# Patient Record
Sex: Female | Born: 1960 | Race: White | Hispanic: Yes | Marital: Married | Smoking: Never smoker
Health system: Southern US, Community
[De-identification: ages and names within clinical notes are randomized; demographics above are authoritative.]

## PROBLEM LIST (undated history)

## (undated) DIAGNOSIS — K219 Gastro-esophageal reflux disease without esophagitis: Secondary | ICD-10-CM

## (undated) DIAGNOSIS — D649 Anemia, unspecified: Secondary | ICD-10-CM

## (undated) DIAGNOSIS — Z531 Procedure and treatment not carried out because of patient's decision for reasons of belief and group pressure: Secondary | ICD-10-CM

## (undated) DIAGNOSIS — G709 Myoneural disorder, unspecified: Secondary | ICD-10-CM

## (undated) DIAGNOSIS — R112 Nausea with vomiting, unspecified: Secondary | ICD-10-CM

## (undated) DIAGNOSIS — IMO0001 Reserved for inherently not codable concepts without codable children: Secondary | ICD-10-CM

## (undated) DIAGNOSIS — E119 Type 2 diabetes mellitus without complications: Secondary | ICD-10-CM

## (undated) DIAGNOSIS — I10 Essential (primary) hypertension: Secondary | ICD-10-CM

## (undated) DIAGNOSIS — T7840XA Allergy, unspecified, initial encounter: Secondary | ICD-10-CM

## (undated) DIAGNOSIS — Z9889 Other specified postprocedural states: Secondary | ICD-10-CM

## (undated) DIAGNOSIS — E785 Hyperlipidemia, unspecified: Secondary | ICD-10-CM

## (undated) HISTORY — DX: Nausea with vomiting, unspecified: Z98.890

## (undated) HISTORY — DX: Essential (primary) hypertension: I10

## (undated) HISTORY — DX: Anemia, unspecified: D64.9

## (undated) HISTORY — DX: Reserved for inherently not codable concepts without codable children: IMO0001

## (undated) HISTORY — PX: COLONOSCOPY: SHX174

## (undated) HISTORY — PX: APPENDECTOMY: SHX54

## (undated) HISTORY — DX: Other specified postprocedural states: R11.2

## (undated) HISTORY — DX: Gastro-esophageal reflux disease without esophagitis: K21.9

## (undated) HISTORY — DX: Myoneural disorder, unspecified: G70.9

## (undated) HISTORY — DX: Allergy, unspecified, initial encounter: T78.40XA

## (undated) HISTORY — DX: Procedure and treatment not carried out because of patient's decision for reasons of belief and group pressure: Z53.1

## (undated) HISTORY — PX: POLYPECTOMY: SHX149

## (undated) HISTORY — DX: Hyperlipidemia, unspecified: E78.5

---

## 2007-04-24 ENCOUNTER — Emergency Department (HOSPITAL_COMMUNITY): Admission: EM | Admit: 2007-04-24 | Discharge: 2007-04-24 | Payer: Self-pay | Admitting: Emergency Medicine

## 2007-08-07 ENCOUNTER — Ambulatory Visit (HOSPITAL_COMMUNITY): Admission: RE | Admit: 2007-08-07 | Discharge: 2007-08-07 | Payer: Self-pay | Admitting: Internal Medicine

## 2009-09-07 ENCOUNTER — Ambulatory Visit (HOSPITAL_COMMUNITY): Admission: RE | Admit: 2009-09-07 | Discharge: 2009-09-07 | Payer: Self-pay | Admitting: Geriatric Medicine

## 2010-07-04 ENCOUNTER — Other Ambulatory Visit: Payer: Self-pay | Admitting: Obstetrics and Gynecology

## 2010-07-04 ENCOUNTER — Encounter (INDEPENDENT_AMBULATORY_CARE_PROVIDER_SITE_OTHER): Payer: Self-pay | Admitting: Obstetrics and Gynecology

## 2010-07-04 DIAGNOSIS — N949 Unspecified condition associated with female genital organs and menstrual cycle: Secondary | ICD-10-CM

## 2010-07-05 NOTE — Progress Notes (Signed)
NAME:  Carmen Burns, Carmen Burns NO.:  [id number]  MEDICAL RECORD NO.:  [id number]           PATIENT TYPE:  A  LOCATION:  [facility]                   FACILITY:  [facility]  PHYSICIAN:  Argentina Donovan, MD        DATE OF BIRTH:  [birth year]  DATE OF SERVICE:  07/04/2010                                 CLINIC NOTE  The patient is a 50 year old gravida 5, para 3-0-2-3, youngest child is 79.  Over the past 6 years, she has had increasingly heavy periods.  No bleeding in between the periods but she does pass clots and they last for 6 days.  She had a recent Pap smear at the cancer center which was negative and they referred her over here because of the menorrhagia and the dysfunctional uterine bleeding.  She has no other medical complaints, so we are going to get a CBC on her, get an ultrasound and then probably treat her hormonally.  She is pretty close to menopause at the present time.  IMPRESSION:  Dysfunctional uterine bleeding, perimenopausal, normal Pap smears, rule out anemia.          ______________________________ Argentina Donovan, MD    PR/MEDQ  D:  07/04/2010  T:  07/05/2010  Job:  [id number]

## 2010-07-09 ENCOUNTER — Ambulatory Visit (HOSPITAL_COMMUNITY)
Admission: RE | Admit: 2010-07-09 | Discharge: 2010-07-09 | Disposition: A | Discharge status: Home / Self Care | Payer: Self-pay | Source: Ambulatory Visit | Attending: Obstetrics and Gynecology | Admitting: Obstetrics and Gynecology

## 2010-07-09 ENCOUNTER — Ambulatory Visit (HOSPITAL_COMMUNITY): Payer: Self-pay

## 2010-07-09 DIAGNOSIS — N938 Other specified abnormal uterine and vaginal bleeding: Secondary | ICD-10-CM | POA: Insufficient documentation

## 2010-07-09 DIAGNOSIS — N949 Unspecified condition associated with female genital organs and menstrual cycle: Secondary | ICD-10-CM | POA: Insufficient documentation

## 2010-07-19 ENCOUNTER — Ambulatory Visit (INDEPENDENT_AMBULATORY_CARE_PROVIDER_SITE_OTHER): Payer: Self-pay | Admitting: Obstetrics and Gynecology

## 2010-07-19 DIAGNOSIS — N949 Unspecified condition associated with female genital organs and menstrual cycle: Secondary | ICD-10-CM

## 2010-07-19 DIAGNOSIS — L0293 Carbuncle, unspecified: Secondary | ICD-10-CM

## 2010-07-19 DIAGNOSIS — L0292 Furuncle, unspecified: Secondary | ICD-10-CM

## 2010-07-20 NOTE — Group Therapy Note (Signed)
NAME:  Carmen Burns, Carmen Burns NO.:  [id number]  MEDICAL RECORD NO.:  [id number]           PATIENT TYPE:  A  LOCATION:  [facility]                   FACILITY:  [facility]  PHYSICIAN:  Argentina Donovan, MD        DATE OF BIRTH:  1960-06-07  DATE OF SERVICE:  07/19/2010                                 CLINIC NOTE  This a followup visit on the patient who had been sent by the [facility] because of heavy bleeding.  She had had long periods of heavy bleeding, 6 days every month prolonged period of time.  An ultrasound showed that she had adenomyosis.  No sign of any fibroids and hemoglobin turned out to be 7.1.  We placed the patient on iron which she never picked up, said she never gotten in reach of the prescription.  We have copies of the prescription as well as the notice that she was sent a certified letter that we do not have the report back yet, so we impressed on her and her son here that it was necessary that she start on iron therapy.  We are also going to start her on Depo-Provera to see whether that will control the bleeding and she is so close to menopause. She is not bleeding between periods but the periods are still very very heavy.  If that works, I am going to have her come back in a couple months and see and then we will continue giving her the Depo-Provera if we can buy time to menopause.  It also gives a chance to evaluate the amount of hemoglobin; however, her hemoglobin has improved over the period time.  If not, I told her if she starts continual bleeding during the time she has had the Depo-Provera and I have discussed with her son, to bring her back in here earlier so that we can schedule some type of surgical procedure to control the problem.  IMPRESSION:  Dysfunctional uterine bleeding with secondary anemia.  The endometrium is normal for someone who is premenopausal; however, I would do an endometrial biopsy if bleeding continues and in spite of the  Depo- Provera and before surgery scheduled.          ______________________________ Argentina Donovan, MD    PR/MEDQ  D:  07/19/2010  T:  07/20/2010  Job:  [id number]

## 2010-08-22 ENCOUNTER — Other Ambulatory Visit: Payer: Self-pay | Admitting: Obstetrics & Gynecology

## 2010-08-22 ENCOUNTER — Other Ambulatory Visit (HOSPITAL_COMMUNITY)
Admission: RE | Admit: 2010-08-22 | Discharge: 2010-08-22 | Disposition: A | Discharge status: Home / Self Care | Payer: Self-pay | Source: Ambulatory Visit | Attending: Obstetrics and Gynecology | Admitting: Obstetrics and Gynecology

## 2010-08-22 ENCOUNTER — Ambulatory Visit (INDEPENDENT_AMBULATORY_CARE_PROVIDER_SITE_OTHER): Payer: Self-pay | Admitting: Obstetrics & Gynecology

## 2010-08-22 DIAGNOSIS — N949 Unspecified condition associated with female genital organs and menstrual cycle: Secondary | ICD-10-CM

## 2010-08-22 DIAGNOSIS — N938 Other specified abnormal uterine and vaginal bleeding: Secondary | ICD-10-CM | POA: Insufficient documentation

## 2010-08-22 DIAGNOSIS — D649 Anemia, unspecified: Secondary | ICD-10-CM

## 2010-08-22 LAB — POCT PREGNANCY, URINE: Preg Test, Ur: NEGATIVE

## 2010-08-23 NOTE — Group Therapy Note (Signed)
NAME:  Carmen Burns, Carmen Burns             ACCOUNT NO.:  [id number]  MEDICAL RECORD NO.:  [id number]           PATIENT TYPE:  A  LOCATION:  [facility]                   [facility]ACILITY:  [facility]  PHYSICIAN:  Allie Bossier, MD        DATE OF BIRTH:  [birth year]  DATE OF SERVICE:  08/22/2010                                 CLINIC NOTE  Ms. Roselle Locus is a 50 year old Hispanic G5, P3-0-2-3 whose younger child is 48 years of age.  She was initially referred [facility].  At that time, she told them that she had very heavy bleeding.  An ultrasound was done after a hemoglobin was 7.1.  The ultrasound showed the uterus measuring 11.4 x 7.7 x 8.4 cm, it is enlarged and globular and is consistent with adenomyosis.  There were no fibroids noted.  The endometrium is measuring 8.8 mm.  She had her hemoglobin repeated on July 19, 2010, and it was stable at 7.0.  She was given an injection of Depo-Provera at that time.  She tells me today, a month later, that her bleeding has essentially stopped, so today I have done an endometrial biopsy.  I prepped her cervix with iodine, grasped the anterior lip of the cervix with single-tooth tenaculum, and passed the [place] for 2 passes getting a large amount of tissue each time.  She tolerated the procedure well and there was no bleeding once I removed the tenaculum.  I will plan to have her come back in 2 weeks for her results.  If her bleeding remains stable and her endometrial biopsy is negative, I would continue Depo- Provera until menopause should come soon; however, if any other treatment will be based on the endometrial biopsy.     Allie Bossier, MD    MCD/MEDQ  D:  08/22/2010  T:  08/23/2010  Job:  [id number]

## 2010-09-07 ENCOUNTER — Ambulatory Visit (INDEPENDENT_AMBULATORY_CARE_PROVIDER_SITE_OTHER): Payer: Self-pay | Admitting: Obstetrics & Gynecology

## 2010-09-07 DIAGNOSIS — N920 Excessive and frequent menstruation with regular cycle: Secondary | ICD-10-CM

## 2010-09-08 NOTE — Group Therapy Note (Unsigned)
NAME:  Carmen Burns, Carmen Burns NO.:  [id number]  MEDICAL RECORD NO.:  [id number]           PATIENT TYPE:  A  LOCATION[facility]                   [facility]ACILITY[facility]  [facility]  PHYSICIAN:  Scheryl Darter, MD       DATE OF BIRTH:  1961-03-10  DATE OF SERVICE:  09/07/2010                                 CLINIC NOTE  The patient comes today for a followup for endometrial biopsy that was done on Aug 22, 2010.  The patient is 50 years old gravida 6, para 3-1-2- 3.  Last menstrual period began May 24.  She had biopsy done on May 30. She says she still has some bleeding, but is not heavy.  She received Depo-Provera on April 26.  Endometrial biopsy showed benign inactive endometrium with pseudo-decidualized stroma.  Pap test on May 07, 2010 was negative.  I discussed the results of her ultrasound and biopsy.  As this seems to be minimal pathology except for dysfunctional bleeding due to perimenopausal, I think that is best to try to manage her bleeding with hormones.  She will continue with Depo-Provera 150 mg IM every 3 months.  May decide to discontinue this after a year of therapy to see whether she is menopausal at that time.  Because she is having some bleeding, I will try to help stop the bleeding by adding oral Provera 5 mg daily until it is time for her next injection.  She will notify us if her symptoms worsen.     Scheryl Darter, MD    JA/MEDQ  D:  09/07/2010  T:  09/08/2010  Job:  [id number]

## 2010-09-19 ENCOUNTER — Ambulatory Visit: Payer: Self-pay | Admitting: Obstetrics and Gynecology

## 2010-10-04 ENCOUNTER — Ambulatory Visit (INDEPENDENT_AMBULATORY_CARE_PROVIDER_SITE_OTHER): Payer: Self-pay | Admitting: Obstetrics and Gynecology

## 2010-10-04 VITALS — BP 158/80 | HR 86

## 2010-10-04 DIAGNOSIS — Z3042 Encounter for surveillance of injectable contraceptive: Secondary | ICD-10-CM

## 2010-10-04 DIAGNOSIS — Z3049 Encounter for surveillance of other contraceptives: Secondary | ICD-10-CM

## 2010-10-04 MED ORDER — MEDROXYPROGESTERONE ACETATE 150 MG/ML IM SUSP
150.0000 mg | Freq: Once | INTRAMUSCULAR | Status: AC
  Administered 2010-10-04: 150 mg via INTRAMUSCULAR

## 2010-10-04 NOTE — Progress Notes (Signed)
Pt here for depo provera.

## 2010-12-13 LAB — COMPREHENSIVE METABOLIC PANEL
ALT: 38 — ABNORMAL HIGH
AST: 84 — ABNORMAL HIGH
Albumin: 3.2 — ABNORMAL LOW
Alkaline Phosphatase: 125 — ABNORMAL HIGH
Calcium: 9.4
Creatinine, Ser: 1.03
GFR calc non Af Amer: 57 — ABNORMAL LOW

## 2010-12-13 LAB — URINE MICROSCOPIC-ADD ON

## 2010-12-13 LAB — DIFFERENTIAL
Basophils Absolute: 0.2 — ABNORMAL HIGH
Basophils Relative: 1
Eosinophils Absolute: 0.2
Lymphs Abs: 4.7 — ABNORMAL HIGH
Monocytes Absolute: 1
Neutrophils Relative %: 65

## 2010-12-13 LAB — URINALYSIS, ROUTINE W REFLEX MICROSCOPIC
Glucose, UA: NEGATIVE
Ketones, ur: NEGATIVE
Urobilinogen, UA: 0.2
pH: 5.5

## 2010-12-13 LAB — POCT PREGNANCY, URINE: Operator id: 294511

## 2010-12-13 LAB — OCCULT BLOOD X 1 CARD TO LAB, STOOL: Fecal Occult Bld: NEGATIVE

## 2010-12-13 LAB — LIPASE, BLOOD: Lipase: 22

## 2010-12-17 ENCOUNTER — Encounter: Payer: Self-pay | Admitting: *Deleted

## 2010-12-17 DIAGNOSIS — N938 Other specified abnormal uterine and vaginal bleeding: Secondary | ICD-10-CM | POA: Insufficient documentation

## 2010-12-17 DIAGNOSIS — D649 Anemia, unspecified: Secondary | ICD-10-CM | POA: Insufficient documentation

## 2010-12-17 DIAGNOSIS — E669 Obesity, unspecified: Secondary | ICD-10-CM

## 2010-12-20 ENCOUNTER — Encounter: Payer: Self-pay | Admitting: Obstetrics & Gynecology

## 2010-12-20 ENCOUNTER — Ambulatory Visit: Payer: Self-pay

## 2010-12-26 ENCOUNTER — Other Ambulatory Visit: Payer: Self-pay | Admitting: Obstetrics and Gynecology

## 2010-12-26 DIAGNOSIS — Z1231 Encounter for screening mammogram for malignant neoplasm of breast: Secondary | ICD-10-CM

## 2011-01-03 ENCOUNTER — Ambulatory Visit (HOSPITAL_COMMUNITY)
Admission: RE | Admit: 2011-01-03 | Discharge: 2011-01-03 | Disposition: A | Discharge status: Home / Self Care | Payer: Self-pay | Source: Ambulatory Visit | Attending: Obstetrics and Gynecology | Admitting: Obstetrics and Gynecology

## 2011-01-03 DIAGNOSIS — Z1231 Encounter for screening mammogram for malignant neoplasm of breast: Secondary | ICD-10-CM

## 2011-03-26 DIAGNOSIS — D649 Anemia, unspecified: Secondary | ICD-10-CM

## 2011-03-26 HISTORY — DX: Anemia, unspecified: D64.9

## 2012-04-30 ENCOUNTER — Ambulatory Visit: Payer: Self-pay | Admitting: Internal Medicine

## 2012-04-30 ENCOUNTER — Encounter (HOSPITAL_COMMUNITY): Payer: Self-pay

## 2012-04-30 ENCOUNTER — Inpatient Hospital Stay (HOSPITAL_COMMUNITY)
Admission: EM | Admit: 2012-04-30 | Discharge: 2012-05-01 | DRG: 812 | Disposition: A | Discharge status: Home / Self Care | Payer: MEDICAID | Attending: Family Medicine | Admitting: Family Medicine

## 2012-04-30 VITALS — BP 128/70 | HR 95 | Temp 98.1°F | Resp 18 | Ht 63.0 in | Wt 198.2 lb

## 2012-04-30 DIAGNOSIS — E119 Type 2 diabetes mellitus without complications: Secondary | ICD-10-CM

## 2012-04-30 DIAGNOSIS — D500 Iron deficiency anemia secondary to blood loss (chronic): Secondary | ICD-10-CM | POA: Diagnosis present

## 2012-04-30 DIAGNOSIS — N8003 Adenomyosis of the uterus: Secondary | ICD-10-CM

## 2012-04-30 DIAGNOSIS — E669 Obesity, unspecified: Secondary | ICD-10-CM

## 2012-04-30 DIAGNOSIS — R55 Syncope and collapse: Secondary | ICD-10-CM

## 2012-04-30 DIAGNOSIS — I10 Essential (primary) hypertension: Secondary | ICD-10-CM | POA: Diagnosis present

## 2012-04-30 DIAGNOSIS — D62 Acute posthemorrhagic anemia: Principal | ICD-10-CM

## 2012-04-30 DIAGNOSIS — Z880 Allergy status to penicillin: Secondary | ICD-10-CM

## 2012-04-30 DIAGNOSIS — Z79899 Other long term (current) drug therapy: Secondary | ICD-10-CM

## 2012-04-30 DIAGNOSIS — N938 Other specified abnormal uterine and vaginal bleeding: Secondary | ICD-10-CM

## 2012-04-30 DIAGNOSIS — R42 Dizziness and giddiness: Secondary | ICD-10-CM

## 2012-04-30 DIAGNOSIS — E611 Iron deficiency: Secondary | ICD-10-CM

## 2012-04-30 DIAGNOSIS — D509 Iron deficiency anemia, unspecified: Secondary | ICD-10-CM

## 2012-04-30 DIAGNOSIS — N920 Excessive and frequent menstruation with regular cycle: Secondary | ICD-10-CM | POA: Diagnosis present

## 2012-04-30 DIAGNOSIS — N8000 Endometriosis of the uterus, unspecified: Secondary | ICD-10-CM

## 2012-04-30 DIAGNOSIS — N949 Unspecified condition associated with female genital organs and menstrual cycle: Secondary | ICD-10-CM

## 2012-04-30 DIAGNOSIS — E785 Hyperlipidemia, unspecified: Secondary | ICD-10-CM | POA: Diagnosis present

## 2012-04-30 DIAGNOSIS — IMO0001 Reserved for inherently not codable concepts without codable children: Secondary | ICD-10-CM

## 2012-04-30 DIAGNOSIS — R531 Weakness: Secondary | ICD-10-CM

## 2012-04-30 DIAGNOSIS — K921 Melena: Secondary | ICD-10-CM

## 2012-04-30 DIAGNOSIS — Z531 Procedure and treatment not carried out because of patient's decision for reasons of belief and group pressure: Secondary | ICD-10-CM

## 2012-04-30 DIAGNOSIS — Z7189 Other specified counseling: Secondary | ICD-10-CM

## 2012-04-30 DIAGNOSIS — R110 Nausea: Secondary | ICD-10-CM

## 2012-04-30 DIAGNOSIS — D649 Anemia, unspecified: Secondary | ICD-10-CM

## 2012-04-30 HISTORY — DX: Type 2 diabetes mellitus without complications: E11.9

## 2012-04-30 LAB — POCT I-STAT, CHEM 8
Calcium, Ion: 1.23 mmol/L (ref 1.12–1.23)
Chloride: 107 mEq/L (ref 96–112)
Glucose, Bld: 151 mg/dL — ABNORMAL HIGH (ref 70–99)
HCT: 25 % — ABNORMAL LOW (ref 36.0–46.0)
TCO2: 21 mmol/L (ref 0–100)

## 2012-04-30 LAB — TYPE AND SCREEN
ABO/RH(D): O POS
Antibody Screen: NEGATIVE

## 2012-04-30 LAB — POCT CBC
Hemoglobin: 6.9 g/dL — AB (ref 12.2–16.2)
Lymph, poc: 3.6 — AB (ref 0.6–3.4)
MID (cbc): 0.7 (ref 0–0.9)
POC Granulocyte: 7.9 — AB (ref 2–6.9)
RDW, POC: 22.4 %
WBC: 12.2 10*3/uL — AB (ref 4.6–10.2)

## 2012-04-30 LAB — URINALYSIS, ROUTINE W REFLEX MICROSCOPIC
Bilirubin Urine: NEGATIVE
Leukocytes, UA: NEGATIVE
Nitrite: NEGATIVE
Specific Gravity, Urine: 1.006 (ref 1.005–1.030)
Urobilinogen, UA: 0.2 mg/dL (ref 0.0–1.0)

## 2012-04-30 LAB — POCT URINALYSIS DIPSTICK
Glucose, UA: NEGATIVE
Protein, UA: 30

## 2012-04-30 LAB — CBC WITH DIFFERENTIAL/PLATELET
Basophils Relative: 1 % (ref 0–1)
Eosinophils Absolute: 0.1 10*3/uL (ref 0.0–0.7)
Eosinophils Relative: 1 % (ref 0–5)
Hemoglobin: 6.3 g/dL — CL (ref 12.0–15.0)
Lymphs Abs: 2.9 10*3/uL (ref 0.7–4.0)
MCHC: 27.5 g/dL — ABNORMAL LOW (ref 30.0–36.0)
MCV: 63.6 fL — ABNORMAL LOW (ref 78.0–100.0)
Neutro Abs: 7.6 10*3/uL (ref 1.7–7.7)
Neutrophils Relative %: 67 % (ref 43–77)
Platelets: 473 10*3/uL — ABNORMAL HIGH (ref 150–400)
RDW: 21.1 % — ABNORMAL HIGH (ref 11.5–15.5)
WBC: 11.4 10*3/uL — ABNORMAL HIGH (ref 4.0–10.5)

## 2012-04-30 LAB — CBC
HCT: 20.3 % — ABNORMAL LOW (ref 36.0–46.0)
Hemoglobin: 5.8 g/dL — CL (ref 12.0–15.0)
MCH: 18.1 pg — ABNORMAL LOW (ref 26.0–34.0)
MCHC: 28.6 g/dL — ABNORMAL LOW (ref 30.0–36.0)
MCV: 63.4 fL — ABNORMAL LOW (ref 78.0–100.0)
Platelets: 408 K/uL — ABNORMAL HIGH (ref 150–400)
RBC: 3.2 MIL/uL — ABNORMAL LOW (ref 3.87–5.11)
RDW: 21.1 % — ABNORMAL HIGH (ref 11.5–15.5)
WBC: 11 K/uL — ABNORMAL HIGH (ref 4.0–10.5)

## 2012-04-30 LAB — IRON AND TIBC: TIBC: 482 ug/dL — ABNORMAL HIGH (ref 250–470)

## 2012-04-30 LAB — RETICULOCYTES
RBC.: 3.54 MIL/uL — ABNORMAL LOW (ref 3.87–5.11)
Retic Ct Pct: 2.8 % (ref 0.4–3.1)

## 2012-04-30 LAB — PROTIME-INR: INR: 1.09 (ref 0.00–1.49)

## 2012-04-30 LAB — NO BLOOD PRODUCTS: Transfuse no blood products: NEGATIVE

## 2012-04-30 LAB — TROPONIN I: Troponin I: <0.3 ng/mL

## 2012-04-30 LAB — URINE MICROSCOPIC-ADD ON

## 2012-04-30 MED ORDER — ESTROGENS CONJUGATED 25 MG IJ SOLR
25.0000 mg | Freq: Once | INTRAMUSCULAR | Status: DC
  Filled 2012-04-30: qty 25

## 2012-04-30 MED ORDER — FERROUS SULFATE 325 (65 FE) MG PO TABS
325.0000 mg | ORAL_TABLET | Freq: Every day | ORAL | Status: DC
  Administered 2012-05-01: 325 mg via ORAL
  Filled 2012-04-30 (×2): qty 1

## 2012-04-30 MED ORDER — ESTROGENS CONJUGATED 1.25 MG PO TABS
2.5000 mg | ORAL_TABLET | Freq: Four times a day (QID) | ORAL | Status: DC
  Filled 2012-04-30 (×2): qty 2

## 2012-04-30 MED ORDER — SODIUM CHLORIDE 0.9 % IJ SOLN
3.0000 mL | Freq: Two times a day (BID) | INTRAMUSCULAR | Status: DC
  Administered 2012-04-30: 3 mL via INTRAVENOUS

## 2012-04-30 MED ORDER — ADULT MULTIVITAMIN W/MINERALS CH
1.0000 | ORAL_TABLET | Freq: Every day | ORAL | Status: DC
  Administered 2012-05-01: 1 via ORAL
  Filled 2012-04-30: qty 1

## 2012-04-30 MED ORDER — PROMETHAZINE HCL 25 MG/ML IJ SOLN: 12.5000 mg | Freq: Four times a day (QID) | INTRAMUSCULAR | Status: DC | PRN

## 2012-04-30 MED ORDER — MEDROXYPROGESTERONE ACETATE 10 MG PO TABS
10.0000 mg | ORAL_TABLET | Freq: Every day | ORAL | Status: DC
  Administered 2012-05-01: 10 mg via ORAL
  Filled 2012-04-30: qty 1

## 2012-04-30 MED ORDER — CEPHALEXIN 500 MG PO CAPS
500.0000 mg | ORAL_CAPSULE | Freq: Three times a day (TID) | ORAL | Status: DC
  Administered 2012-05-01 (×3): 500 mg via ORAL
  Filled 2012-04-30 (×5): qty 1

## 2012-04-30 MED ORDER — SODIUM CHLORIDE 0.9 % IV SOLN
INTRAVENOUS | Status: DC
  Administered 2012-04-30 – 2012-05-01 (×2): via INTRAVENOUS

## 2012-04-30 NOTE — ED Notes (Signed)
Pt has been having weakness and dizziness for the past 4 days; pt states she had a syncopal episode at home today while she was taking a bath; "everything got dark and I past out for about a minute"; pt has a hx of GI blood and anemia; was seen at [place] today and sent here; hemoglobin 6.9 at [facility]; pt denying any pain;

## 2012-04-30 NOTE — Progress Notes (Signed)
CRITICAL VALUE ALERT  Critical value received:  Hgb 5.8   Date of notification:  Feb. 6 2014  Time of notification:  2335  Critical value read back:yes  Nurse who received alert:  Maximino Greenland  MD notified (1st page):  Berline Chough  Time of first page:  2335  MD notified (2nd page):  Time of second page:  Responding MD:  Berline Chough  Time MD responded:  2335

## 2012-04-30 NOTE — ED Notes (Signed)
Reg diet ordered spoke with Kenney Houseman

## 2012-04-30 NOTE — ED Provider Notes (Addendum)
History     CSN: [id number]  Arrival date & time 04/30/12  1531   First MD Initiated Contact with Patient 04/30/12 1549      Chief Complaint  Patient presents with  . Weakness  . Anemia    (Consider location/radiation/quality/duration/timing/severity/associated sxs/prior treatment) HPI Comments: 52 year old female presents with a complaint of being weak and dizzy for the past 4 days. Gradually getting worse, this morning while taking a shower when she stood up she became lightheaded and passed out in the shower. The symptoms are worse with standing, not associated with fevers chills nausea vomiting coughing or diarrhea. She was seen at the urgent care today, told that she needed to be further evaluated for possible GI bleed. 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  Patient is a 52 y.o. female presenting with weakness and anemia. The history is provided by the patient.  Weakness  Additional symptoms include weakness.  Anemia    Past Medical History  Diagnosis Date  . Hypertension   . Hyperlipidemia   . Diabetes mellitus without complication     History reviewed. No pertinent past surgical history.  Family History  Problem Relation Age of Onset  . Diabetes Brother   . Hypertension Brother     History  Substance Use Topics  . Smoking status: Never Smoker   . Smokeless tobacco: Not on file  . Alcohol Use: Not on file    OB History    Grav Para Term Preterm Abortions TAB SAB Ect Mult Living   5 3 3  0 2   0 0 3      Review of Systems  Neurological: Positive for weakness.  All other systems reviewed and are negative.    Allergies  Penicillins  Home Medications   Current Outpatient Rx  Name  Route  Sig  Dispense  Refill  . CEPHALEXIN 500 MG PO CAPS   Oral   Take 500 mg by mouth 3 (three) times daily. 10 day course filled 04/06/2012         . FERROUS SULFATE 325 (65 FE) MG PO  TABS   Oral   Take 325 mg by mouth daily with breakfast.         . GLIMEPIRIDE 2 MG PO TABS   Oral   Take 2 mg by mouth daily before breakfast.         . LISINOPRIL-HYDROCHLOROTHIAZIDE 20-25 MG PO TABS   Oral   Take 1 tablet by mouth daily.         . ADULT MULTIVITAMIN W/MINERALS CH   Oral   Take 1 tablet by mouth daily.         Marland Kitchen NAPROXEN SODIUM 220 MG PO TABS   Oral   Take 440 mg by mouth daily as needed. For pain           BP 130/57  Pulse 99  Temp 98.5 F (36.9 C) (Oral)  Resp 14  SpO2 100%  LMP 04/20/2012  Physical Exam  Nursing note and vitals reviewed. Constitutional: She appears well-developed and well-nourished. No distress.  HENT:  Head: Normocephalic and atraumatic.  Mouth/Throat: Oropharynx is clear and moist. No oropharyngeal exudate.  Eyes: EOM are normal. Pupils are equal, round, and reactive to light. Right eye exhibits no discharge. Left eye exhibits no discharge. No scleral icterus.       Pale conjunctiva  Neck: Normal range of motion.  Neck supple. No JVD present. No thyromegaly present.  Cardiovascular: Normal rate, regular rhythm, normal heart sounds and intact distal pulses.  Exam reveals no gallop and no friction rub.   No murmur heard. Pulmonary/Chest: Effort normal and breath sounds normal. No respiratory distress. She has no wheezes. She has no rales.  Abdominal: Soft. Bowel sounds are normal. She exhibits no distension and no mass. There is no tenderness.  Genitourinary:       Chaperone present for rectal exam, greenish brown-appearing stool, no gross blood, no fissures, no hemorrhoids, no masses palpated  Musculoskeletal: Normal range of motion. She exhibits no edema and no tenderness.  Lymphadenopathy:    She has no cervical adenopathy.  Neurological: She is alert. Coordination normal.  Skin: Skin is warm and dry. No rash noted. No erythema.  Psychiatric: She has a normal mood and affect. Her behavior is normal.    ED Course   Procedures (including critical care time)  Labs Reviewed  CBC WITH DIFFERENTIAL - Abnormal; Notable for the following:    WBC 11.4 (*)     RBC 3.60 (*)     Hemoglobin 6.3 (*)     HCT 22.9 (*)     MCV 63.6 (*)     MCH 17.5 (*)     MCHC 27.5 (*)     RDW 21.1 (*)     Platelets 473 (*)     All other components within normal limits  URINALYSIS, ROUTINE W REFLEX MICROSCOPIC - Abnormal; Notable for the following:    Hgb urine dipstick LARGE (*)     All other components within normal limits  POCT I-STAT, CHEM 8 - Abnormal; Notable for the following:    Glucose, Bld 151 (*)     Hemoglobin 8.5 (*)     HCT 25.0 (*)     All other components within normal limits  URINE MICROSCOPIC-ADD ON - Abnormal; Notable for the following:    Squamous Epithelial / LPF FEW (*)     All other components within normal limits  RETICULOCYTES - Abnormal; Notable for the following:    RBC. 3.54 (*)     All other components within normal limits  TYPE AND SCREEN  APTT  PROTIME-INR  VITAMIN B12  FOLATE  IRON AND TIBC  FERRITIN   No results found.   1. Anemia       MDM  Patient reexamined, Hemoccult is negative on my exam. Hemoglobin is been rechecked and is 6.3. The patient will require admission to the hospital for further anemic workup and evaluation. She is a Scientist, product/process development and refuses transfusion. Family practice physicians have been notified and will come to admit.  Otherwise the labs show a slight leukocytosis of 11,400, normal metabolic panel including renal function and electrolytes.      Vida Roller, MD 04/30/12 1743  Vida Roller, MD 04/30/12 [phone]

## 2012-04-30 NOTE — Progress Notes (Signed)
Subjective:    Patient ID: Carmen Burns, female    DOB: [birth year], 52 y.o.   MRN: [id number]  HPI Presents with nausea, dizzy, weakness, near syncope. On new meds from another practise for diabetes. Phx 2009 anemia and dysfunctional bleeding. Diabetes is controlled.  Review of Systems     Objective:   Physical Exam  Constitutional: She is oriented to person, place, and time. She appears well-developed and well-nourished. She appears distressed.  Eyes: EOM are normal. No scleral icterus.  Neck: Neck supple.  Cardiovascular: Normal pulses.  A regularly irregular rhythm present. Tachycardia present.   Pulmonary/Chest: Effort normal and breath sounds normal.  Abdominal: Soft. Bowel sounds are normal. She exhibits no mass. There is tenderness.  Musculoskeletal: Normal range of motion.  Neurological: She is alert and oriented to person, place, and time.  Psychiatric: She has a normal mood and affect.  Very pale. EKG sinus tachycardia  Results for orders placed in visit on 04/30/12  POCT CBC      Component Value Range   WBC 12.2 (*) 4.6 - 10.2 K/uL   Lymph, poc 3.6 (*) 0.6 - 3.4   POC LYMPH PERCENT 29.4  10 - 50 %L   MID (cbc) 0.7  0 - 0.9   POC MID % 5.6  0 - 12 %M   POC Granulocyte 7.9 (*) 2 - 6.9   Granulocyte percent 65.0  37 - 80 %G   RBC 3.98 (*) 4.04 - 5.48 M/uL   Hemoglobin 6.9 (*) 12.2 - 16.2 g/dL   HCT, POC 78.2 (*) 95.6 - 47.9 %   MCV 60.0 (*) 80 - 97 fL   MCH, POC 17.3 (*) 27 - 31.2 pg   MCHC 28.9 (*) 31.8 - 35.4 g/dL   RDW, POC 21.3     Platelet Count, POC 516 (*) 142 - 424 K/uL   MPV    0 - 99.8 fL  GLUCOSE, POCT (MANUAL RESULT ENTRY)      Component Value Range   POC Glucose 126 (*) 70 - 99 mg/dl  POCT GLYCOSYLATED HEMOGLOBIN (HGB A1C)      Component Value Range   Hemoglobin A1C 6.0     Results for orders placed in visit on 04/30/12  POCT CBC      Component Value Range   WBC 12.2 (*) 4.6 - 10.2 K/uL   Lymph, poc 3.6 (*) 0.6 - 3.4   POC LYMPH  PERCENT 29.4  10 - 50 %L   MID (cbc) 0.7  0 - 0.9   POC MID % 5.6  0 - 12 %M   POC Granulocyte 7.9 (*) 2 - 6.9   Granulocyte percent 65.0  37 - 80 %G   RBC 3.98 (*) 4.04 - 5.48 M/uL   Hemoglobin 6.9 (*) 12.2 - 16.2 g/dL   HCT, POC 08.6 (*) 57.8 - 47.9 %   MCV 60.0 (*) 80 - 97 fL   MCH, POC 17.3 (*) 27 - 31.2 pg   MCHC 28.9 (*) 31.8 - 35.4 g/dL   RDW, POC 46.9     Platelet Count, POC 516 (*) 142 - 424 K/uL   MPV    0 - 99.8 fL  GLUCOSE, POCT (MANUAL RESULT ENTRY)      Component Value Range   POC Glucose 126 (*) 70 - 99 mg/dl  POCT GLYCOSYLATED HEMOGLOBIN (HGB A1C)      Component Value Range   Hemoglobin A1C 6.0    POCT URINALYSIS DIPSTICK  Component Value Range   Color, UA amber     Clarity, UA cloudy     Glucose, UA neg     Bilirubin, UA small     Ketones, UA trace     Spec Grav, UA 1.015     Blood, UA large     pH, UA 5.5     Protein, UA 30     Urobilinogen, UA 0.2     Nitrite, UA neg     Leukocytes, UA Trace    IFOBT (OCCULT BLOOD)      Component Value Range   IFOBT Positive     Start IV NS/to ER     Assessment & Plan:  Severe anemia and blood in stool Refer to [facility] ER

## 2012-04-30 NOTE — H&P (Signed)
Family Medicine Teaching [facility] H&P Service Pager: [phone]  Patient name: Carmen Burns Medical record number: [id number] Date of birth: 02-Aug-1960 Age: 52 y.o. Gender: female  Primary Care Provider: Default, Provider, MD - Unassigned Consultants: None   CC  Syncope  HPI  Carmen Burns is a 52 y.o. year old female presenting with acute episode of syncope.  She was reportedly at home today and stood up from sitting and passed out for ~1 minute.  Her son took her to Urgent Care where she was found to have a hemoglobin of 6.3.  She reported having heavy menstrual bleeding X 2-3 years that seems to have worsened over the past 2-3 months and has most recently begun 4 days ago.  At the beginning of her period she reports having to change her pad every couple of minutes and is passing large clots.  Reports currently bleeding but has slowed significantly since 2 days ago. Initially patient indicated that her bleeding  was more significant however upon requestioning via interpreter and Dr. Mauricio Po patient did clarify that her bleeding has significantly slowed is almost stopped today on its own.  She has previously been worked up for this at [facility] and was told that she should wait until after menopause to have a hysterectomy.  She was on DEPO-Provera but has stopped ~ 1.5 years ago and has not been on hormone medications since that time.    ROS   Constitutional Fatigue, dizziness, no change in appetitie  Infectious No fevers, no chills  Resp No cough, no congestion, some shortness of breath associated with syncope episode  Cardiac Some chest tightness with syncope episode.  None now.  No palpitations, no LE swelling  GI No vomting, but some N with syncope.  No hematachezia or melana  GU No dysuria, no hematuria.  Heavy menses as above  MSK No focal weakness  Trauma Fall today, no reported trauma    HISTORY  PMHx:  Past Medical History  Diagnosis Date  . Hypertension   .  Hyperlipidemia   . Diabetes mellitus without complication     PSHx: History reviewed. No pertinent past surgical history.  Social Hx: History   Social History  . Marital Status: Married    Spouse Name: N/A    Number of Children: N/A  . Years of Education: N/A   Social History Main Topics  . Smoking status: Never Smoker   . Smokeless tobacco: None  . Alcohol Use: None  . Drug Use: None  . Sexually Active: None   Other Topics Concern  . None   Social History Narrative  . None    Family Hx: Family History  Problem Relation Age of Onset  . Diabetes Brother   . Hypertension Brother     Allergies: Allergies  Allergen Reactions  . Penicillins Rash    Home Medications: Prescriptions prior to admission  Medication Sig Dispense Refill  . cephALEXin (KEFLEX) 500 MG capsule Take 500 mg by mouth 3 (three) times daily. 10 day course filled 04/06/2012      . ferrous sulfate 325 (65 FE) MG tablet Take 325 mg by mouth daily with breakfast.      . glimepiride (AMARYL) 2 MG tablet Take 2 mg by mouth daily before breakfast.      . lisinopril-hydrochlorothiazide (PRINZIDE,ZESTORETIC) 20-25 MG per tablet Take 1 tablet by mouth daily.      . Multiple Vitamin (MULTIVITAMIN WITH MINERALS) TABS Take 1 tablet by mouth daily.      Marland Kitchen  naproxen sodium (ANAPROX) 220 MG tablet Take 440 mg by mouth daily as needed. For pain        OBJECTIVE  Vitals: Temp:  [98.1 F (36.7 C)-99.5 F (37.5 C)] 99.5 F (37.5 C) (02/06 2211) Pulse Rate:  [81-107] 86  (02/06 2330) Resp:  [14-25] 14  (02/06 2330) BP: (105-152)/(53-80) 131/70 mmHg (02/06 2330) SpO2:  [98 %-100 %] 99 % (02/06 2330) Weight:  [198 lb 3.2 oz (89.903 kg)-199 lb 4.7 oz (90.4 kg)] 199 lb 4.7 oz (90.4 kg) (02/06 2211)  Weight: Wt Readings from Last 3 Encounters:  04/30/12 199 lb 4.7 oz (90.4 kg)  04/30/12 198 lb 3.2 oz (89.903 kg)    I&Os: Yesterday:   This shift: Total I/O In: 18.8 [I.V.:18.8] Out: -    PE: GENERAL:   Adult Hispanic female. In no discomfort; no respiratory distress. PSYCH: Alert and appropriately interactive; Insight:Good   H&N: AT/[place], trachea midline EENT:  MMM, no scleral icterus, EOMi HEART: RRR, S1/S2 heard, no murmur LUNGS: CTA B, no wheezes, no crackles EXTREMITIES: Moves all 4 extremities spontaneously, warm well perfused, no edema, bilateral DP and PT pulses 2/4. PELVIC EXAM: External genitalia examined.  There is only as an dark blood on pad was removed.  There is no blood clot in the vaginal introitus.  Speculum and bmanual exam is deferred    LABS: CBC BMET   Lab 04/30/12 2259 04/30/12 1649 04/30/12 1613  WBC 11.0* -- 11.4*  HGB 5.8* 8.5* 6.3*  HCT 20.3* 25.0* 22.9*  PLT 408* -- 473*    Lab 04/30/12 1649  NA 139  K 3.7  CL 107  CO2 --  BUN 9  CREATININE 0.70  GLUCOSE 151*  CALCIUM --     URINE STUDIES:  04/30/2012 16:19  Color, Urine YELLOW  APPearance CLEAR  Specific Gravity, Urine 1.006  pH 6.0  Glucose NEGATIVE  Bilirubin Urine NEGATIVE  Ketones, ur NEGATIVE  Protein NEGATIVE  Urobilinogen, UA 0.2  Nitrite NEGATIVE  Leukocytes, UA NEGATIVE  Hgb urine dipstick LARGE (A)  WBC, UA 0-2  RBC / HPF 3-6  Squamous Epithelial / LPF FEW (A)  Bacteria, UA RARE   MICRO: None  IMAGING: No results found. No results found.   Medications:       . cephALEXin  500 mg Oral TID  . conjugated estrogens  25 mg Intravenous Once  . ferrous sulfate  325 mg Oral QAC breakfast  . medroxyPROGESTERone  10 mg Oral Daily  . multivitamin with minerals  1 tablet Oral Daily  . sodium chloride  3 mL Intravenous Q12H    Assessment & Plan  LOS: 31 52 y.o. year old female with heavy menstural periods over the past 2-3 years with worsening over the past 3 months who presented s/p syncopal episode.  Found to have a hemoglobin of 6.3 on presentation.  # Dysmenorrhea:   Discussed case with Dr. Debroah Loop @ [facility]: If patient or bleeding significantly Dr. Debroah Loop does recommend  IV Premarin however upon further clarification with the patient her bleeding has significantly slowed.   will give her one dose of Premarin 2.5 mg by mouth given that her bleeding is only minimal at this time    Start Provera 10mg  po daily until able to followup with [facility].  Currently BP appropriate  # Anemia: acute on chronic blood loss due to menorrhagia  Pt is [employer] and chooses not to accept blood products  Iron studies pending - Consider FeraHeme & Aranesp  Negative Hemoccult in ED, no indication for GI bleed   # Uterine Adenomyosis:  Seen on [place] in 06/2010  Repeating US  --- FEN  *NS @ 94ml/hr -Heart healthy --- PPx: SCDs, no heparin indicated  Disposition  Pt is to be admitted to SDU for close monitoring and stabilization of her bleeding.  If patient does begin to rebleed consider IV estrogen given o blood products can be administered.  Discussed case with Dr. Debroah Loop and Dr. Mauricio Po.  Initially patient was felt to have more bleeding than what was evident on exam and upon requestioning via interpreter and Dr. Mauricio Po patient did clarify that her bleeding has significantly slowed is almost stopped today on its own.  Plan for out patient stablization   Andrena Mews, DO [facility] Resident - PGY-1 04/30/2012 11:45 PM

## 2012-04-30 NOTE — ED Notes (Signed)
Hyacinth Meeker, MD notified re: critical hemoglobin of 6.3, primary RN notified

## 2012-04-30 NOTE — ED Notes (Signed)
Pt states she would not like a blood transfusion; would like to discuss other options with MD about hemoglobin;

## 2012-05-01 ENCOUNTER — Inpatient Hospital Stay (HOSPITAL_COMMUNITY): Payer: Self-pay

## 2012-05-01 DIAGNOSIS — D509 Iron deficiency anemia, unspecified: Secondary | ICD-10-CM

## 2012-05-01 DIAGNOSIS — D62 Acute posthemorrhagic anemia: Secondary | ICD-10-CM | POA: Diagnosis present

## 2012-05-01 DIAGNOSIS — N800 Endometriosis of uterus: Secondary | ICD-10-CM | POA: Diagnosis present

## 2012-05-01 DIAGNOSIS — N8003 Adenomyosis of the uterus: Secondary | ICD-10-CM | POA: Diagnosis present

## 2012-05-01 DIAGNOSIS — Z531 Procedure and treatment not carried out because of patient's decision for reasons of belief and group pressure: Secondary | ICD-10-CM

## 2012-05-01 DIAGNOSIS — E611 Iron deficiency: Secondary | ICD-10-CM | POA: Diagnosis present

## 2012-05-01 LAB — CBC
HCT: 20.3 % — ABNORMAL LOW (ref 36.0–46.0)
HCT: 21.8 % — ABNORMAL LOW (ref 36.0–46.0)
HCT: 20.9 % — ABNORMAL LOW (ref 36.0–46.0)
Hemoglobin: 5.7 g/dL — CL (ref 12.0–15.0)
Hemoglobin: 6.1 g/dL — CL (ref 12.0–15.0)
MCH: 18 pg — ABNORMAL LOW (ref 26.0–34.0)
MCHC: 28 g/dL — ABNORMAL LOW (ref 30.0–36.0)
MCV: 64.4 fL — ABNORMAL LOW (ref 78.0–100.0)
MCV: 65.3 fL — ABNORMAL LOW (ref 78.0–100.0)
Platelets: 389 10*3/uL (ref 150–400)
RBC: 3.15 MIL/uL — ABNORMAL LOW (ref 3.87–5.11)
RDW: 22.2 % — ABNORMAL HIGH (ref 11.5–15.5)
WBC: 10.3 10*3/uL (ref 4.0–10.5)
WBC: 8.7 10*3/uL (ref 4.0–10.5)

## 2012-05-01 LAB — VITAMIN B12: Vitamin B-12: 368 pg/mL (ref 211–911)

## 2012-05-01 LAB — HEMOGLOBIN A1C
Hgb A1c MFr Bld: 6.2 % — ABNORMAL HIGH (ref <5.7)
Mean Plasma Glucose: 131 mg/dL — ABNORMAL HIGH (ref <117)

## 2012-05-01 MED ORDER — MEDROXYPROGESTERONE ACETATE 10 MG PO TABS: 10.0000 mg | ORAL_TABLET | Freq: Every day | ORAL | Status: DC

## 2012-05-01 MED ORDER — ESTROGENS CONJUGATED 1.25 MG PO TABS
2.5000 mg | ORAL_TABLET | Freq: Once | ORAL | Status: AC
  Administered 2012-05-01: 2.5 mg via ORAL
  Filled 2012-05-01: qty 2

## 2012-05-01 MED ORDER — SODIUM CHLORIDE 0.9 % IV SOLN
1020.0000 mg | Freq: Once | INTRAVENOUS | Status: AC
  Administered 2012-05-01: 1020 mg via INTRAVENOUS
  Filled 2012-05-01: qty 34

## 2012-05-01 MED ORDER — ACETAMINOPHEN 325 MG PO TABS
650.0000 mg | ORAL_TABLET | Freq: Four times a day (QID) | ORAL | Status: DC | PRN
  Administered 2012-05-01: 650 mg via ORAL
  Filled 2012-05-01: qty 2

## 2012-05-01 MED ORDER — INFLUENZA VIRUS VACC SPLIT PF IM SUSP
0.5000 mL | Freq: Once | INTRAMUSCULAR | Status: AC
  Administered 2012-05-01: 0.5 mL via INTRAMUSCULAR
  Filled 2012-05-01: qty 0.5

## 2012-05-01 NOTE — Progress Notes (Signed)
Pt discharge to home with family, discharged instructions discussed and explained to family/pt. F/u appt and care notes in spanish given to family and pt. Prescription sent electronically to [facility]. Beryle Quant

## 2012-05-01 NOTE — H&P (Signed)
FMTS Attending Admit Note Patient seen and examined by me on day of admission (Apr 30, 2012 @10 :40pm), I discussed case with Dr Berline Chough and I agree with his assessment and plan for management.  Patient interview conducted in [place].  Briefly, a 51yoF admitted after syncopal event and found to be profoundly anemic, thought secondary to heavy uncontrolled uterine bleeding.  Patient reports that she has had very heavy menstrual cycles all her life, the most recent one which started on Apr 20, 2012 required her to change pads every three minutes until tapering off significantly in recent days. She states that in the past few days she has only had to use 2 pads a day and it appears to be slowing considerably.  She denies other sources of bleeding such as hematochezia or hematuria.  She has been seen in [facility] and had some workup done there.   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  She states that it would be her preference not to undergo hysterectomy but that she would accept this if it were GYN's thought to be medically necessary.  She has maintained stable hemodynamic status; plan to admit and administer oral estrogen therapy to further stop uterine bleeding.  Serial H/H checks.  Once stopped, may consider timely outpatient eval by GYN again.  I agree with Duncan Dull, and may consider coags and workup for vWD or other disorders of coagulation if not already done previously.  Paula Compton, MD

## 2012-05-01 NOTE — Progress Notes (Signed)
Utilization review completed.  

## 2012-05-01 NOTE — Progress Notes (Signed)
Seen, examined and discussed in rounds.  Agree with Dr. Sonnenberg's documentation and management. 

## 2012-05-01 NOTE — Progress Notes (Signed)
Patient ID: Jasmia Angst, female   DOB: 09/28/1960, 52 y.o.   MRN: [id number] Family Medicine Teaching Service Daily Progress Note Service Page: [phone]  Subjective: feeling better this morning. Less dizzy. Denies chest pain. Now only bleeding a small amount. States was on the keflex for an ingrown toenail. Has one day left of this medication.  Objective: Temp:  [98.1 F (36.7 C)-99.5 F (37.5 C)] 98.7 F (37.1 C) (02/07 1112) Pulse Rate:  [81-107] 85  (02/07 0716) Resp:  [14-25] 19  (02/07 0716) BP: (105-152)/(53-80) 141/67 mmHg (02/07 0716) SpO2:  [98 %-100 %] 100 % (02/07 0716) Weight:  [198 lb 3.2 oz (89.903 kg)-199 lb 4.7 oz (90.4 kg)] 199 lb 4.7 oz (90.4 kg) (02/06 2211) Exam: General: no acute distress Cardiovascular: rrr, no mrg Respiratory: CTAB Abdomen: soft, minimal tenderness to palpation over suprapubic region Extremities: no edema, sluggish cap refill, right big toe ingrown in medial aspect  CBC BMET   Lab 05/01/12 1022 05/01/12 0515 04/30/12 2259  WBC 8.4 10.3 11.0*  HGB 6.1* 5.7* 5.8*  HCT 21.8* 20.3* 20.3*  PLT 424* 389 408*    Lab 04/30/12 1649  NA 139  K 3.7  CL 107  CO2 --  BUN 9  CREATININE 0.70  GLUCOSE 151*  CALCIUM --     Iron 16.6 TIBC 482 Ferritin 27 A1c 6.2 B12 and folate wnl  Imaging/Diagnostic Tests: Transvaginal US: Findings compatible with adenomyosis resulting in nonvisualization  of the endometrium with this study. Non-visualized ovaries.  Thin walled unilocular left adnexal cyst appears stable since the  previous exam. Given the appearance and premenopausal status of  the patient, no further follow-up for this benign finding is  needed.     . cephALEXin  500 mg Oral TID  . medroxyPROGESTERone  10 mg Oral Daily  . multivitamin with minerals  1 tablet Oral Daily  . sodium chloride  3 mL Intravenous Q12H   acetaminophen, promethazine   Assessment/Plan: 52 y.o. year old female with heavy menstural periods over the  past 2-3 years with worsening over the past 3 months who presented s/p syncopal episode. Found to have a hemoglobin of 6.3 on presentation.  1. Dysmennorhea: has significantly slowed at this time. Likely related to adenomyosis seen on [place]. -patient received premarin and provera yesterday -will continue provera 10 mg PO daily -vaginal US revealed uterine adenomyosis -will need outpatient follow-up with gynecology  2. Anemia: Hgb stable today at 5.7. Iron deficiency with low iron, high TIBC, and low ferritin. 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 -will give fereheme 1020 mg today, can consider redosing this after 3-8 days with 510 dosing -will follow up CBC  3. DM: A1c 6.2 -holding home glimeperide  4. HTN: on lisinopril/HCTZ at home, given volume depletion will hold this medication as BP is normal range  FEN/GI: NS at 75 mL/hr, heart healthy diet Dispo: pending stabilization of hgb and completion of fereheme  Marikay Alar, MD 05/01/2012, 11:24 AM

## 2012-05-02 NOTE — Discharge Summary (Signed)
Physician Discharge Summary  Patient ID: Carmen Burns MRN: [id number] DOB: 1961/03/01 Age: 52 y.o.  Admit date: 04/30/2012 Discharge date: 05/02/2012 Admitting Physician: Barbaraann Barthel, MD  PCP: Default, Provider, MD  Consultants: none    Discharge Diagnosis: Principal Problem:   Acute blood loss anemia Active Problems:   Uterus, adenomyosis   Refusal of blood transfusions as patient is Jehovah's Witness   Iron deficiency    Hospital Course 52 y.o. year old female with heavy menstural periods over the past 2-3 years with worsening over the past 3 months who presented s/p syncopal episode. Found to have a hemoglobin of 6.3 on presentation.   1. Dysmenorrhea: at the time of presentation patients bleeding had slowed significantly. Presented following an acute syncopal event following standing up from sitting. Case was discussed with Dr. Debroah Loop at [facility] who advised treatment with premarin and provera. Her Hgb on admission was 6.3. Hgb remained stable at 5.9 at time of discharge. Patient was given dose of premarin 2.5 mg and started on provera 10 mg daily. Pelvic US revealed adenomyosis. Was given a dose of fereheme prior to discharge as iron studies showed iron deficiency anemia with low iron, elevated TIBC, and low ferritin. Advised to follow-up as outpatient with [facility].  2. Anemia: Hgb remained stable throughout hospitalization. Appears to be iron deficient. Patient is a Jehovah's witness and declines blood products. Treatment as outlined above.  3. DM: A1c 6.2, held home glimeperide.   4. HTN: on lisinopril/HCTZ at home, given volume depletion held this medication as BP is normal range  Problem List 1. Dysmenorrhea  2. Adenomyosis 3. Iron deficiency anemia 4. DM 5. HTN         Discharge PE   Filed Vitals:   05/01/12 1500  BP: 130/60  Pulse: 91  Temp: 97.9 F (36.6 C)  Resp: 16   General: no acute distress  Cardiovascular: rrr, no mrg  Respiratory: CTAB   Abdomen: soft, minimal tenderness to palpation over suprapubic region  Extremities: no edema, sluggish cap refill, right big toe ingrown in medial aspect   Procedures/Imaging:  US Transvaginal Non-ob  05/01/2012  IMPRESSION: Findings compatible with adenomyosis resulting in nonvisualization of the endometrium with this study.  Non-visualized ovaries.  Thin walled unilocular left adnexal cyst appears stable since the previous exam.  Given the appearance and premenopausal status of the patient, no further follow-up for this benign finding is needed.   Original Report Authenticated By: Rhodia Albright, M.D.    Labs  CBC  Recent Labs Lab 05/01/12 0515 05/01/12 1022 05/01/12 1620  WBC 10.3 8.4 8.7  HGB 5.7* 6.1* 5.9*  HCT 20.3* 21.8* 20.9*  PLT 389 424* 403*   BMET  Recent Labs Lab 04/30/12 1649  NA 139  K 3.7  CL 107  BUN 9  CREATININE 0.70  GLUCOSE 151*   Results for orders placed during the hospital encounter of 04/30/12 (from the past 72 hour(s))  CBC WITH DIFFERENTIAL     Status: Abnormal   Collection Time    04/30/12  4:13 PM      Result Value Range   WBC 11.4 (*) 4.0 - 10.5 K/uL   RBC 3.60 (*) 3.87 - 5.11 MIL/uL   Hemoglobin 6.3 (*) 12.0 - 15.0 g/dL   Comment: REPEATED TO VERIFY     CRITICAL RESULT CALLED TO, READ BACK BY AND VERIFIED WITHNoralee Stain RN 1705 04/30/12 A BROWNING   HCT 22.9 (*) 36.0 - 46.0 %  MCV 63.6 (*) 78.0 - 100.0 fL   MCH 17.5 (*) 26.0 - 34.0 pg   MCHC 27.5 (*) 30.0 - 36.0 g/dL   RDW 47.8 (*) 29.5 - 62.1 %   Platelets 473 (*) 150 - 400 K/uL   Neutrophils Relative 67  43 - 77 %   Lymphocytes Relative 25  12 - 46 %   Monocytes Relative 6  3 - 12 %   Eosinophils Relative 1  0 - 5 %   Basophils Relative 1  0 - 1 %   Neutro Abs 7.6  1.7 - 7.7 K/uL   Lymphs Abs 2.9  0.7 - 4.0 K/uL   Monocytes Absolute 0.7  0.1 - 1.0 K/uL   Eosinophils Absolute 0.1  0.0 - 0.7 K/uL   Basophils Absolute 0.1  0.0 - 0.1 K/uL   RBC Morphology POLYCHROMASIA  PRESENT     Comment: ELLIPTOCYTES  URINALYSIS, ROUTINE W REFLEX MICROSCOPIC     Status: Abnormal   Collection Time    04/30/12  4:19 PM      Result Value Range   Color, Urine YELLOW  YELLOW   APPearance CLEAR  CLEAR   Specific Gravity, Urine 1.006  1.005 - 1.030   pH 6.0  5.0 - 8.0   Glucose, UA NEGATIVE  NEGATIVE mg/dL   Hgb urine dipstick LARGE (*) NEGATIVE   Bilirubin Urine NEGATIVE  NEGATIVE   Ketones, ur NEGATIVE  NEGATIVE mg/dL   Protein, ur NEGATIVE  NEGATIVE mg/dL   Urobilinogen, UA 0.2  0.0 - 1.0 mg/dL   Nitrite NEGATIVE  NEGATIVE   Leukocytes, UA NEGATIVE  NEGATIVE  URINE MICROSCOPIC-ADD ON     Status: Abnormal   Collection Time    04/30/12  4:19 PM      Result Value Range   Squamous Epithelial / LPF FEW (*) RARE   WBC, UA 0-2  <3 WBC/hpf   RBC / HPF 3-6  <3 RBC/hpf   Bacteria, UA RARE  RARE  TYPE AND SCREEN     Status: None   Collection Time    04/30/12  4:25 PM      Result Value Range   ABO/RH(D) O POS     Antibody Screen NEG     Sample Expiration 05/03/2012    POCT I-STAT, CHEM 8     Status: Abnormal   Collection Time    04/30/12  4:49 PM      Result Value Range   Sodium 139  135 - 145 mEq/L   Potassium 3.7  3.5 - 5.1 mEq/L   Chloride 107  96 - 112 mEq/L   BUN 9  6 - 23 mg/dL   Creatinine, Ser 3.08  0.50 - 1.10 mg/dL   Glucose, Bld 657 (*) 70 - 99 mg/dL   Calcium, Ion 8.46  9.62 - 1.23 mmol/L   TCO2 21  0 - 100 mmol/L   Hemoglobin 8.5 (*) 12.0 - 15.0 g/dL   HCT 95.2 (*) 84.1 - 32.4 %  APTT     Status: None   Collection Time    04/30/12  5:09 PM      Result Value Range   aPTT 28  24 - 37 seconds  PROTIME-INR     Status: None   Collection Time    04/30/12  5:09 PM      Result Value Range   Prothrombin Time 14.0  11.6 - 15.2 seconds   INR 1.09  0.00 - 1.49  VITAMIN B12  Status: None   Collection Time    04/30/12  5:09 PM      Result Value Range   Vitamin B-12 368  211 - 911 pg/mL  FOLATE     Status: None   Collection Time    04/30/12  5:09  PM      Result Value Range   Folate 19.4     Comment: (NOTE)     Reference Ranges            Deficient:       0.4 - 3.3 ng/mL            Indeterminate:   3.4 - 5.4 ng/mL            Normal:              > 5.4 ng/mL  IRON AND TIBC     Status: Abnormal   Collection Time    04/30/12  5:09 PM      Result Value Range   Iron 16 (*) 42 - 135 ug/dL   TIBC 981 (*) 191 - 478 ug/dL   Saturation Ratios 3 (*) 20 - 55 %   UIBC 466 (*) 125 - 400 ug/dL  FERRITIN     Status: None   Collection Time    04/30/12  5:09 PM      Result Value Range   Ferritin 27  10 - 291 ng/mL  RETICULOCYTES     Status: Abnormal   Collection Time    04/30/12  5:09 PM      Result Value Range   Retic Ct Pct 2.8  0.4 - 3.1 %   RBC. 3.54 (*) 3.87 - 5.11 MIL/uL   Retic Count, Manual 99.1  19.0 - 186.0 K/uL  NO BLOOD PRODUCTS     Status: None   Collection Time    04/30/12  6:46 PM      Result Value Range   Transfuse no blood products       Value: TRANSFUSE NO BLOOD PRODUCTS, VERIFIED BY JANELLE NEGRON RIVERA,RN  MRSA PCR SCREENING     Status: None   Collection Time    04/30/12 10:40 PM      Result Value Range   MRSA by PCR NEGATIVE  NEGATIVE   Comment:            The GeneXpert MRSA Assay (FDA     approved for NASAL specimens     only), is one component of a     comprehensive MRSA colonization     surveillance program. It is not     intended to diagnose MRSA     infection nor to guide or     monitor treatment for     MRSA infections.  TSH     Status: None   Collection Time    04/30/12 10:59 PM      Result Value Range   TSH 3.359  0.350 - 4.500 uIU/mL  TROPONIN I     Status: None   Collection Time    04/30/12 10:59 PM      Result Value Range   Troponin I <0.30  <0.30 ng/mL   Comment:            Due to the release kinetics of cTnI,     a negative result within the first hours     of the onset of symptoms does not rule out     myocardial infarction with certainty.  If myocardial infarction is still  suspected,     repeat the test at appropriate intervals.  HEMOGLOBIN A1C     Status: Abnormal   Collection Time    04/30/12 10:59 PM      Result Value Range   Hemoglobin A1C 6.2 (*) <5.7 %   Comment: (NOTE)                                                                               According to the ADA Clinical Practice Recommendations for 2011, when     HbA1c is used as a screening test:      >=6.5%   Diagnostic of Diabetes Mellitus               (if abnormal result is confirmed)     5.7-6.4%   Increased risk of developing Diabetes Mellitus     References:Diagnosis and Classification of Diabetes Mellitus,Diabetes     Care,2011,34(Suppl 1):S62-S69 and Standards of Medical Care in             Diabetes - 2011,Diabetes Care,2011,34 (Suppl 1):S11-S61.   Mean Plasma Glucose 131 (*) <117 mg/dL  CBC     Status: Abnormal   Collection Time    04/30/12 10:59 PM      Result Value Range   WBC 11.0 (*) 4.0 - 10.5 K/uL   RBC 3.20 (*) 3.87 - 5.11 MIL/uL   Hemoglobin 5.8 (*) 12.0 - 15.0 g/dL   Comment: REPEATED TO VERIFY     CRITICAL RESULT CALLED TO, READ BACK BY AND VERIFIED WITH:     B MUSIAL,RN 2333 04/30/12 D BRADLEY   HCT 20.3 (*) 36.0 - 46.0 %   MCV 63.4 (*) 78.0 - 100.0 fL   MCH 18.1 (*) 26.0 - 34.0 pg   MCHC 28.6 (*) 30.0 - 36.0 g/dL   RDW 16.1 (*) 09.6 - 04.5 %   Platelets 408 (*) 150 - 400 K/uL  TROPONIN I     Status: None   Collection Time    05/01/12  5:15 AM      Result Value Range   Troponin I <0.30  <0.30 ng/mL   Comment:            Due to the release kinetics of cTnI,     a negative result within the first hours     of the onset of symptoms does not rule out     myocardial infarction with certainty.     If myocardial infarction is still suspected,     repeat the test at appropriate intervals.  CBC     Status: Abnormal   Collection Time    05/01/12  5:15 AM      Result Value Range   WBC 10.3  4.0 - 10.5 K/uL   RBC 3.15 (*) 3.87 - 5.11 MIL/uL   Hemoglobin 5.7 (*)  12.0 - 15.0 g/dL   Comment: CRITICAL VALUE NOTED.  VALUE IS CONSISTENT WITH PREVIOUSLY REPORTED AND CALLED VALUE.   HCT 20.3 (*) 36.0 - 46.0 %   MCV 64.4 (*) 78.0 - 100.0 fL   MCH 18.1 (*) 26.0 - 34.0 pg   MCHC 28.1 (*) 30.0 - 36.0  g/dL   RDW 16.1 (*) 09.6 - 04.5 %   Platelets 389  150 - 400 K/uL  CBC     Status: Abnormal   Collection Time    05/01/12 10:22 AM      Result Value Range   WBC 8.4  4.0 - 10.5 K/uL   RBC 3.39 (*) 3.87 - 5.11 MIL/uL   Hemoglobin 6.1 (*) 12.0 - 15.0 g/dL   Comment: REPEATED TO VERIFY     CRITICAL VALUE NOTED.  VALUE IS CONSISTENT WITH PREVIOUSLY REPORTED AND CALLED VALUE.   HCT 21.8 (*) 36.0 - 46.0 %   MCV 64.3 (*) 78.0 - 100.0 fL   MCH 18.0 (*) 26.0 - 34.0 pg   MCHC 28.0 (*) 30.0 - 36.0 g/dL   RDW 40.9 (*) 81.1 - 91.4 %   Platelets 424 (*) 150 - 400 K/uL   Comment: PLATELET COUNT CONFIRMED BY SMEAR  TROPONIN I     Status: None   Collection Time    05/01/12 10:24 AM      Result Value Range   Troponin I <0.30  <0.30 ng/mL   Comment:            Due to the release kinetics of cTnI,     a negative result within the first hours     of the onset of symptoms does not rule out     myocardial infarction with certainty.     If myocardial infarction is still suspected,     repeat the test at appropriate intervals.  CBC     Status: Abnormal   Collection Time    05/01/12  4:20 PM      Result Value Range   WBC 8.7  4.0 - 10.5 K/uL   RBC 3.20 (*) 3.87 - 5.11 MIL/uL   Hemoglobin 5.9 (*) 12.0 - 15.0 g/dL   Comment: CRITICAL VALUE NOTED.  VALUE IS CONSISTENT WITH PREVIOUSLY REPORTED AND CALLED VALUE.   HCT 20.9 (*) 36.0 - 46.0 %   MCV 65.3 (*) 78.0 - 100.0 fL   MCH 18.4 (*) 26.0 - 34.0 pg   MCHC 28.2 (*) 30.0 - 36.0 g/dL   RDW 78.2 (*) 95.6 - 21.3 %   Platelets 403 (*) 150 - 400 K/uL       Patient condition at time of discharge/disposition: stable  Disposition-home   Follow up issues: 1. Continued monitoring of Hgb and treatment of adenomyosis per  [facility], until seen should continue to take provera  Discharge follow up:  Follow-up Information   Follow up with [facility] Schedule an appointment as soon as possible for a visit in 1 week.   Contact information:   [phone]     Discharge Orders   Future Orders Complete By Expires     Call MD for:  difficulty breathing, headache or visual disturbances  As directed     Call MD for:  persistant dizziness or light-headedness  As directed     Call MD for:  As directed     Scheduling Instructions:      Vaginal Bleeding    Diet - low sodium heart healthy  As directed     Increase activity slowly  As directed         Discharge Instructions: Please refer to Patient Instructions section of EMR for full details.  Patient was counseled important signs and symptoms that should prompt return to medical care, changes in medications, dietary instructions, activity restrictions, and follow up  appointments.   Discharge Medications   Medication List    TAKE these medications       cephALEXin 500 MG capsule  Commonly known as:  KEFLEX  Take 500 mg by mouth 3 (three) times daily. 10 day course filled 04/06/2012     ferrous sulfate 325 (65 FE) MG tablet  Take 325 mg by mouth daily with breakfast.     glimepiride 2 MG tablet  Commonly known as:  AMARYL  Take 2 mg by mouth daily before breakfast.     lisinopril-hydrochlorothiazide 20-25 MG per tablet  Commonly known as:  PRINZIDE,ZESTORETIC  Take 1 tablet by mouth daily.     medroxyPROGESTERone 10 MG tablet  Commonly known as:  PROVERA  Take 1 tablet (10 mg total) by mouth daily.     multivitamin with minerals Tabs  Take 1 tablet by mouth daily.     naproxen sodium 220 MG tablet  Commonly known as:  ANAPROX  Take 440 mg by mouth daily as needed. For pain       Marikay Alar, MD of [facility] 05/02/2012 8:35 PM

## 2012-05-03 NOTE — Discharge Summary (Signed)
Seen and examined on the day of DC.  Agree with DC and with Dr. Sonnenberg's management and documentation. 

## 2012-05-05 ENCOUNTER — Encounter: Payer: Self-pay | Admitting: Obstetrics & Gynecology

## 2012-05-05 ENCOUNTER — Other Ambulatory Visit (HOSPITAL_COMMUNITY)
Admission: RE | Admit: 2012-05-05 | Discharge: 2012-05-05 | Disposition: A | Discharge status: Home / Self Care | Payer: Self-pay | Source: Ambulatory Visit | Attending: Obstetrics & Gynecology | Admitting: Obstetrics & Gynecology

## 2012-05-05 ENCOUNTER — Ambulatory Visit (INDEPENDENT_AMBULATORY_CARE_PROVIDER_SITE_OTHER): Payer: Self-pay | Admitting: Obstetrics & Gynecology

## 2012-05-05 VITALS — BP 130/81 | HR 119 | Ht 63.0 in | Wt 197.3 lb

## 2012-05-05 DIAGNOSIS — N938 Other specified abnormal uterine and vaginal bleeding: Secondary | ICD-10-CM | POA: Insufficient documentation

## 2012-05-05 DIAGNOSIS — N949 Unspecified condition associated with female genital organs and menstrual cycle: Secondary | ICD-10-CM | POA: Insufficient documentation

## 2012-05-05 DIAGNOSIS — D649 Anemia, unspecified: Secondary | ICD-10-CM

## 2012-05-05 MED ORDER — INTEGRA F 125-1 MG PO CAPS: 1.0000 | ORAL_CAPSULE | Freq: Every day | ORAL | Status: DC

## 2012-05-05 MED ORDER — MEGESTROL ACETATE 20 MG PO TABS: 40.0000 mg | ORAL_TABLET | Freq: Every day | ORAL | Status: DC

## 2012-05-05 NOTE — Progress Notes (Signed)
Subjective:     Patient ID: Carmen Burns, female   DOB: 1961-03-19, 52 y.o.   MRN: [id number]  HPI  Pt reports a long time h/o vaginal bleeding.  Pt reports that her menses last >8 days and is heavy.  She was recently hospitalized for bleeding and anemia.  She c/o SOB.  Denies chest pain.  She c/o extreme weakness.  She was recently started on Provera with no relief of sx.  Past Medical History  Diagnosis Date  . Hypertension   . Hyperlipidemia   . Diabetes mellitus without complication   . Anemia   History reviewed. No pertinent past surgical history. Current Outpatient Prescriptions on File Prior to Visit  Medication Sig Dispense Refill  . ferrous sulfate 325 (65 FE) MG tablet Take 325 mg by mouth daily with breakfast.      . glimepiride (AMARYL) 2 MG tablet Take 2 mg by mouth daily before breakfast.      . lisinopril-hydrochlorothiazide (PRINZIDE,ZESTORETIC) 20-25 MG per tablet Take 1 tablet by mouth daily.      . Multiple Vitamin (MULTIVITAMIN WITH MINERALS) TABS Take 1 tablet by mouth daily.      . naproxen sodium (ANAPROX) 220 MG tablet Take 440 mg by mouth daily as needed. For pain       No current facility-administered medications on file prior to visit.   Allergies  Allergen Reactions  . Penicillins Rash   Current Outpatient Prescriptions on File Prior to Visit  Medication Sig Dispense Refill  . ferrous sulfate 325 (65 FE) MG tablet Take 325 mg by mouth daily with breakfast.      . glimepiride (AMARYL) 2 MG tablet Take 2 mg by mouth daily before breakfast.      . lisinopril-hydrochlorothiazide (PRINZIDE,ZESTORETIC) 20-25 MG per tablet Take 1 tablet by mouth daily.      . Multiple Vitamin (MULTIVITAMIN WITH MINERALS) TABS Take 1 tablet by mouth daily.      . naproxen sodium (ANAPROX) 220 MG tablet Take 440 mg by mouth daily as needed. For pain       No current facility-administered medications on file prior to visit.   SH: pt is Jehovah's Witness- declines all blood  products   Review of Systems     Objective:   Physical Exam BP 130/81  Pulse 119  Ht 5\' 3"  (1.6 m)  Wt 197 lb 4.8 oz (89.495 kg)  BMI 34.96 kg/m2  LMP 04/20/2012 Pt in NAD.  Ill appearing. Skin pale  Lungs: CTA CV: RRR Abd: obese; NT/ND GU: EGBUS: no lesions Vagina: no blood in vault Cervix: no lesion; no mucopurulent d/c Uterus: enlarged; firm 18 weeks sized Adnexa: no masses  The indications for endometrial biopsy were reviewed using a Spanish interpreter.   Risks of the biopsy including cramping, bleeding, infection, uterine perforation, inadequate specimen and need for additional procedures  were discussed. The patient states she understands and agrees to undergo procedure today. Consent was signed. Time out was performed. Urine HCG was negative. A sterile speculum was placed in the patient's vagina and the cervix was prepped with Betadine. A single-toothed tenaculum was placed on the anterior lip of the cervix to stabilize it. The 3 mm pipelle was introduced into the endometrial cavity without difficulty to a depth of 11 cm, and a moderate amount of tissue was obtained and sent to pathology. The instruments were removed from the patient's vagina. Minimal bleeding from the cervix was noted. The patient tolerated the procedure well.  CBC    Component Value Date/Time   WBC 8.7 05/01/2012 1620   WBC 12.2* 04/30/2012 1328   RBC 3.20* 05/01/2012 1620   RBC 3.98* 04/30/2012 1328   HGB 5.9* 05/01/2012 1620   HGB 6.9* 04/30/2012 1328   HCT 20.9* 05/01/2012 1620   HCT 23.9* 04/30/2012 1328   PLT 403* 05/01/2012 1620   MCV 65.3* 05/01/2012 1620   MCV 60.0* 04/30/2012 1328   MCH 18.4* 05/01/2012 1620   MCH 17.3* 04/30/2012 1328   MCHC 28.2* 05/01/2012 1620   MCHC 28.9* 04/30/2012 1328   RDW 22.2* 05/01/2012 1620   LYMPHSABS 2.9 04/30/2012 1613   MONOABS 0.7 04/30/2012 1613   EOSABS 0.1 04/30/2012 1613   BASOSABS 0.1 04/30/2012 1613     Findings:  Uterus: The uterus is enlarged with a sagittal length of 10.8  cm,  depth of 6.7 cm and width of 9.4 cm. The myometrium is diffusely  heterogeneous and demonstrates posterior acoustical shadowing  throughout. No focal myometrial abnormality is seen. The  endometrial myometrial interface is not well delineated and the  appearance is compatible with diffuse adenomyosis  Endometrium: Cannot be adequately visualized for accurate  measurement.  Right ovary: Is not seen with confidence either transabdominally  or endovaginally  Left ovary: Is not seen with confidence either transabdominally or  endovaginally  Other findings: Unilocular thin-walled simple cyst in the left  adnexa measuring 2.3 x 1.4 x 1.3 cm. A similar finding was seen on  the prior exam this likely represents a benign paraovarian cyst.  No pelvic fluid is noted  IMPRESSION:  Findings compatible with adenomyosis resulting in nonvisualization  of the endometrium with this study.  Non-visualized ovaries.  Thin walled unilocular left adnexal cyst appears stable since the  previous exam. Given the appearance and premenopausal status of  the patient, no further follow-up for this benign finding is  needed.       Assessment:     DUB leading to significant anemia  Anemia Reviewed with pt that her Hct is too low to schedule ANY procedures because she would not be safe for anesthesia induction.  Pt refuses transfusion and says that she understands that her sx are directly related to her anemia. The exam and history was done suing a Spanish interpreter        Plan:     F/u endobx F/u 4 weeks  Routine post-procedure instructions were given to the patient. The patient will follow up to review the results and for further management.   Provera d/c's Megace 40mg  po q day integra 1 po q day

## 2012-05-05 NOTE — Patient Instructions (Addendum)
Menorragia (Menorrhagia) La hemorragia uterina (sangrado del tero) disfuncional es diferente del perodo menstrual normal. Cuando los perodos son irregulares o hay ms hemorragia que lo habitual en usted, se denomina menorragia. La causa puede ser un desequilibrio hormonal, fsico o metablico u otros problemas. Es necesario Medical sales representative examen para que el profesional pueda tratar [employer] causas que son tratables. Si el problema contina, ser necesario realizar una dilatacin y curetaje. Este procedimiento [employer] en dilatar el cuello del tero (la apertura del tero o matriz), es decir, se lo estira para Technical brewer, y se raspa la superficie que tapiza el interior del tero. Un especialista (patlogo) examina en el microscopio el tejido que se retira para asegurarse que no haya nada preocupante que requiera un examen ms exhaustivo. INSTRUCCIONES PARA EL CUIDADO DOMICILIARIO  Si el profesional que la asiste le prescribe medicamentos, tmelos tal como se le indic. No cambie ni reemplace medicamentos sin consultarlo con Mining engineer.  Las hemorragias de larga duracin pueden tener como consecuencia un dficit de hierro. El profesional que la asiste podr prescribirle comprimidos de hierro. Esto ayuda a Scientific laboratory technician que el organismo pierde luego de una hemorragia abundante. Tome los medicamentos tal como se le indic. El hierro puede causarle estreimiento. Si esto es un problema, aumente el consumo de [place], frutas y [place].  No tome aspirina o medicamentos que la contengan desde una semana antes del perodo menstrual ni durante el mismo. La aspirina puede hacer que la hemorragia empeore.  Si necesita cambiar el apsito o el tampn mas de una vez cada 2 horas, permanezca en cama y descanse todo lo posible hasta que la hemorragia se detenga.  Consuma alimentos balanceados. Coma alimentos ricos en hierro. Por ejemplo, verduras de [employer], carne, hgado, huevos y panes y Medical laboratory scientific officer de  grano integral. No trate de perder peso hasta que la hemorragia anormal se detenga y los niveles de hierro en la sangre vuelvan a la normalidad. SOLICITE ATENCIN MDICA SI:  Debe cambiar el apsito o el tampn ms de una vez cada hora.  Si tiene nuseas (ganas de vomitar), vmitos, mareos o diarrea mientras toma los medicamentos.  Tiene algn problema que pueda relacionarse con el medicamento que est tomando. SOLICITE ATENCIN MDICA DE INMEDIATO SI:  Tiene fiebre.  Siente escalofros.  Sufre una hemorragia intensa o comienza a eliminar cogulos de [place].  Se siente mareado o sufre un desmayo. EST SEGURO QUE:   Comprende las instrucciones para el alta mdica.  Controlar su enfermedad.  Solicitar atencin mdica de inmediato segn las indicaciones. Document Released: 12/19/2004 Document Revised: 06/03/2011 [facility] Patient Information 2013 [place], [place]. Anemia, preguntas frecuentes (Anemia, Frequently Asked Questions) CULES SON LOS SNTOMAS DE LA ANEMIA?  Dolor de [place]  Dificultad para pensar.  Fatiga.  Falta de aire.  Debilidad.  Frecuencia cardaca rpida. EN QU PUNTO SE PUEDE CONSIDERAR A UNA PERSONA ANMICA?  Esto vara con el sexo y la edad.   Para definir la anemia se utilizan los valores de hemoglobina (Hgb) y Radiation protection practitioner. Estos valores de laboratorio se obtienen de un recuento completo de sangre (CBC) completo. Se realiza en el consultorio del mdico.  El rango normal de valores de hemoglobina para adultos hombres es de 14.0 g/dL a 11.9 g/dL. Para mujeres no embarazadas, los valores son de 12.3 g/dL a 14.7 g/dL.  La Organizacin Mundial de la Salud define el valor de la anemia en menos de 12 g/dL para mujeres no embarazadas y menos de 13 g/dL para hombres.  Para hombres adultos, el promedio normal de hematocrito es de 46% y el rango vara entre 40% y 52%.  Para mujeres adultas, el promedio normal de hematocrito es de 41% y el rango vara entre  35% y 47%.  Los valores que caen por debajo de los lmites normales pueden ser sntoma de anemia y deben tener un mayor control (evaluacin). GRUPOS DE PERSONAS QUE TIENEN UN MAYOR RIESGO DE DESARROLLAR ANEMIA:   Bebs que se alimentan del pecho de la madre o que ingieren preparado para bebs no fortificado con hierro.  Nio que estn atravesando un perodo de crecimiento rpido. El hierro disponible no puede cumplir con las necesidades de los glbulos rojos que deben crecer junto con el nio.  Mujeres en edad frtil. Necesitan hierro debido a la prdida de Radiation protection practitioner.  Mujeres embarazadas. El feto en crecimiento crea una alta demanda de hierro.  Las personas con hemorragia gastrointestinal continua estn en riesgo de desarrollar una deficiencia de hierro.  Individuos con leucemia o cncer que deben recibir quimioterapia o radiacin para tratar su enfermedad. Las drogas o radiacin utilizadas para tratar estas enfermedades a menudo disminuyen la capacidad de la mdula sea para producir todo tipo de glbulos: sean glbulos rojos, blancos o plaquetas.  Individuos con enfermedades inflamatorias crnicas como artritis reumatoidea o infecciones crnicas.  Los ancianos. EXISTE ALGN TIPO DE ANEMIA QUE SEA HEREDITARIO?   Si, algunos tipos de anemia se deben a defectos heredados o genticos.  Anemia drepanoctica. Esto ocurre con ms frecuencia en personas descendientes de africanos, afro-americanos y Child psychotherapist.  Talasemia (o anemia de [place]). Este tipo de anemia se encuentran en personas descendientes de Child psychotherapist y del sur de [place]. Estos tipos de anemia son muy comunes.  Anemia de [place]. Este trastorno es muy poco comn. ES POSIBLE QUE CIERTOS MEDICAMENTOS PRODUZCAN ANEMIA?  S. Por ejemplo, las drogas para Consulting civil engineer (agentes qumico-teraputicos) a menudo causan anemia. Estas drogas pueden disminuir la capacidad de la mdula sea para producir  glbulos rojos. Si no hay suficientes glbulos rojos, el cuerpo no toma la cantidad [place] de oxgeno. QU NIVEL DE HEMATOCRITO ES SUFICIENTE PARA DONAR SANGRE?  La menor cantidad aceptable de hematocrito para donantes es de 38%. Si tiene Engineer, building services de hematocrito bajo, deber concertar una cita con el mdico. SUELEN REALIZARSE TRANSFUSIONES PARA TRATAR LA ANEMIA? SON PELIGROSAS?  Se utilizan como ltimo recurso para tratar la anemia. El Pensions consultant la causa de la anemia y de ser posible la corregir. La mayora de las transfusiones se realizan debido a una hemorragia excesiva durante una ciruga, por traumatismos, o debido a una supresin de la mdula sea en pacientes con cncer o leucemia durante la quimioterapia. Las transfusiones son mucho ms seguras que antes. Tambin es sabido que las transfusiones afectan al sistema inmune y pueden aumentar ciertos riesgos. Existe una posibilidad de error humano, en la que 1 de cada 16.000 transfusiones puede resultar en que el paciente reciba una transfusin que no concuerda con su tipo de [place].  QU ES LA DEFICIENCIA DE HIERRO? PUEDO CORREGIRLA CON UN CAMBIO EN MI DIETA?  El hierro es una parte esencial de la hemoglobina. Sin hemoglobina suficiente, se desarrolla anemia y el cuerpo no toma la cantidad suficiente de oxgeno. La anemia por deficiencia de hierro se desarrolla una vez que el cuerpo ha tenido un bajo nivel de hierro durante [place]. Esto puede ocasionarse tanto por prdida de [place], no tomar o absorber hierro suficiente, o un aumento en  la [employer] de hierro (como durante el embarazo o el rpido crecimiento).  Los alimentos de origen animal como carne de vaca, pollo o cerdo, son buenas fuentes de hierro. Asegrese de consumir uno de estos alimentos con cada comida. La vitamina C le ayuda a que el cuerpo absorba hierro. Los alimentos ricos en vitamina C incluyen ctricos, pimientos, fresas, espinacas y cantalupos. En algunos casos puede  ser necesario un suplemento de hierro para asegurarse de corregir la deficiencia de hierro. En el caso de una mala absorcin, el hierro extra deber administrarse directamente en la vena con una inyeccin (va intravenosa). ME HAN DIAGNOSTICADO ANEMIA POR DEFICIENCIA DE HIERRO Y EL MDICO ME HA RECETADO SUPLEMENTOS DE HIERRO. CUNTO TIEMPO DEBO TOMARLO PARA LLEGAR A LOS NIVELES NORMALES?  Depende del grado de anemia al comienzo del tratamiento. La mayora de las personas con deficiencia de hierro leve a moderada, Copy los niveles normales despus de 2 o 3 meses. Sin embargo una vez corregida la anemia, el hierro almacenado en el cuerpo sigue siendo bajo. Los mdicos suelen sugerir una terapia adicional de hierro por 6 meses una vez que se ha corregido la anemia. Esto ayudar a prevenir que la anemia por deficiencia de hierro reaparezca rpidamente.  MI NIVEL DE HEMOGLOBINA ES DE 9 G/DL Y DEBO REALIZARME UNA CIRUGA. DEBERA POSPONERLA?  Si tiene un nivel de hemoglobina de 9, deber comentarlo de inmediato con el profesional que lo asiste. Muchos pacientes con niveles de hemoglobina similares han sido intervenidos quirrgicamente sin problemas. Si se espera que haya una mnima prdida de sangre de un procedimiento menor, no ser necesario tratamiento alguno.  Si se espera una prdida de [place] mayor para procedimientos ms extensivos, deber preguntar al mdico acerca de realizar un tratamiento con eritropoyetina y hierro para acelerar la recuperacin de la hemoglobina a niveles normales antes de la ciruga. Un paciente anmico que debe realizarse una ciruga que conlleva una gran prdida de sangre tiene un mayor riesgo de sufrir complicaciones y Pension scheme manager una transfusin, que tambin conlleva algn riesgo.  HE ODO QUE LOS PERODOS MENSTRUALES FUERTES CAUSAN ANEMIA. HAY ALGO QUE PUEDA HACER PARA PREVERNIRLA?  La anemia que resulta de perodos con prdidas menstruales abundantes suele deberse a una  deficiencia de hierro. Puede intentar cubrir la demanda creciente de hierro que se ocasiona por las prdidas menstruales fuertes mediante el aumento del consumo de alimentos ricos en hierro. Podrn ser necesarios suplementos de hierro. Consulte con el mdico si hay algo que le preocupa. QU OCASIONA LA ANEMIA DURANTE EL EMBARAZO?  El embarazo realiza grandes demandas al cuerpo. La madre debe cumplir con las necesidades tanto de su cuerpo como del beb por nacer. El cuerpo necesita hierro y folato suficientes para realizar una adecuada cantidad de glbulos rojos. Para prevenir la anemia durante el embarazo, la Museum/gallery conservator en contacto constante con el mdico.  Asegrese de consumir una dieta rica en hierro y folato como hgado y vegetales de Lawyer. El folato cumple un papel muy importante en el desarrollo normal de la mdula espinal del beb El folato puede ayudar a prevenir afecciones graves como la espina bfida. Si la dieta que consume no le proporciona los nutrientes adecuados, Programmer, multimedia con el mdico acerca del consumo de suplementos nutricionales.  CUL ES LA RELACIN ENTRE LOS TUMORES FIBROIDES Y LA ANEMIA EN LAS MUJERES?  La relacin suele estar ocasionada por un aumento en la prdida de flujo menstrual ocasionado por los fibroides. Se requerir una buena ingesta de  hierro para prevenir su deficiencia y Automotive engineer que se desarrolle la anemia.  Document Released: 06/07/2008 Document Revised: 06/03/2011 [facility] Patient Information 2013 [place], [place].

## 2012-05-12 ENCOUNTER — Telehealth: Payer: Self-pay | Admitting: *Deleted

## 2012-05-12 ENCOUNTER — Encounter: Payer: Self-pay | Admitting: *Deleted

## 2012-05-12 NOTE — Telephone Encounter (Signed)
Message copied by RASH, AMANDA A on Tue May 12, 2012  2:13 PM ------      Message from: HARRAWAY-SMITH, CAROLYN      Created: Fri May 08, 2012  2:41 PM       Please call pt.  Notify of normal endo bx results.  Please encourage her to continue her medication and f/u in 4 weeks or sooner prn.            Need Spanish interpreter.            clh-S ------ 

## 2012-05-12 NOTE — Telephone Encounter (Signed)
Patient informed of her results

## 2012-05-12 NOTE — Telephone Encounter (Deleted)
Message copied by Mannie Stabile on Tue May 12, 2012  2:13 PM ------      Message from: Willodean Rosenthal      Created: Fri May 08, 2012  2:41 PM       Please call pt.  Notify of normal endo bx results.  Please encourage her to continue her medication and f/u in 4 weeks or sooner prn.            Need Spanish interpreter.            clh-S ------

## 2012-05-20 ENCOUNTER — Encounter: Payer: Self-pay | Admitting: Obstetrics and Gynecology

## 2012-06-01 ENCOUNTER — Ambulatory Visit (INDEPENDENT_AMBULATORY_CARE_PROVIDER_SITE_OTHER): Payer: Self-pay | Admitting: Obstetrics & Gynecology

## 2012-06-01 ENCOUNTER — Encounter: Payer: Self-pay | Admitting: Obstetrics & Gynecology

## 2012-06-01 VITALS — BP 123/72 | HR 86 | Temp 96.9°F | Ht 63.0 in | Wt 199.7 lb

## 2012-06-01 DIAGNOSIS — D649 Anemia, unspecified: Secondary | ICD-10-CM

## 2012-06-01 DIAGNOSIS — N949 Unspecified condition associated with female genital organs and menstrual cycle: Secondary | ICD-10-CM

## 2012-06-01 DIAGNOSIS — N938 Other specified abnormal uterine and vaginal bleeding: Secondary | ICD-10-CM

## 2012-06-01 DIAGNOSIS — D500 Iron deficiency anemia secondary to blood loss (chronic): Secondary | ICD-10-CM

## 2012-06-01 MED ORDER — INTEGRA F 125-1 MG PO CAPS: 1.0000 | ORAL_CAPSULE | Freq: Every day | ORAL | Status: DC

## 2012-06-01 MED ORDER — MEGESTROL ACETATE 20 MG PO TABS: 40.0000 mg | ORAL_TABLET | Freq: Every day | ORAL | Status: DC

## 2012-06-01 NOTE — Progress Notes (Signed)
Subjective:     Patient ID: Carmen Burns, female   DOB: 08-19-1960, 52 y.o.   MRN: [id number]  HPI Pt seen 1 month prev for severe symptomatic anemia due to DUB.  Pt refused blood transfusion due to religious reasons.  She reports that she has been taking her meds and has had no further bleeding since starting the Megace.  She says that her SOB is improved.  She is worried that she is on too high a does of BP meds.  Past Medical History  Diagnosis Date  . Hypertension   . Hyperlipidemia   . Diabetes mellitus without complication   . Anemia     Review of Systems     Objective:   Physical Exam BP 123/72  Pulse 86  Temp(Src) 96.9 F (36.1 C) (Oral)  Ht 5\' 3"  (1.6 m)  Wt 199 lb 11.2 oz (90.583 kg)  BMI 35.38 kg/m2  LMP 04/20/2012 Pt in NAD.  Skin color much improved.   Ext: nail beds pink with improved cap refill  05/05/2012 Diagnosis Endometrium, biopsy - BENIGN SECRETORY ENDOMETRIUM, NO ATYPIA, HYPERPLASIA OR MALIGNANCY  CBC    Component Value Date/Time   WBC 8.7 05/01/2012 1620   WBC 12.2* 04/30/2012 1328   RBC 3.20* 05/01/2012 1620   RBC 3.98* 04/30/2012 1328   HGB 5.9* 05/01/2012 1620   HGB 6.9* 04/30/2012 1328   HCT 20.9* 05/01/2012 1620   HCT 23.9* 04/30/2012 1328   PLT 403* 05/01/2012 1620   MCV 65.3* 05/01/2012 1620   MCV 60.0* 04/30/2012 1328   MCH 18.4* 05/01/2012 1620   MCH 17.3* 04/30/2012 1328   MCHC 28.2* 05/01/2012 1620   MCHC 28.9* 04/30/2012 1328   RDW 22.2* 05/01/2012 1620   LYMPHSABS 2.9 04/30/2012 1613   MONOABS 0.7 04/30/2012 1613   EOSABS 0.1 04/30/2012 1613   BASOSABS 0.1 04/30/2012 1613        Assessment:     DUB improved on Megace.  Reviewed Endo Bx Symptomatic anemia- appears clinically improved after 1 month of treatment HTN- would not adjust meds at this point  Spanish interpreter used for entire conversation       Plan:     Keep megace 40mg  q day Intergren sx improved will order CBC  f/u in 3 months

## 2012-06-01 NOTE — Patient Instructions (Signed)
Hemorragia uterina disfuncional (Dysfunctional Uterine Bleeding) Normalmente, los perodos menstruales comienzan en las jvenes de entre 11 y 17 aos. Un ciclo o perodo menstrual puede repetirse entre los 23 das y los 35 das y dura entre 1 y 7 das. Entre los 12 y los 14 das antes de que comience el ciclo menstrual, se produce la ovulacin (los ovarios producen vulos). Cuando cuente los periodos, hgalo desde el primer da del comienzo de la hemorragia del perodo anterior hasta el primer da de la hemorragia del perodo siguiente. La hemorragia uterina disfuncional (anormal) es diferente del perodo menstrual normal. Los perodos pueden comenzar antes o despus que lo habitual. Pueden ser menos abundantes, presentar cogulos, o ser ms abundantes. Puede tener hemorragias entre los perodos o saltear un perodo o ms. Puede tener hemorragia luego de mantener relaciones sexuales, despus de la menopausia o faltarle el perodo menstrual. CAUSAS  Embarazo (normal, aborto espontneo, embarazo ectpico).  DIU (dispositivo intrauterino, anticonceptivo)  Pldoras anticonceptivas  Tratamiento hormonal  La menopausia  Infeccin en el cervix.  Problemas de coagulacin.  Infecciones de la superficie interna del tero.  Endometriosis: la superficie interna del tero se desarrolla en la pelvis y otros rganos femeninos.  Adherencias (tejido cicatrizal) dentro del tero.  Obesidad o severa prdida de peso.  Plipos en el tero.  Cncer en el crvix, la vagina o el tero.  Quiste de ovarios o Sndrome ovrico poliqustico.  Otras enfermedades (diabetes, enfermedad de la tiroides, etc).  Fibromas en el tero (tumores no cancerosos).  Problemas con las hormonas femeninas.  Hiperplasia del endometrio: capa gruesa y clulas agrandadas dentro del tero.  Medicamentos que interfieren con la ovulacin.  Radiacin en la pelvis o el abdomen.  Quimioterapia. DIAGNSTICO  El mdico  analizar la historia de sus perodos menstruales, los medicamentos que toma, los cambios en el peso corporal, el estrs de la vida diaria y cualquier problema mdico que tenga.  El mdico realizar un examen fsico, incluyendo un examen plvico.  Tambin le solicitar anlisis para completar el diagnstico. Ellos son:  Papanicolau.  Anlisis de sangre.  Cultivos para descartar infecciones.  Tomografa computada.  Ultrasonido.  Histeroscopa.  Laparoscopa.  Resonancia magntica.  Histerosalpingografa.  Dilatacin y curetaje.  Biopsia de endometrio. TRATAMIENTO El tratamiento depender de la causa de la hemorragia.. El tratamiento consiste en:  Observacin de los perodos menstruales durante algunos meses.  Prescripcin de medicamentos como:  Antibiticos:  Hormonas.  Pldoras anticonceptivas  Retirar el DIU (dispositivo intrauterino, anticonceptivo).  Ciruga:  Dilatacin y curetaje (raspado y remocin de tejido de la zona interna del tero).  Laparoscopa (examen del interior del abdomen con un tubo con luz).  Ablacin uterina (destruccin de la membrana que cubre el tero con corriente elctrica, rayos lser, congelamiento o calor ).  Histeroscopa (examen del cuello y el tero con un tubo con luz).  Histerectoma (extirpacin del tero). INSTRUCCIONES PARA EL CUIDADO DOMICILIARIO  Si el profesional que la asiste le prescribe medicamentos, tmelos tal como se le indic. No cambie ni reemplace medicamentos sin consultarlo con el profesional.  Las hemorragias de larga duracin pueden traen como consecuencia un dficit de hierro. El profesional que lo asiste podr prescribirle hierro. Esto ayuda a reponer el hierro que el organismo pierde luego de una hemorragia abundante. Tome los medicamentos tal como se le indic.  No tome aspirina o medicamentos que la contengan u desde una semana antes del perodo menstrual ni durante el mismo. La aspirina puede hacer  que la hemorragia empeore.    Si necesita cambiar el apsito o el tampn mas de una vez cada 2 horas, permanezca en cama con los pies elevados y aplique una compresa fra en la zona baja del abdomen. Descanse todo lo que pueda hasta que la hemorragia se detenga.  Consuma alimentos balanceados. Coma alimentos ricos en hierro. Por ejemplo:  Vegetales verdes frescos.  Cereales integrales y cereales y panes con salvado.  Huevos.  Carnes.  Hgado.  No trate de perder peso hasta que la hemorragia anormal se detenga y los niveles de hierro en la sangre vuelvan a la normalidad. No levante pesos de ms de 10 libras (5 kg.) ni realice actividades extenuantes mientras tenga la hemorragia.  Durante un par de meses tome nota en un almanaque y [employer] comienzo y el fin de su perodo menstrual y el tipo de sangrado (escaso, [place], [place], gotas, cogulos o falta de perodo). El objetivo es que su mdico evale mejor el problema. SOLICITE ATENCIN MDICA SI:  Debe cambiar el apsito o el tampn ms de una vez cada hora.  Se siente mareada o dbil.  Tiene algn problema que pueda relacionarse con el medicamento que est tomando.  Siente dolor.  Desea retirar su DIU.  Quiere suspender o cambiar sus pldoras anticonceptivas u hormonas.  Tiene algn tipo de hemorragia anormal mencionada anteriormente.  Tiene ms de 89 aos y an no ha tenido su perodo menstrual.  Tiene 66 aos y an tiene Environmental manager.  Tiene alguno de los sntomas mencionados anteriormente.  Aparece una erupcin cutnea. SOLICITE ATENCIN MDICA DE INMEDIATO SI:  La temperatura oral se eleva sin motivo por encima de 102 F (38.9 C).  Comienza a sentir escalofros.  Debe cambiar el apsito o el tampn ms de una vez cada hora.  Siente dolor abdominal.  Pierde el conocimiento, se desmaya. Document Released: 12/19/2004 Document Revised: 06/03/2011 [employer] [facility] Patient Information 2013 [place],  [place]. Anemia, preguntas frecuentes (Anemia, Frequently Asked Questions) CULES SON LOS SNTOMAS DE LA ANEMIA?  Dolor de [place]  Dificultad para pensar.  Fatiga.  Falta de aire.  Debilidad.  Frecuencia cardaca rpida. EN QU PUNTO SE PUEDE CONSIDERAR A UNA PERSONA ANMICA?  Esto vara con el sexo y la edad.   Para definir la anemia se utilizan los valores de hemoglobina (Hgb) y Radiation protection practitioner. Estos valores de laboratorio se obtienen de un recuento completo de sangre (CBC) completo. Se realiza en el consultorio del mdico.  El rango normal de valores de hemoglobina para adultos hombres es de 14.0 g/dL a 40.9 g/dL. Para mujeres no embarazadas, los valores son de 12.3 g/dL a 81.1 g/dL.  La Organizacin Mundial de la Salud define el valor de la anemia en menos de 12 g/dL para mujeres no embarazadas y menos de 13 g/dL para hombres.  Para hombres adultos, el promedio normal de hematocrito es de 46% y el rango vara entre 40% y 52%.  Para mujeres adultas, el promedio normal de hematocrito es de 41% y el rango vara entre 35% y 47%.  Los valores que caen por debajo de los lmites normales pueden ser sntoma de anemia y deben tener un mayor control (evaluacin). GRUPOS DE PERSONAS QUE TIENEN UN MAYOR RIESGO DE DESARROLLAR ANEMIA:   Bebs que se alimentan del pecho de la madre o que ingieren preparado para bebs no fortificado con hierro.  Nio que estn atravesando un perodo de crecimiento rpido. El hierro disponible no puede cumplir con las necesidades de los glbulos rojos que deben crecer junto con el nio.  Mujeres en edad frtil. Necesitan hierro debido a la prdida de Radiation protection practitioner.  Mujeres embarazadas. El feto en crecimiento crea una alta demanda de hierro.  Las personas con hemorragia gastrointestinal continua estn en riesgo de desarrollar una deficiencia de hierro.  Individuos con leucemia o cncer que deben recibir quimioterapia o radiacin para tratar  su enfermedad. Las drogas o radiacin utilizadas para tratar estas enfermedades a menudo disminuyen la capacidad de la mdula sea para producir todo tipo de glbulos: sean glbulos rojos, blancos o plaquetas.  Individuos con enfermedades inflamatorias crnicas como artritis reumatoidea o infecciones crnicas.  Los ancianos. EXISTE ALGN TIPO DE ANEMIA QUE SEA HEREDITARIO?   Si, algunos tipos de anemia se deben a defectos heredados o genticos.  Anemia drepanoctica. Esto ocurre con ms frecuencia en personas descendientes de africanos, afro-americanos y Child psychotherapist.  Talasemia (o anemia de [place]). Este tipo de anemia se encuentran en personas descendientes de Child psychotherapist y del sur de [place]. Estos tipos de anemia son muy comunes.  Anemia de [place]. Este trastorno es muy poco comn. ES POSIBLE QUE CIERTOS MEDICAMENTOS PRODUZCAN ANEMIA?  S. Por ejemplo, las drogas para Consulting civil engineer (agentes qumico-teraputicos) a menudo causan anemia. Estas drogas pueden disminuir la capacidad de la mdula sea para producir glbulos rojos. Si no hay suficientes glbulos rojos, el cuerpo no toma la cantidad [place] de oxgeno. QU NIVEL DE HEMATOCRITO ES SUFICIENTE PARA DONAR SANGRE?  La menor cantidad aceptable de hematocrito para donantes es de 38%. Si tiene Engineer, building services de hematocrito bajo, deber concertar una cita con el mdico. SUELEN REALIZARSE TRANSFUSIONES PARA TRATAR LA ANEMIA? SON PELIGROSAS?  Se utilizan como ltimo recurso para tratar la anemia. El Pensions consultant la causa de la anemia y de ser posible la corregir. La mayora de las transfusiones se realizan debido a una hemorragia excesiva durante una ciruga, por traumatismos, o debido a una supresin de la mdula sea en pacientes con cncer o leucemia durante la quimioterapia. Las transfusiones son mucho ms seguras que antes. Tambin es sabido que las transfusiones afectan al sistema inmune y pueden aumentar ciertos riesgos.  Existe una posibilidad de error humano, en la que 1 de cada 16.000 transfusiones puede resultar en que el paciente reciba una transfusin que no concuerda con su tipo de [place].  QU ES LA DEFICIENCIA DE HIERRO? PUEDO CORREGIRLA CON UN CAMBIO EN MI DIETA?  El hierro es una parte esencial de la hemoglobina. Sin hemoglobina suficiente, se desarrolla anemia y el cuerpo no toma la cantidad suficiente de oxgeno. La anemia por deficiencia de hierro se desarrolla una vez que el cuerpo ha tenido un bajo nivel de hierro durante [place]. Esto puede ocasionarse tanto por prdida de [place], no tomar o Barrister's clerk, o un aumento en la demanda de hierro (como durante el embarazo o el rpido Designer, jewellery).  Los alimentos de origen animal como carne de vaca, pollo o cerdo, son buenas fuentes de hierro. Asegrese de consumir uno de estos alimentos con cada comida. La vitamina C le ayuda a que el cuerpo absorba hierro. Los alimentos ricos en vitamina C incluyen ctricos, pimientos, fresas, espinacas y cantalupos. En algunos casos puede ser necesario un suplemento de hierro para asegurarse de corregir la deficiencia de hierro. En el caso de una mala absorcin, el hierro extra deber administrarse directamente en la vena con una inyeccin (va intravenosa). ME HAN DIAGNOSTICADO ANEMIA POR DEFICIENCIA DE HIERRO Y EL MDICO ME HA RECETADO SUPLEMENTOS DE HIERRO. CUNTO TIEMPO DEBO TOMARLO PARA  LLEGAR A LOS NIVELES NORMALES?  Depende del grado de anemia al comienzo del tratamiento. La mayora de las personas con deficiencia de hierro leve a moderada, Copy los niveles normales despus de 2 o 3 meses. Sin embargo una vez corregida la anemia, el hierro almacenado en el cuerpo sigue siendo bajo. Los mdicos suelen sugerir una terapia adicional de hierro por 6 meses una vez que se ha corregido la anemia. Esto ayudar a prevenir que la anemia por deficiencia de hierro reaparezca rpidamente.  MI NIVEL DE  HEMOGLOBINA ES DE 9 G/DL Y DEBO REALIZARME UNA CIRUGA. DEBERA POSPONERLA?  Si tiene un nivel de hemoglobina de 9, deber comentarlo de inmediato con el profesional que lo asiste. Muchos pacientes con niveles de hemoglobina similares han sido intervenidos quirrgicamente sin problemas. Si se espera que haya una mnima prdida de sangre de un procedimiento menor, no ser necesario tratamiento alguno.  Si se espera una prdida de [place] mayor para procedimientos ms extensivos, deber preguntar al mdico acerca de realizar un tratamiento con eritropoyetina y hierro para acelerar la recuperacin de la hemoglobina a niveles normales antes de la ciruga. Un paciente anmico que debe realizarse una ciruga que conlleva una gran prdida de sangre tiene un mayor riesgo de sufrir complicaciones y Pension scheme manager una transfusin, que tambin conlleva algn riesgo.  HE ODO QUE LOS PERODOS MENSTRUALES FUERTES CAUSAN ANEMIA. HAY ALGO QUE PUEDA HACER PARA PREVERNIRLA?  La anemia que resulta de perodos con prdidas menstruales abundantes suele deberse a una deficiencia de hierro. Puede intentar cubrir la demanda creciente de hierro que se ocasiona por las prdidas menstruales fuertes mediante el aumento del consumo de alimentos ricos en hierro. Podrn ser necesarios suplementos de hierro. Consulte con el mdico si hay algo que le preocupa. QU OCASIONA LA ANEMIA DURANTE EL EMBARAZO?  El embarazo realiza grandes demandas al cuerpo. La madre debe cumplir con las necesidades tanto de su cuerpo como del beb por nacer. El cuerpo necesita hierro y folato suficientes para realizar una adecuada cantidad de glbulos rojos. Para prevenir la anemia durante el embarazo, la Museum/gallery conservator en contacto constante con el mdico.  Asegrese de consumir una dieta rica en hierro y folato como hgado y vegetales de Lawyer. El folato cumple un papel muy importante en el desarrollo normal de la mdula espinal del beb El folato  puede ayudar a prevenir afecciones graves como la espina bfida. Si la dieta que consume no le proporciona los nutrientes adecuados, Programmer, multimedia con el mdico acerca del consumo de suplementos nutricionales.  CUL ES LA RELACIN ENTRE LOS TUMORES FIBROIDES Y LA ANEMIA EN LAS MUJERES?  La relacin suele estar ocasionada por un aumento en la prdida de flujo menstrual ocasionado por los fibroides. Se requerir una buena ingesta de hierro para prevenir su deficiencia y Automotive engineer que se desarrolle la anemia.  Document Released: 06/07/2008 Document Revised: 06/03/2011 [facility] Patient Information 2013 [place], [place].

## 2012-06-02 LAB — CBC
MCH: 25.1 pg — ABNORMAL LOW (ref 26.0–34.0)
MCV: 81 fL (ref 78.0–100.0)
RBC: 4.74 MIL/uL (ref 3.87–5.11)

## 2012-08-24 ENCOUNTER — Encounter: Payer: Self-pay | Admitting: Obstetrics & Gynecology

## 2012-08-24 ENCOUNTER — Ambulatory Visit (INDEPENDENT_AMBULATORY_CARE_PROVIDER_SITE_OTHER): Payer: Self-pay | Admitting: Obstetrics & Gynecology

## 2012-08-24 VITALS — BP 134/79 | HR 108 | Wt 213.9 lb

## 2012-08-24 DIAGNOSIS — N938 Other specified abnormal uterine and vaginal bleeding: Secondary | ICD-10-CM

## 2012-08-24 DIAGNOSIS — D649 Anemia, unspecified: Secondary | ICD-10-CM

## 2012-08-24 DIAGNOSIS — N949 Unspecified condition associated with female genital organs and menstrual cycle: Secondary | ICD-10-CM

## 2012-08-24 LAB — CBC
MCH: 29.8 pg (ref 26.0–34.0)
MCHC: 34.2 g/dL (ref 30.0–36.0)
MCV: 87.1 fL (ref 78.0–100.0)
Platelets: 289 10*3/uL (ref 150–400)
RDW: 14.1 % (ref 11.5–15.5)
WBC: 9.5 10*3/uL (ref 4.0–10.5)

## 2012-08-24 MED ORDER — MEGESTROL ACETATE 20 MG PO TABS: 40.0000 mg | ORAL_TABLET | Freq: Two times a day (BID) | ORAL | Status: DC

## 2012-08-24 MED ORDER — NORGESTIMATE-ETH ESTRADIOL 0.25-35 MG-MCG PO TABS: 1.0000 | ORAL_TABLET | Freq: Every day | ORAL | Status: DC

## 2012-08-24 NOTE — Progress Notes (Signed)
Subjective:     Patient ID: Carmen Burns, female   DOB: 1960-12-30, 52 y.o.   MRN: [id number]  HPI Pt with h/o perimenopausal bleeding with significant anemia.  Pt stopped bleeding at her next visit but, reported that she started bleeding soon after her visit.  She reports that she bled another 2 months and stopped on ly 1 week previously.   No pain associated with her bleeding.   Pt denies dizziness or weakness.  Review of Systems     Objective:   Physical Exam BP 134/79  Pulse 108  Wt 213 lb 14.4 oz (97.024 kg)  BMI 37.9 kg/m2 Pt in NAD Exam deferred 05/01/2012 Comparison: 07/09/2010  Findings:  Uterus: The uterus is enlarged with a sagittal length of 10.8 cm,  depth of 6.7 cm and width of 9.4 cm. The myometrium is diffusely  heterogeneous and demonstrates posterior acoustical shadowing  throughout. No focal myometrial abnormality is seen. The  endometrial myometrial interface is not well delineated and the  appearance is compatible with diffuse adenomyosis  Endometrium: Cannot be adequately visualized for accurate  measurement.  Right ovary: Is not seen with confidence either transabdominally  or endovaginally  Left ovary: Is not seen with confidence either transabdominally or  endovaginally  Other findings: Unilocular thin-walled simple cyst in the left  adnexa measuring 2.3 x 1.4 x 1.3 cm. A similar finding was seen on  the prior exam this likely represents a benign paraovarian cyst.  No pelvic fluid is noted  IMPRESSION:  Findings compatible with adenomyosis resulting in nonvisualization  of the endometrium with this study.  Non-visualized ovaries.  Thin walled unilocular left adnexal cyst appears stable since the  previous exam. Given the appearance and premenopausal status of  the patient, no further follow-up for this benign finding is  needed.   05/05/2012 Endobx: Diagnosis Endometrium, biopsy - BENIGN SECRETORY ENDOMETRIUM, NO ATYPIA, HYPERPLASIA OR  MALIGNANCY    Assessment:     Menometrorrhagia- previously improved with Megace now with continued irreg menses.  D/W patient attempting to use conservative methods until menopause but, reviewed with patient her surgical options including endometrial ablation and hysterectomy.  She wishes to continue with conservative measures at present.      Plan:     D/c Megace Sprintec 1 po q day F/u 2-3 months or sooner prn CBC today Pt to complete financial aid paperwork Encouraged pt to call PRIOR to the next f/u if her bleeding returned

## 2012-08-24 NOTE — Patient Instructions (Signed)
Endometrial Ablation Endometrial ablation removes the lining of the uterus (endometrium). It is usually a same day, outpatient treatment. Ablation helps avoid major surgery (such as a hysterectomy). A hysterectomy is removal of the cervix and uterus. Endometrial ablation has less risk and complications, has a shorter recovery period and is less expensive. After endometrial ablation, most women will have little or no menstrual bleeding. You may not keep your fertility. Pregnancy is no longer likely after this procedure but if you are pre-menopausal, you still need to use a reliable method of birth control following the procedure because pregnancy can occur. REASONS TO HAVE THE PROCEDURE MAY INCLUDE:  Heavy periods.  Bleeding that is causing anemia.  Anovulatory bleeding, very irregular, bleeding.  Bleeding submucous fibroids (on the lining inside the uterus) if they are smaller than 3 centimeters. REASONS NOT TO HAVE THE PROCEDURE MAY INCLUDE:  You wish to have more children.  You have a pre-cancerous or cancerous problem. The cause of any abnormal bleeding must be diagnosed before having the procedure.  You have pain coming from the uterus.  You have a submucus fibroid larger than 3 centimeters.  You recently had a baby.  You recently had an infection in the uterus.  You have a severe retro-flexed, tipped uterus and cannot insert the instrument to do the ablation.  You had a Cesarean section or deep major surgery on the uterus.  The inner cavity of the uterus is too large for the endometrial ablation instrument. RISKS AND COMPLICATIONS   Perforation of the uterus.  Bleeding.  Infection of the uterus, bladder or vagina.  Injury to surrounding organs.  Cutting the cervix.  An air bubble to the lung (air embolus).  Pregnancy following the procedure.  Failure of the procedure to help the problem requiring hysterectomy.  Decreased ability to diagnose cancer in the lining of  the uterus. BEFORE THE PROCEDURE  The lining of the uterus must be tested to make sure there is no pre-cancerous or cancer cells present.  Medications may be given to make the lining of the uterus thinner.  Ultrasound may be used to evaluate the size and look for abnormalities of the uterus.  Future pregnancy is not desired. PROCEDURE  There are different ways to destroy the lining of the uterus.   Resectoscope - radio frequency-alternating electric current is the most common one used.  Cryotherapy - freezing the lining of the uterus.  Heated Free Liquid - heated salt (saline) solution inserted into the uterus.  Microwave - uses high energy microwaves in the uterus.  Thermal Balloon - a catheter with a balloon tip is inserted into the uterus and filled with heated fluid. Your caregiver will talk with you about the method used in this clinic. They will also instruct you on the pros and cons of the procedure. Endometrial ablation is performed along with a procedure called operative hysteroscopy. A narrow viewing tube is inserted through the birth canal (vagina) and through the cervix into the uterus. A tiny camera attached to the viewing tube (hysteroscope) allows the uterine cavity to be shown on a TV monitor during surgery. Your uterus is filled with a harmless liquid to make the procedure easier. The lining of the uterus is then removed. The lining can also be removed with a resectoscope which allows your surgeon to cut away the lining of the uterus under direct vision. Usually, you will be able to go home within an hour after the procedure. HOME CARE INSTRUCTIONS   Do   not drive for 24 hours.  No tampons, douching or intercourse for 2 weeks or until your caregiver approves.  Rest at home for 24 to 48 hours. You may then resume normal activities unless told differently by your caregiver.  Take your temperature two times a day for 4 days, and record it.  Take any medications your  caregiver has ordered, as directed.  Use some form of contraception if you are pre-menopausal and do not want to get pregnant. Bleeding after the procedure is normal. It varies from light spotting and mildly watery to bloody discharge for 4 to 6 weeks. You may also have mild cramping. Only take over-the-counter or prescription medicines for pain, discomfort, or fever as directed by your caregiver. Do not use aspirin, as this may aggravate bleeding. Frequent urination during the first 24 hours is normal. You will not know how effective your surgery is until at least 3 months after the surgery. SEEK IMMEDIATE MEDICAL CARE IF:   Bleeding is heavier than a normal menstrual cycle.  An oral temperature above 102 F (38.9 C) develops.  You have increasing cramps or pains not relieved with medication or develop belly (abdominal) pain which does not seem to be related to the same area of earlier cramping and pain.  You are light headed, weak or have fainting episodes.  You develop pain in the shoulder strap areas.  You have chest or leg pain.  You have abnormal vaginal discharge.  You have painful urination. Document Released: 01/19/2004 Document Revised: 06/03/2011 Document Reviewed: 04/18/2007 [facility] Patient Information 2014 [place], [place]. Informacin sobre la histerectoma  (Hysterectomy Information)  Neomia Dear histerectoma es un procedimiento en el que se extirpa quirrgicamente el tero. Ya no tendr perodos menstruales ni quedar embarazada. Tambin durante esta ciruga podrn extirparle las trompas y los ovarios (salpingo-ooforectoma bilateral).  RAZONES PARA REALIZAR UNA HISTERECTOMA   Sangrado persistente, anormal.  Infeccin o dolor plvico de larga duracin (crnico).  El revestimiento del tero (endometrio) comienza a desarrollarse fuera del tero (endometriosis).  El endometrio comienza a desarrollarse en el msculo del tero (adenomiosis).  El tero desciende hacia la  vagina (prolapso de los rganos plvicos).  Fibromas uterinos sintomticos.  Clulas precancerosas.  Cncer cervical o cncer uterino. TIPOS DE HISTERECTOMA   Histerectoma supracervical. Este tipo de ciruga elimina la parte superior del tero, pero no el cuello del tero.  Histerectoma total. Se extirpa el tero y el cuello uterino.  Histerectoma radical. Se extrae el tero, el cuello uterino y el tejido fibroso que sostiene el tero en su lugar en la pelvis (parametrio). FORMAS EN QUE SE PUEDE REALIZAR UNA HISTERECTOMA   Histerectoma abdominal. Se realiza gran corte quirrgico (incisin) en el abdomen. Se extirpa el tero a travs de esta incisin.  Histerectoma vaginal. Se hace una incisin en la vagina. Se extirpa el tero a travs de esta incisin. No hay incisiones abdominales.  Histerectoma laparoscpica convencional. Se inserta un tubo delgado que emite luz y que posee una cmara (laparoscopio) en 3 o 4 incisiones pequeas en el abdomen. El tero se corta en trozos pequeos. Las piezas pequeas se eliminan a travs de las incisiones, o se retiran a travs de la vagina.  Histerectoma laparoscpica vaginal asistida. Se hacen tres o cuatro incisiones pequeas en el abdomen. Parte de la ciruga se realiza por va laparoscpica y parte por va vaginal. El tero se extirpa a travs de la vagina.  Histerectoma laparoscpica asistida por robot. Se inserta un laparoscopio en 3 o 4 incisiones  pequeas en el abdomen. Se utiliza un dispositivo controlado por computadora para que el cirujano vea una imagen en 3D. Esto permite movimientos ms precisos de los instrumentos quirrgicos. El tero se corta en trozos pequeos y se retira a travs de las incisiones o se elimina a travs de la vagina. RIESGOS DE LA HISTERECTOMA   Hemorragia y riesgos de necesitar una transfusin sangunea. Informe a su mdico si no quiere recibir productos de Risk manager.  Cogulos de [employer] piernas o  los pulmones.  Infecciones.  Lesin en los rganos circundantes.  Problemas o efectos secundarios por la anestesia.  Conversin a una histerectoma abdominal. QU ESPERAR DESPUES DE UNA HISTERECTOMA   Le administrarn medicamentos para calmar el dolor.  Necesitar que alguien permanezca con usted durante los primeros 3 a 5 das despus de regresar a Higher education careers adviser.  Tendr que hacer un control con su cirujano en 2 a 4 semanas despus de la ciruga para evaluar su progreso.  Podr tener sntomas tempranos de menopausia, como sofocos, sudores nocturnos e insomnio.  Si le han realizado una histerectoma por un problema que no era cncer u otra enfermedad que podra causar cncer, ya no necesitar un Papanicolaou. Sin embargo, si ya no necesita hacerse un Papanicolau, es una buena idea hacerse un examen regularmente para asegurarse de que no hay otros problemas. Document Released: 03/11/2005 Document Revised: 06/03/2011 [facility] Patient Information 2014 [place], [place]. Anemia, preguntas frecuentes (Anemia, Frequently Asked Questions) CULES SON LOS SNTOMAS DE LA ANEMIA?  Dolor de [place]  Dificultad para pensar.  Fatiga.  Falta de aire.  Debilidad.  Frecuencia cardaca rpida. EN QU PUNTO SE PUEDE CONSIDERAR A UNA PERSONA ANMICA?  Esto vara con el sexo y la edad.   Para definir la anemia se utilizan los valores de hemoglobina (Hgb) y Radiation protection practitioner. Estos valores de laboratorio se obtienen de un recuento completo de sangre (CBC) completo. Se realiza en el consultorio del mdico.  El rango normal de valores de hemoglobina para adultos hombres es de 14.0 g/dL a 62.9 g/dL. Para mujeres no embarazadas, los valores son de 12.3 g/dL a 52.8 g/dL.  La Organizacin Mundial de la Salud define el valor de la anemia en menos de 12 g/dL para mujeres no embarazadas y menos de 13 g/dL para hombres.  Para hombres adultos, el promedio normal de hematocrito es de 46% y el rango vara entre 40% y  52%.  Para mujeres adultas, el promedio normal de hematocrito es de 41% y el rango vara entre 35% y 47%.  Los valores que caen por debajo de los lmites normales pueden ser sntoma de anemia y deben tener un mayor control (evaluacin). GRUPOS DE PERSONAS QUE TIENEN UN MAYOR RIESGO DE DESARROLLAR ANEMIA:   Bebs que se alimentan del pecho de la madre o que ingieren preparado para bebs no fortificado con hierro.  Nio que estn atravesando un perodo de crecimiento rpido. El hierro disponible no puede cumplir con las necesidades de los glbulos rojos que deben crecer junto con el nio.  Mujeres en edad frtil. Necesitan hierro debido a la prdida de Radiation protection practitioner.  Mujeres embarazadas. El feto en crecimiento crea una alta demanda de hierro.  Las personas con hemorragia gastrointestinal continua estn en riesgo de desarrollar una deficiencia de hierro.  Individuos con leucemia o cncer que deben recibir quimioterapia o radiacin para tratar su enfermedad. Las drogas o radiacin utilizadas para tratar estas enfermedades a menudo disminuyen la capacidad de la mdula sea para producir todo  tipo de glbulos: sean glbulos rojos, blancos o plaquetas.  Individuos con enfermedades inflamatorias crnicas como artritis reumatoidea o infecciones crnicas.  Los ancianos. EXISTE ALGN TIPO DE ANEMIA QUE SEA HEREDITARIO?   Si, algunos tipos de anemia se deben a defectos heredados o genticos.  Anemia drepanoctica. Esto ocurre con ms frecuencia en personas descendientes de africanos, afro-americanos y Child psychotherapist.  Talasemia (o anemia de [place]). Este tipo de anemia se encuentran en personas descendientes de Child psychotherapist y del sur de [place]. Estos tipos de anemia son muy comunes.  Anemia de [place]. Este trastorno es muy poco comn. ES POSIBLE QUE CIERTOS MEDICAMENTOS PRODUZCAN ANEMIA?  S. Por ejemplo, las drogas para Consulting civil engineer (agentes qumico-teraputicos) a  menudo causan anemia. Estas drogas pueden disminuir la capacidad de la mdula sea para producir glbulos rojos. Si no hay suficientes glbulos rojos, el cuerpo no toma la cantidad [place] de oxgeno. QU NIVEL DE HEMATOCRITO ES SUFICIENTE PARA DONAR SANGRE?  La menor cantidad aceptable de hematocrito para donantes es de 38%. Si tiene Engineer, building services de hematocrito bajo, deber concertar una cita con el mdico. SUELEN REALIZARSE TRANSFUSIONES PARA TRATAR LA ANEMIA? SON PELIGROSAS?  Se utilizan como ltimo recurso para tratar la anemia. El Pensions consultant la causa de la anemia y de ser posible la corregir. La mayora de las transfusiones se realizan debido a una hemorragia excesiva durante una ciruga, por traumatismos, o debido a una supresin de la mdula sea en pacientes con cncer o leucemia durante la quimioterapia. Las transfusiones son mucho ms seguras que antes. Tambin es sabido que las transfusiones afectan al sistema inmune y pueden aumentar ciertos riesgos. Existe una posibilidad de error humano, en la que 1 de cada 16.000 transfusiones puede resultar en que el paciente reciba una transfusin que no concuerda con su tipo de [place].  QU ES LA DEFICIENCIA DE HIERRO? PUEDO CORREGIRLA CON UN CAMBIO EN MI DIETA?  El hierro es una parte esencial de la hemoglobina. Sin hemoglobina suficiente, se desarrolla anemia y el cuerpo no toma la cantidad suficiente de oxgeno. La anemia por deficiencia de hierro se desarrolla una vez que el cuerpo ha tenido un bajo nivel de hierro durante [employer]. Esto puede ocasionarse tanto por prdida de [place], no tomar o Barrister's clerk, o un aumento en la demanda de hierro (como durante el embarazo o el rpido Designer, jewellery).  Los alimentos de origen animal como carne de vaca, pollo o cerdo, son buenas fuentes de hierro. Asegrese de consumir uno de estos alimentos con cada comida. La vitamina C le ayuda a que el cuerpo absorba hierro. Los alimentos ricos  en vitamina C incluyen ctricos, pimientos, fresas, espinacas y cantalupos. En algunos casos puede ser necesario un suplemento de hierro para asegurarse de corregir la deficiencia de hierro. En el caso de una mala absorcin, el hierro extra deber administrarse directamente en la vena con una inyeccin (va intravenosa). ME HAN DIAGNOSTICADO ANEMIA POR DEFICIENCIA DE HIERRO Y EL MDICO ME HA RECETADO SUPLEMENTOS DE HIERRO. CUNTO TIEMPO DEBO TOMARLO PARA LLEGAR A LOS NIVELES NORMALES?  Depende del grado de anemia al comienzo del tratamiento. La mayora de las personas con deficiencia de hierro leve a moderada, Copy los niveles normales despus de 2 o 3 meses. Sin embargo una vez corregida la anemia, el hierro almacenado en el cuerpo sigue siendo bajo. Los mdicos suelen sugerir una terapia adicional de hierro por 6 meses una vez que se ha corregido la anemia. Esto ayudar a prevenir que la anemia por  deficiencia de hierro reaparezca rpidamente.  MI NIVEL DE HEMOGLOBINA ES DE 9 G/DL Y DEBO REALIZARME UNA CIRUGA. DEBERA POSPONERLA?  Si tiene un nivel de hemoglobina de 9, deber comentarlo de inmediato con el profesional que lo asiste. Muchos pacientes con niveles de hemoglobina similares han sido intervenidos quirrgicamente sin problemas. Si se espera que haya una mnima prdida de sangre de un procedimiento menor, no ser necesario tratamiento alguno.  Si se espera una prdida de [place] mayor para procedimientos ms extensivos, deber preguntar al mdico acerca de realizar un tratamiento con eritropoyetina y hierro para acelerar la recuperacin de la hemoglobina a niveles normales antes de la ciruga. Un paciente anmico que debe realizarse una ciruga que conlleva una gran prdida de sangre tiene un mayor riesgo de sufrir complicaciones y Pension scheme manager una transfusin, que tambin conlleva algn riesgo.  HE ODO QUE LOS PERODOS MENSTRUALES FUERTES CAUSAN ANEMIA. HAY ALGO QUE PUEDA HACER PARA  PREVERNIRLA?  La anemia que resulta de perodos con prdidas menstruales abundantes suele deberse a una deficiencia de hierro. Puede intentar cubrir la demanda creciente de hierro que se ocasiona por las prdidas menstruales fuertes mediante el aumento del consumo de alimentos ricos en hierro. Podrn ser necesarios suplementos de hierro. Consulte con el mdico si hay algo que le preocupa. QU OCASIONA LA ANEMIA DURANTE EL EMBARAZO?  El embarazo realiza grandes demandas al cuerpo. La madre debe cumplir con las necesidades tanto de su cuerpo como del beb por nacer. El cuerpo necesita hierro y folato suficientes para realizar una adecuada cantidad de glbulos rojos. Para prevenir la anemia durante el embarazo, la Museum/gallery conservator en contacto constante con el mdico.  Asegrese de consumir una dieta rica en hierro y folato como hgado y vegetales de Lawyer. El folato cumple un papel muy importante en el desarrollo normal de la mdula espinal del beb El folato puede ayudar a prevenir afecciones graves como la espina bfida. Si la dieta que consume no le proporciona los nutrientes adecuados, Programmer, multimedia con el mdico acerca del consumo de suplementos nutricionales.  CUL ES LA RELACIN ENTRE LOS TUMORES FIBROIDES Y LA ANEMIA EN LAS MUJERES?  La relacin suele estar ocasionada por un aumento en la prdida de flujo menstrual ocasionado por los fibroides. Se requerir una buena ingesta de hierro para prevenir su deficiencia y Automotive engineer que se desarrolle la anemia.  Document Released: 06/07/2008 Document Revised: 06/03/2011 [facility] Patient Information 2014 [place], [place]. Hemorragia uterina disfuncional (Dysfunctional Uterine Bleeding) Normalmente, los perodos menstruales comienzan en las jvenes de entre 11 y [place]. Un ciclo o perodo menstrual puede repetirse [employer] [address] y los [address] y dura [place] 1 y [address] [employer] [address] y los [address] antes de que comience el ciclo menstrual, se  produce la ovulacin (los ovarios producen vulos). Cuando cuente los periodos, hgalo desde Film/video editor del comienzo de la hemorragia del perodo anterior Engineering geologist de la hemorragia del perodo siguiente. La hemorragia uterina disfuncional (anormal) es diferente del perodo menstrual normal. Los perodos pueden comenzar antes o despus que lo habitual. Pueden ser menos abundantes, presentar cogulos, o ser ms abundantes. Puede tener [employer] perodos o Actor un perodo o ms. Puede tener hemorragia luego de [employer], despus de la menopausia o faltarle el perodo menstrual. CAUSAS  Embarazo (normal, aborto espontneo, embarazo ectpico).  DIU (dispositivo intrauterino, anticonceptivo)  Pldoras anticonceptivas  Tratamiento hormonal  La menopausia  Infeccin en el cervix.  Problemas de  coagulacin.  Infecciones de la superficie interna del tero.  Endometriosis: la superficie interna del tero se desarrolla en la pelvis y otros rganos femeninos.  Adherencias (tejido cicatrizal) dentro del tero.  Obesidad o severa prdida de peso.  Plipos en el tero.  Cncer en el crvix, la vagina o el tero.  Quiste de ovarios o Sndrome ovrico poliqustico.  Otras enfermedades (diabetes, enfermedad de la tiroides, Catering manager).  Fibromas en el tero (tumores no cancerosos).  Problemas con las hormonas femeninas.  Hiperplasia del endometrio: capa gruesa y clulas agrandadas dentro del tero.  Medicamentos que interfieren con la ovulacin.  Radiacin en la pelvis o el abdomen.  Quimioterapia. DIAGNSTICO  El mdico Radio broadcast assistant historia de sus perodos [place], los medicamentos que toma, los cambios en el peso corporal, el estrs de la vida diaria y cualquier problema mdico que tenga.  El mdico realizar un examen fsico, incluyendo un examen plvico.  Tambin le solicitar anlisis para completar el diagnstico. Ellos  son:  Papanicolau.  Anlisis de [place].  Cultivos para descartar infecciones.  Tomografa computada.  Ultrasonido.  Histeroscopa.  Laparoscopa.  Resonancia magntica.  Histerosalpingografa.  Dilatacin y curetaje.  Biopsia de endometrio. TRATAMIENTO El tratamiento depender de la causa de la hemorragia.. El tratamiento consiste en:  Observacin de los perodos menstruales durante algunos meses.  Prescripcin de medicamentos como:  Antibiticos:  Hormonas.  Pldoras anticonceptivas  Retirar el DIU (dispositivo intrauterino, anticonceptivo).  Ciruga:  Dilatacin y curetaje (raspado y remocin de tejido de la zona interna del tero).  Laparoscopa (examen del interior del abdomen con un tubo con luz).  Ablacin uterina (destruccin de la membrana que cubre el tero con corriente elctrica, rayos lser, congelamiento o calor ).  Histeroscopa (examen del cuello y el tero con un tubo con luz).  Histerectoma (extirpacin del tero). INSTRUCCIONES PARA EL CUIDADO DOMICILIARIO  Si el profesional que la asiste le prescribe medicamentos, tmelos tal como se le indic. No cambie ni reemplace medicamentos sin consultarlo con Mining engineer.  Las hemorragias de larga duracin pueden traen como consecuencia un dficit de hierro. El profesional que lo asiste podr Pharmacist, hospital. Esto ayuda a Scientific laboratory technician que el organismo pierde luego de una hemorragia abundante. Tome los medicamentos tal como se le indic.  No tome aspirina o medicamentos que la contengan u desde una semana antes del perodo menstrual ni durante el mismo. La aspirina puede hacer que la hemorragia empeore.  Si necesita cambiar el apsito o el tampn mas de una vez cada 2 horas, permanezca en cama con los pies elevados y aplique una compresa fra en la zona baja del abdomen. Descanse todo lo que pueda hasta que la hemorragia se detenga.  Consuma alimentos balanceados. Coma alimentos ricos en  hierro. Por ejemplo:  Vegetales verdes frescos.  Cereales integrales y cereales y panes con salvado.  Huevos.  Carnes.  Hgado.  No trate de perder peso hasta que la hemorragia anormal se detenga y los niveles de hierro en la sangre vuelvan a la normalidad. No levante pesos de ms de 10 libras (4.5 kg.) ni realice actividades extenuantes mientras tenga la hemorragia.  Durante un par de meses tome nota en un almanaque y [employer] comienzo y el fin de su perodo menstrual y el tipo de sangrado (escaso, [place], [place], gotas, cogulos o falta de perodo). El objetivo es que su mdico evale mejor el problema. SOLICITE ATENCIN MDICA SI:  Debe cambiar el apsito o el tampn ms de una vez cada hora.  Se siente mareada o dbil.  Tiene algn problema que pueda relacionarse con el medicamento que est tomando.  Siente dolor.  Desea retirar su DIU.  Quiere suspender o cambiar sus pldoras anticonceptivas u hormonas.  Tiene algn tipo de hemorragia anormal mencionada anteriormente.  Tiene ms de 83 aos y an no ha tenido su perodo menstrual.  Tiene 66 aos y an tiene Environmental manager.  Tiene alguno de los sntomas mencionados anteriormente.  Aparece una erupcin cutnea. SOLICITE ATENCIN MDICA DE INMEDIATO SI:  La temperatura oral se eleva sin motivo por encima de 38,9 C (102 F).  Comienza a sentir escalofros.  Debe cambiar el apsito o el tampn ms de una vez cada hora.  Siente dolor abdominal.  Pierde el conocimiento, se desmaya. Document Released: 12/19/2004 Document Revised: 12/04/2011 [facility] Patient Information 2014 [place], [place].

## 2012-08-26 ENCOUNTER — Telehealth: Payer: Self-pay | Admitting: *Deleted

## 2012-08-26 NOTE — Telephone Encounter (Signed)
Called patient and left message for her to return our call.

## 2012-08-26 NOTE — Telephone Encounter (Signed)
Message copied by Carmen Burns on Wed Aug 26, 2012  4:26 PM ------      Message from: Carmen Burns      Created: Tue Aug 25, 2012  4:23 PM       Please call pt.  Let her know that her blood count is up to 36%.  She should continue the OCP's prescribed.            Need Spanish interpreter      clh-S ------

## 2012-08-27 NOTE — Telephone Encounter (Signed)
Called [place] with Interpreter Doria and notified her blood count and continue ocp's. Also reviewed with her plan to follow up in 2-3 months or sooner if heavy bleeding returns. Patient states she is working on the financial assistance paperwork. Patient voices understanding with plan.

## 2012-09-07 ENCOUNTER — Other Ambulatory Visit: Payer: Self-pay | Admitting: Obstetrics & Gynecology

## 2012-09-07 DIAGNOSIS — Z1231 Encounter for screening mammogram for malignant neoplasm of breast: Secondary | ICD-10-CM

## 2012-09-16 ENCOUNTER — Ambulatory Visit (HOSPITAL_COMMUNITY)
Admission: RE | Admit: 2012-09-16 | Discharge: 2012-09-16 | Disposition: A | Discharge status: Home / Self Care | Payer: Self-pay | Source: Ambulatory Visit | Attending: Obstetrics & Gynecology | Admitting: Obstetrics & Gynecology

## 2012-09-16 DIAGNOSIS — Z1231 Encounter for screening mammogram for malignant neoplasm of breast: Secondary | ICD-10-CM

## 2012-11-18 ENCOUNTER — Ambulatory Visit (INDEPENDENT_AMBULATORY_CARE_PROVIDER_SITE_OTHER): Payer: Self-pay | Admitting: Obstetrics & Gynecology

## 2012-11-18 VITALS — BP 151/92 | HR 84 | Temp 97.5°F | Ht 61.0 in | Wt 215.6 lb

## 2012-11-18 DIAGNOSIS — D62 Acute posthemorrhagic anemia: Secondary | ICD-10-CM

## 2012-11-18 DIAGNOSIS — Z609 Problem related to social environment, unspecified: Secondary | ICD-10-CM

## 2012-11-18 DIAGNOSIS — Z789 Other specified health status: Secondary | ICD-10-CM

## 2012-11-18 DIAGNOSIS — N8003 Adenomyosis of the uterus: Secondary | ICD-10-CM

## 2012-11-18 DIAGNOSIS — D649 Anemia, unspecified: Secondary | ICD-10-CM

## 2012-11-18 DIAGNOSIS — Z603 Acculturation difficulty: Secondary | ICD-10-CM

## 2012-11-18 DIAGNOSIS — N800 Endometriosis of uterus: Secondary | ICD-10-CM

## 2012-11-18 DIAGNOSIS — N938 Other specified abnormal uterine and vaginal bleeding: Secondary | ICD-10-CM

## 2012-11-18 DIAGNOSIS — E669 Obesity, unspecified: Secondary | ICD-10-CM

## 2012-11-18 DIAGNOSIS — N949 Unspecified condition associated with female genital organs and menstrual cycle: Secondary | ICD-10-CM

## 2012-11-18 MED ORDER — MEGESTROL ACETATE 40 MG PO TABS: 80.0000 mg | ORAL_TABLET | Freq: Two times a day (BID) | ORAL | Status: DC

## 2012-11-18 NOTE — Patient Instructions (Signed)
Regrese a la clinica cuando tenga su cita. Si tiene problemas o preguntas, llama a la clinica o vaya a la sala de emergencia al [facility].    

## 2012-11-18 NOTE — Progress Notes (Signed)
States not taking blood pressure med and diabetes med because she ran out and has to see the doctor again.

## 2012-11-19 ENCOUNTER — Encounter: Payer: Self-pay | Admitting: Obstetrics & Gynecology

## 2012-11-19 DIAGNOSIS — Z789 Other specified health status: Secondary | ICD-10-CM | POA: Insufficient documentation

## 2012-11-19 NOTE — Progress Notes (Signed)
GYNECOLOGY CLINIC PROGRESS NOTE  History:  52 y.o. [id number] here today for follow up after evaluation of perimenopausal AUB.  Patient is Spanish-speaking only, Spanish interpreter present for this encounter. She has been on OCPs that were prescribed last visit which she said has not helped with the amount of bleeding she has during her menstrual periods.  She has tried Megace 40mg   Bid in the past but this did not help; also tried Provera 10 mg daily which also did not help. 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  Last hemoglobin check on 08/24/12 was 112.5; she is on iron therapy.  Patient says she does not want medical management anymore, she is interested in endometrial ablation.  She denies any current symptoms of anemia.  The following portions of the patient's history were reviewed and updated as appropriate: allergies, current medications, past family history, past medical history, past social history, past surgical history and problem list. Normal pap earlier in the year as per patient.  Review of Systems:  Pertinent items are noted in HPI.  Objective:  Physical Exam BP 151/92  Pulse 84  Temp(Src) 97.5 F (36.4 C)  Ht 5\' 1"  (1.549 m)  Wt 215 lb 9.6 oz (97.796 kg)  BMI 40.76 kg/m2  LMP 10/30/2012 Gen: NAD Lungs: CTAB Heart: RRR Abd: Soft, nontender and nondistended Ext: No c/c/e Pelvic: Deferred   05/01/2012 Pelvic Ultrasound Comparison: 07/09/2010 Findings: Uterus: The uterus is enlarged with a sagittal length of 10.8 cm, depth of 6.7 cm and width of 9.4 cm. The myometrium is diffusely heterogeneous and demonstrates posterior acoustical shadowing throughout. No focal myometrial abnormality is seen. The endometrial myometrial interface is not well delineated and the appearance is compatible with diffuse adenomyosis.  Endometrium: Cannot be adequately visualized for  accurate measurement. Right ovary: Is not seen with confidence either transabdominally or endovaginally.  Left ovary: Is not seen with confidence either transabdominally or endovaginally.  Other findings: Unilocular thin-walled simple cyst in the left adnexa measuring 2.3 x 1.4 x 1.3 cm. A similar finding was seen on the prior exam this likely represents a benign paraovarian cyst. No pelvic fluid is noted.  IMPRESSION: Findings compatible with adenomyosis resulting in nonvisualization of the endometrium with this study. Non-visualized ovaries.  Thin walled unilocular left adnexal cyst appears stable since the previous exam. Given the appearance and premenopausal status of the patient, no further follow-up for this benign finding is needed.   05/05/2012 Endometrium, biopsy - BENIGN SECRETORY ENDOMETRIUM, NO ATYPIA, HYPERPLASIA OR MALIGNANCY   Assessment & Plan:  Patient desires endometrial ablation for AUB.  Discussed management options for abnormal uterine bleeding including oral progesterone, Depo Provera, Mirena IUD, endometrial ablation (Novasure/Hydrothermal Ablation) or hysterectomy as definitive surgical management.  Discussed risks and benefits of each method. Also informed patient that it is very likely she was not given enough progestin; an increased dosage may work better for her.  Patient wants to proceed with Novasure; but will try increased progestin therapy for now until the ablation.  Megace 80 mg po bid prescribed as needed for now, bleeding precautions reviewed.  The risks of surgery were discussed in detail with the patient including but not limited to: bleeding which may require transfusion; infection which may require antibiotics; injury to uterus leading to risk of injury to surrounding intraperitoneal organs, need for additional procedures including laparoscopy or laparotomy, and other postoperative/anesthesia complications.  Patient was  told that the likelihood that her condition and  symptoms will be treated effectively with this surgical management was very high; the postoperative expectations were also discussed in detail. The patient also understands the alternative treatment options which were discussed in full. All questions were answered.  She was told that she will be contacted by our surgical scheduler regarding the time and date of her surgery.  She was told she may be called for a preoperative appointment about a week prior to surgery and will be given further preoperative instructions at that visit. Printed patient education handouts about the procedure were given to the patient to review at home.

## 2012-11-20 ENCOUNTER — Ambulatory Visit: Payer: Self-pay

## 2012-11-25 ENCOUNTER — Encounter: Payer: Self-pay | Admitting: Internal Medicine

## 2012-11-25 ENCOUNTER — Ambulatory Visit: Payer: Self-pay

## 2012-11-25 ENCOUNTER — Ambulatory Visit: Payer: Self-pay | Attending: Internal Medicine | Admitting: Internal Medicine

## 2012-11-25 VITALS — BP 178/103 | HR 79 | Temp 99.0°F | Resp 16 | Ht 64.96 in | Wt 217.0 lb

## 2012-11-25 DIAGNOSIS — I10 Essential (primary) hypertension: Secondary | ICD-10-CM | POA: Insufficient documentation

## 2012-11-25 DIAGNOSIS — B351 Tinea unguium: Secondary | ICD-10-CM | POA: Insufficient documentation

## 2012-11-25 DIAGNOSIS — N938 Other specified abnormal uterine and vaginal bleeding: Secondary | ICD-10-CM

## 2012-11-25 DIAGNOSIS — E119 Type 2 diabetes mellitus without complications: Secondary | ICD-10-CM | POA: Insufficient documentation

## 2012-11-25 DIAGNOSIS — N949 Unspecified condition associated with female genital organs and menstrual cycle: Secondary | ICD-10-CM

## 2012-11-25 DIAGNOSIS — D649 Anemia, unspecified: Secondary | ICD-10-CM | POA: Insufficient documentation

## 2012-11-25 DIAGNOSIS — Z23 Encounter for immunization: Secondary | ICD-10-CM

## 2012-11-25 MED ORDER — METFORMIN HCL 500 MG PO TABS: 500.0000 mg | ORAL_TABLET | Freq: Two times a day (BID) | ORAL | Status: DC

## 2012-11-25 MED ORDER — LISINOPRIL-HYDROCHLOROTHIAZIDE 10-12.5 MG PO TABS: 1.0000 | ORAL_TABLET | Freq: Every day | ORAL | Status: DC

## 2012-11-25 NOTE — Progress Notes (Signed)
Pt is here to establish care.  She reports of having high Blood pressure. She has diabetes and reports of high sugar levels. The pt is also suffering from fungus under the nails with both feet and hands.

## 2012-11-25 NOTE — Progress Notes (Signed)
Patient Demographics  Carmen Burns, is a 52 y.o. female  ZOX:[id number]  WJX:[id number]  DOB - [birth year]  Chief Complaint  Patient presents with  . Establish Care        Subjective:   Carmen Burns today is here to establish primary care. She has a PMHx of HTN and DM-she has been non compliant to meds-last took was in July. Has hx of adenomyosis with menorrhagia and is currently getting treatment at the [facility] clinic.  Currently patient has no complaints. Patient has also has No headache, No chest pain, No abdominal pain,No Nausea, No new weakness tingling or numbness, No Cough or SOB.  Objective:    Filed Vitals:   11/25/12 1237  BP: 178/103  Pulse: 79  Temp: 99 F (37.2 C)  TempSrc: Oral  Resp: 16  Height: 5' 4.96" (1.65 m)  Weight: 217 lb (98.431 kg)  SpO2: 97%     ALLERGIES:   Allergies  Allergen Reactions  . Penicillins Rash    PAST MEDICAL HISTORY: Past Medical History  Diagnosis Date  . Hypertension   . Hyperlipidemia   . Diabetes mellitus without complication   . Anemia     PAST SURGICAL HISTORY: History reviewed. No pertinent past surgical history.  FAMILY HISTORY: Family History  Problem Relation Age of Onset  . Diabetes Brother   . Hypertension Brother     MEDICATIONS AT HOME: Prior to Admission medications   Medication Sig Start Date End Date Taking? Authorizing Provider  lisinopril-hydrochlorothiazide (PRINZIDE,ZESTORETIC) 20-25 MG per tablet Take 1 tablet by mouth daily.   Yes Historical Provider, MD  norgestimate-ethinyl estradiol (ORTHO-CYCLEN,SPRINTEC,PREVIFEM) 0.25-35 MG-MCG tablet Take 1 tablet by mouth daily. 08/24/12  Yes Willodean Rosenthal, MD  Fe Fum-FePoly-FA-Vit C-Vit B3 (INTEGRA F) 125-1 MG CAPS Take 1 capsule by mouth daily. 06/01/12   Willodean Rosenthal, MD  ferrous sulfate 325 (65 FE) MG tablet Take 325 mg by mouth daily with breakfast.    Historical Provider, MD  lisinopril-hydrochlorothiazide  (PRINZIDE,ZESTORETIC) 10-12.5 MG per tablet Take 1 tablet by mouth daily. 11/25/12   Verdia Bolt Levora Dredge, MD  megestrol (MEGACE) 40 MG tablet Take 2 tablets (80 mg total) by mouth 2 (two) times daily. 11/18/12   Tereso Newcomer, MD  metFORMIN (GLUCOPHAGE) 500 MG tablet Take 1 tablet (500 mg total) by mouth 2 (two) times daily with a meal. 11/25/12   Maretta Bees, MD  Multiple Vitamin (MULTIVITAMIN WITH MINERALS) TABS Take 1 tablet by mouth daily.    Historical Provider, MD  naproxen sodium (ANAPROX) 220 MG tablet Take 440 mg by mouth daily as needed. For pain    Historical Provider, MD    SOCIAL HISTORY:   reports that she has never smoked. She has never used smokeless tobacco. She reports that she does not drink alcohol or use illicit drugs.  REVIEW OF SYSTEMS:  Constitutional:   No   Fevers, chills, fatigue.  HEENT:    No headaches, Sore throat,   Cardio-vascular: No chest pain,  Orthopnea, swelling in lower extremities, anasarca, palpitations  GI:  No abdominal pain, nausea, vomiting, diarrhea  Resp: No shortness of breath,  No coughing up of blood.No cough.No wheezing.  Skin:  no rash or lesions.  GU:  no dysuria, change in color of urine, no urgency or frequency.  No flank pain.  Musculoskeletal: No joint pain or swelling.  No decreased range of motion.  No back pain.  Psych: No change in mood or affect. No depression or anxiety.  No  memory loss.   Exam  General appearance :Awake, alert, not in any distress. Speech Clear. Not toxic Looking HEENT: Atraumatic and Normocephalic, pupils equally reactive to light and accomodation Neck: supple, no JVD. No cervical lymphadenopathy.  Chest:Good air entry bilaterally, no added sounds  CVS: S1 S2 regular, no murmurs.  Abdomen: Bowel sounds present, Non tender and not distended with no gaurding, rigidity or rebound. Extremities: B/L Lower Ext shows no edema, both legs are warm to touch Neurology: Awake alert, and oriented X  3, CN II-XII intact, Non focal Skin:No Rash Wounds:N/A    Data Review   CBC No results found for this basename: WBC, HGB, HCT, PLT, MCV, MCH, MCHC, RDW, NEUTRABS, LYMPHSABS, MONOABS, EOSABS, BASOSABS, BANDABS, BANDSABD,  in the last 168 hours  Chemistries   No results found for this basename: NA, K, CL, CO2, GLUCOSE, BUN, CREATININE, GFRCGP, CALCIUM, MG, AST, ALT, ALKPHOS, BILITOT,  in the last 168 hours ------------------------------------------------------------------------------------------------------------------ No results found for this basename: HGBA1C,  in the last 72 hours ------------------------------------------------------------------------------------------------------------------ No results found for this basename: CHOL, HDL, LDLCALC, TRIG, CHOLHDL, LDLDIRECT,  in the last 72 hours ------------------------------------------------------------------------------------------------------------------ No results found for this basename: TSH, T4TOTAL, FREET3, T3FREE, THYROIDAB,  in the last 72 hours ------------------------------------------------------------------------------------------------------------------ No results found for this basename: VITAMINB12, FOLATE, FERRITIN, TIBC, IRON, RETICCTPCT,  in the last 72 hours  Coagulation profile  No results found for this basename: INR, PROTIME,  in the last 168 hours    Assessment & Plan   DM type 2 -start Metformin -follow up in 1 month for further optimization  HTN -start Lisinopril/HCTZ 10/12.5 --follow up in 1 month for further optimization  Anemia -2/2 menorrhagia -defer management to women's clinc -c/w Fe supplementation  Onychomycosis -check LFT's-and then start anti-fungals next visit  Health Maintenance -Colonoscopy:refer to GI -Pap Smear:defer to GYN-already established with the wom[facility] clinic -Mammogram:uptodate-need to repeat 08/2013 -Vaccinations:Flu vaccine today  Follow up in 1 month-will check  basic labs before this visit-please follow  The patient was given clear instructions to go to ER or return to medical center if symptoms don't improve, worsen or new problems develop. The patient verbalized understanding. The patient was told to call to get lab results if they haven't heard anything in the next week.

## 2012-12-31 ENCOUNTER — Ambulatory Visit (INDEPENDENT_AMBULATORY_CARE_PROVIDER_SITE_OTHER): Payer: Self-pay | Admitting: Obstetrics & Gynecology

## 2012-12-31 ENCOUNTER — Encounter: Payer: Self-pay | Admitting: Obstetrics & Gynecology

## 2012-12-31 VITALS — BP 163/95 | HR 92 | Temp 97.3°F | Ht 62.0 in | Wt 213.6 lb

## 2012-12-31 DIAGNOSIS — N938 Other specified abnormal uterine and vaginal bleeding: Secondary | ICD-10-CM

## 2012-12-31 DIAGNOSIS — N8003 Adenomyosis of the uterus: Secondary | ICD-10-CM

## 2012-12-31 DIAGNOSIS — Z758 Other problems related to medical facilities and other health care: Secondary | ICD-10-CM

## 2012-12-31 DIAGNOSIS — N800 Endometriosis of uterus: Secondary | ICD-10-CM

## 2012-12-31 DIAGNOSIS — D62 Acute posthemorrhagic anemia: Secondary | ICD-10-CM

## 2012-12-31 DIAGNOSIS — Z789 Other specified health status: Secondary | ICD-10-CM

## 2012-12-31 DIAGNOSIS — N949 Unspecified condition associated with female genital organs and menstrual cycle: Secondary | ICD-10-CM

## 2012-12-31 DIAGNOSIS — Z609 Problem related to social environment, unspecified: Secondary | ICD-10-CM

## 2012-12-31 NOTE — Progress Notes (Signed)
GYNECOLOGY CLINIC PROGRESS NOTE  History:   51 y.o. [id number] here today for preoperative appointment for scheduled Novasure endometrial ablation on 01/12/13.  Patient is Spanish-speaking only, Spanish interpreter present for this encounter.  She denies any current symptoms of anemia.    The following portions of the patient's history were reviewed and updated as appropriate: allergies, current medications, past family history, past medical history, past social history, past surgical history and problem list. Normal pap earlier in the year as per patient.  Objective:   BP 163/95  Pulse 92  Temp(Src) 97.3 F (36.3 C)  Ht 5' 2" (1.575 m)  Wt 213 lb 9.6 oz (96.888 kg)  BMI 39.06 kg/m2  LMP 12/26/2012 Physical Exam deferred   Studies 05/01/2012 Pelvic Ultrasound Comparison: 07/09/2010 Findings: Uterus: The uterus is enlarged with a sagittal length of 10.8 cm, depth of 6.7 cm and width of 9.4 cm. The myometrium is diffusely heterogeneous and demonstrates posterior acoustical shadowing throughout. No focal myometrial abnormality is seen. The endometrial myometrial interface is not well delineated and the appearance is compatible with diffuse adenomyosis. Endometrium: Cannot be adequately visualized for accurate measurement. Right ovary: Is not seen with confidence either transabdominally or endovaginally. Left ovary: Is not seen with confidence either transabdominally or endovaginally. Other findings: Unilocular thin-walled simple cyst in the left adnexa measuring 2.3 x 1.4 x 1.3 cm. A similar finding was seen on the prior exam this likely represents a benign paraovarian cyst. No pelvic fluid is noted.  IMPRESSION: Findings compatible with adenomyosis resulting in nonvisualization of the endometrium with this study. Non-visualized ovaries. Thin walled unilocular left adnexal cyst appears stable since the previous exam. Given the appearance and premenopausal status of the patient, no further follow-up for  this benign finding is needed.   05/05/2012 Endometrium, biopsy - BENIGN SECRETORY ENDOMETRIUM, NO ATYPIA, HYPERPLASIA OR MALIGNANCY   Assessment & Plan:    The risks of Novasure endometrial ablation were discussed in detail with the patient including but not limited to: bleeding which may require transfusion; infection which may require antibiotics; injury to uterus leading to risk of injury to surrounding intraperitoneal organs, need for additional procedures including laparoscopy or laparotomy, and other postoperative/anesthesia complications. Patient was told that the likelihood that her condition and symptoms will be treated effectively with this surgical management was very high; the postoperative expectations were also discussed in detail.  Routine preoperative instructions of having nothing to eat or drink after midnight on the day prior to surgery and also coming to the hospital 1.5 hours prior to her time of surgery were also emphasized.  All questions were answered. Printed patient education handouts about the procedure were given to the patient to review at home. 

## 2012-12-31 NOTE — Patient Instructions (Signed)
Ablacin endometrial  La ablacin endometrial elimina el revestimiento del tero (endometrio). Por lo general, es un mismo da, el tratamiento ambulatorio. Ablacin ayuda a evitar la [place] mayor (tal como una histerectoma). Una histerectoma es la extirpacin del cuello uterino y [place]. La ablacin endometrial tiene menos riesgos y complicaciones, tiene un perodo de recuperacin ms corto y es menos costoso. Despus de la ablacin endometrial, la mayora de las mujeres tendrn poco o ningn sangrado menstrual. Usted no puede mantener su fertilidad. El embarazo ya no es probable que despus de este procedimiento, pero si usted es pre-menopausia, usted todava necesita usar un mtodo anticonceptivo confiable siguiendo el procedimiento porque se puede Architectural technologist.  RAZONES PARA TENER EL PROCEDIMIENTO PUEDEN INCLUIR:  Las menstruaciones fuertes.  Sangrado que est causando la anemia.  Sangrado anovulatorio, muy irregular, sangrado.  Sangrado miomas submucosos (en el revestimiento interior del tero) si son menos de 3 centmetros.  RAZONES PARA NO TENER EL PROCEDIMIENTO PUEDEN INCLUIR:  Usted desea tener ms hijos.  Usted tiene un problema precanceroso o canceroso. La causa de sangrado anormal debe ser diagnosticado antes de someterse al procedimiento.  Tiene dolor que viene del tero.  Usted tiene un fibroma submucosa ms de 3 centmetros.  Usted ha tenido recientemente un beb.  Usted ha tenido recientemente una infeccin en el tero.  Usted tiene un retro-flexin, el tero punta [place] y no puede insertar el instrumento para Government social research officer.  Usted tuvo una cesrea o ciruga mayor profundidad en el tero.  La cavidad interna del tero es demasiado grande para el instrumento de ablacin endometrial.  RIESGOS Y COMPLICACIONES  Perforacin del tero.  Sangrado.  La infeccin del tero, la vejiga o la vagina.  Lesin a los rganos circundantes.  Cortar el cuello uterino.  Una  burbuja de aire en el pulmn (embolia gaseosa).  Embarazo despus del procedimiento.  El fracaso del procedimiento para ayudar al problema que requiere histerectoma.  Disminucin de la capacidad para Arts administrator en el revestimiento del tero.  ANTES DEL PROCEDIMIENTO  El revestimiento del tero debe ser probado para asegurarse de que no hay clulas precancerosas o cancerosas presentes.  Se pueden administrar medicamentos para que el revestimiento del tero ms delgada.  El ultrasonido puede ser utilizado para Heritage manager tamao y buscar anomalas del tero.  Futuro embarazo no es deseado.  PROCEDIMIENTO  Hay diferentes maneras de destruir el revestimiento del tero.  Resectoscopio - radiofrecuencia corriente elctrica alterna es la ms comn utilizado.  Crioterapia - congelar el revestimiento del tero.  Climatizada Gratis lquidos - solucin salina caliente (solucin salina) inserta en el tero.  Microondas - [place] microondas de alta energa en el tero.  Baln trmico - un catter con una punta de globo se inserta en el tero y llena con fluido caliente.  Su mdico hablar con usted sobre el mtodo utilizado en esta clnica. Ellos tambin [employer] pros y los contras del procedimiento. La ablacin endometrial se realiza junto con un procedimiento llamado histeroscopia [place]. Un tubo de visin estrecho se inserta a travs del canal de parto (vagina) ya travs del cuello uterino hasta el tero. Una pequea cmara conectada a la sonda de observacin (histeroscopio) permite que la cavidad uterina que se [place] en un monitor de TV durante la [place]. Su tero se llena con un lquido inofensivo para hacer el procedimiento ms fcil. Despus se retira el revestimiento del tero. El revestimiento tambin se puede quitar con un resectoscopio que le permite  al Carmen Burns cortar el revestimiento del tero bajo visin directa. Por lo general, usted ser capaz de volver a casa dentro de una  hora despus del procedimiento.  INSTRUCCIONES DE CUIDADO EN EL HOGAR  No conduzca durante 24 horas.  No tampones, duchas vaginales o el coito durante 2 semanas o hasta que su mdico lo apruebe.  Descanse en su casa durante 24 a 48 horas. A continuacin, puede reanudar sus actividades normales a menos que de [place] por su mdico.  Tmese la [employer] veces al da durante 4 [place], y [place].  [employer] que su mdico ha Carmen, como se indica.  Utilice alguna forma de anticoncepcin si est pre-menopusica y no desea quedar embarazada.  El sangrado despus del procedimiento es normal. Vara de un manchado ligero y ligeramente acuosa a secrecin sanguinolenta durante 4 a 6 semanas. Usted tambin puede tener clicos leves. Slo tomar over-the-counter o de la prescripcin de medicamentos para el dolor, la incomodidad, o fiebre como lo indique su mdico. No use aspirina, ya que esto puede agravar la hemorragia. Miccin frecuente durante las primeras 24 horas es normal. Usted no sabr el grado de eficacia de la ciruga es hasta por lo menos 3 meses despus de la [place].  BUSQUE ATENCIN MDICA DE INMEDIATO SI:  El sangrado es ms pesado que un ciclo menstrual normal.  Una temperatura oral por encima de 102  F (38.9  C) se desarrolla.  Has aumentar los calambres o dolores que no se alivian con medicamentos o Environmental education officer vientre (abdominal) que no parece estar relacionado con la misma rea de calambres y dolor anterior.  Usted est mareado, dbil o tiene episodios de [place].  Usted [place] en las reas de la correa de hombro.  Usted tiene el pecho o dolor en las piernas.  Usted tiene flujo vaginal anormal.  Usted tiene dolor al [employer].  Documento de lanzamiento: 01/19/2004 el documento revisado el: 06/03/2011 Documento de Resea: 04/18/2007  [facility]  [address] [place].

## 2013-01-05 ENCOUNTER — Encounter (HOSPITAL_COMMUNITY)
Admission: RE | Admit: 2013-01-05 | Discharge: 2013-01-05 | Disposition: A | Discharge status: Home / Self Care | Payer: Self-pay | Source: Ambulatory Visit | Attending: Obstetrics & Gynecology | Admitting: Obstetrics & Gynecology

## 2013-01-05 ENCOUNTER — Encounter (HOSPITAL_COMMUNITY): Payer: Self-pay

## 2013-01-05 DIAGNOSIS — Z01812 Encounter for preprocedural laboratory examination: Secondary | ICD-10-CM | POA: Insufficient documentation

## 2013-01-05 LAB — CBC
HCT: 37.3 % (ref 36.0–46.0)
Hemoglobin: 12.3 g/dL (ref 12.0–15.0)
MCH: 28.2 pg (ref 26.0–34.0)
MCHC: 33 g/dL (ref 30.0–36.0)
Platelets: 311 10*3/uL (ref 150–400)
RBC: 4.36 MIL/uL (ref 3.87–5.11)

## 2013-01-05 LAB — BASIC METABOLIC PANEL
BUN: 10 mg/dL (ref 6–23)
CO2: 26 mEq/L (ref 19–32)
Chloride: 99 mEq/L (ref 96–112)
Creatinine, Ser: 0.64 mg/dL (ref 0.50–1.10)
GFR calc non Af Amer: >90 mL/min (ref >90)
Glucose, Bld: 179 mg/dL — ABNORMAL HIGH (ref 70–99)
Potassium: 4.5 mEq/L (ref 3.5–5.1)
Sodium: 136 mEq/L (ref 135–145)

## 2013-01-05 NOTE — Patient Instructions (Addendum)
Your procedure is scheduled on:01/12/13  Enter through the Main Entrance at : 1:00 pm Pick up desk phone and dial [phone] and inform us of your arrival.  Please call [phone] if you have any problems the morning of surgery.  Remember: Do not eat food after midnight:MONDAY Clear liquids are ok until:1030 am Tuesday   You may brush your teeth the morning of surgery.  Take these meds the morning of surgery with a sip of water:Lisinopril HOLD METFORMIN FOR 24 HOURS PRIOR TO SURGERY  DO NOT wear jewelry, eye make-up, lipstick,body lotion, or dark fingernail polish.  (Polished toes are ok) You may wear deodorant.  If you are to be admitted after surgery, leave suitcase in car until your room has been assigned. Patients discharged on the day of surgery will not be allowed to drive home. Wear loose fitting, comfortable clothes for your ride home.

## 2013-01-12 ENCOUNTER — Encounter (HOSPITAL_COMMUNITY)
Admission: RE | Disposition: A | Discharge status: Home / Self Care | Payer: Self-pay | Source: Ambulatory Visit | Attending: Obstetrics & Gynecology

## 2013-01-12 ENCOUNTER — Ambulatory Visit (HOSPITAL_COMMUNITY)
Admission: RE | Admit: 2013-01-12 | Discharge: 2013-01-12 | Disposition: A | Discharge status: Home / Self Care | Payer: Self-pay | Source: Ambulatory Visit | Attending: Obstetrics & Gynecology | Admitting: Obstetrics & Gynecology

## 2013-01-12 ENCOUNTER — Ambulatory Visit (HOSPITAL_COMMUNITY): Payer: Self-pay | Admitting: Anesthesiology

## 2013-01-12 ENCOUNTER — Encounter (HOSPITAL_COMMUNITY): Payer: Self-pay | Admitting: Anesthesiology

## 2013-01-12 ENCOUNTER — Encounter (HOSPITAL_COMMUNITY): Payer: Self-pay | Admitting: Pharmacy Technician

## 2013-01-12 DIAGNOSIS — D62 Acute posthemorrhagic anemia: Secondary | ICD-10-CM | POA: Diagnosis present

## 2013-01-12 DIAGNOSIS — N938 Other specified abnormal uterine and vaginal bleeding: Secondary | ICD-10-CM | POA: Diagnosis present

## 2013-01-12 DIAGNOSIS — N949 Unspecified condition associated with female genital organs and menstrual cycle: Secondary | ICD-10-CM | POA: Insufficient documentation

## 2013-01-12 DIAGNOSIS — IMO0001 Reserved for inherently not codable concepts without codable children: Secondary | ICD-10-CM

## 2013-01-12 DIAGNOSIS — Z531 Procedure and treatment not carried out because of patient's decision for reasons of belief and group pressure: Secondary | ICD-10-CM

## 2013-01-12 DIAGNOSIS — Z758 Other problems related to medical facilities and other health care: Secondary | ICD-10-CM

## 2013-01-12 DIAGNOSIS — Z789 Other specified health status: Secondary | ICD-10-CM

## 2013-01-12 DIAGNOSIS — N8003 Adenomyosis of the uterus: Secondary | ICD-10-CM | POA: Diagnosis present

## 2013-01-12 DIAGNOSIS — N800 Endometriosis of uterus: Secondary | ICD-10-CM | POA: Diagnosis present

## 2013-01-12 HISTORY — PX: DILITATION & CURRETTAGE/HYSTROSCOPY WITH NOVASURE ABLATION: SHX5568

## 2013-01-12 LAB — GLUCOSE, CAPILLARY: Glucose-Capillary: 214 mg/dL — ABNORMAL HIGH (ref 70–99)

## 2013-01-12 LAB — PREGNANCY, URINE: Preg Test, Ur: NEGATIVE

## 2013-01-12 SURGERY — DILATATION & CURETTAGE/HYSTEROSCOPY WITH NOVASURE ABLATION
Anesthesia: General | Site: Vagina | Wound class: Clean Contaminated

## 2013-01-12 MED ORDER — KETOROLAC TROMETHAMINE 30 MG/ML IJ SOLN: 15.0000 mg | Freq: Once | INTRAMUSCULAR | Status: DC | PRN

## 2013-01-12 MED ORDER — DOCUSATE SODIUM 100 MG PO CAPS: 100.0000 mg | ORAL_CAPSULE | Freq: Two times a day (BID) | ORAL | Status: DC | PRN

## 2013-01-12 MED ORDER — ONDANSETRON HCL 4 MG/2ML IJ SOLN
INTRAMUSCULAR | Status: AC
  Filled 2013-01-12: qty 2

## 2013-01-12 MED ORDER — MIDAZOLAM HCL 2 MG/2ML IJ SOLN
INTRAMUSCULAR | Status: AC
  Filled 2013-01-12: qty 2

## 2013-01-12 MED ORDER — DEXAMETHASONE SODIUM PHOSPHATE 10 MG/ML IJ SOLN
INTRAMUSCULAR | Status: AC
  Filled 2013-01-12: qty 1

## 2013-01-12 MED ORDER — PROMETHAZINE HCL 25 MG/ML IJ SOLN: 6.2500 mg | INTRAMUSCULAR | Status: DC | PRN

## 2013-01-12 MED ORDER — MEPERIDINE HCL 25 MG/ML IJ SOLN: 6.2500 mg | INTRAMUSCULAR | Status: DC | PRN

## 2013-01-12 MED ORDER — PROPOFOL 10 MG/ML IV EMUL
INTRAVENOUS | Status: AC
  Filled 2013-01-12: qty 20

## 2013-01-12 MED ORDER — KETOROLAC TROMETHAMINE 30 MG/ML IJ SOLN
INTRAMUSCULAR | Status: DC | PRN
  Administered 2013-01-12: 30 mg via INTRAVENOUS

## 2013-01-12 MED ORDER — FENTANYL CITRATE 0.05 MG/ML IJ SOLN
INTRAMUSCULAR | Status: AC
  Filled 2013-01-12: qty 2

## 2013-01-12 MED ORDER — MIDAZOLAM HCL 2 MG/2ML IJ SOLN
INTRAMUSCULAR | Status: DC | PRN
  Administered 2013-01-12: 2 mg via INTRAVENOUS

## 2013-01-12 MED ORDER — LACTATED RINGERS IV SOLN
INTRAVENOUS | Status: DC
  Administered 2013-01-12: 14:00:00 via INTRAVENOUS

## 2013-01-12 MED ORDER — LIDOCAINE HCL (CARDIAC) 20 MG/ML IV SOLN
INTRAVENOUS | Status: AC
  Filled 2013-01-12: qty 5

## 2013-01-12 MED ORDER — OXYCODONE-ACETAMINOPHEN 5-325 MG PO TABS: 1.0000 | ORAL_TABLET | Freq: Four times a day (QID) | ORAL | Status: DC | PRN

## 2013-01-12 MED ORDER — MIDAZOLAM HCL 2 MG/2ML IJ SOLN: 0.5000 mg | Freq: Once | INTRAMUSCULAR | Status: DC | PRN

## 2013-01-12 MED ORDER — BUPIVACAINE HCL (PF) 0.5 % IJ SOLN
INTRAMUSCULAR | Status: AC
  Filled 2013-01-12: qty 30

## 2013-01-12 MED ORDER — LIDOCAINE HCL (CARDIAC) 20 MG/ML IV SOLN
INTRAVENOUS | Status: DC | PRN
  Administered 2013-01-12: 100 mg via INTRAVENOUS

## 2013-01-12 MED ORDER — PHENYLEPHRINE HCL 10 MG/ML IJ SOLN
INTRAMUSCULAR | Status: DC | PRN
  Administered 2013-01-12: 80 ug via INTRAVENOUS

## 2013-01-12 MED ORDER — FENTANYL CITRATE 0.05 MG/ML IJ SOLN
25.0000 ug | INTRAMUSCULAR | Status: DC | PRN
  Administered 2013-01-12: 50 ug via INTRAVENOUS

## 2013-01-12 MED ORDER — DEXAMETHASONE SODIUM PHOSPHATE 10 MG/ML IJ SOLN
INTRAMUSCULAR | Status: DC | PRN
  Administered 2013-01-12: 10 mg via INTRAVENOUS

## 2013-01-12 MED ORDER — LACTATED RINGERS IR SOLN
Status: DC | PRN
  Administered 2013-01-12: 1

## 2013-01-12 MED ORDER — FENTANYL CITRATE 0.05 MG/ML IJ SOLN
INTRAMUSCULAR | Status: AC
  Filled 2013-01-12: qty 5

## 2013-01-12 MED ORDER — LACTATED RINGERS IV SOLN: INTRAVENOUS | Status: DC

## 2013-01-12 MED ORDER — NAPROXEN SODIUM 550 MG PO TABS: 550.0000 mg | ORAL_TABLET | Freq: Two times a day (BID) | ORAL | Status: DC

## 2013-01-12 MED ORDER — BUPIVACAINE HCL 0.5 % IJ SOLN
INTRAMUSCULAR | Status: DC | PRN
  Administered 2013-01-12: 30 mL

## 2013-01-12 MED ORDER — FENTANYL CITRATE 0.05 MG/ML IJ SOLN
INTRAMUSCULAR | Status: DC | PRN
  Administered 2013-01-12: 100 ug via INTRAVENOUS

## 2013-01-12 MED ORDER — FENTANYL CITRATE 0.05 MG/ML IJ SOLN
INTRAMUSCULAR | Status: AC
  Administered 2013-01-12: 50 ug via INTRAVENOUS
  Filled 2013-01-12: qty 2

## 2013-01-12 MED ORDER — ONDANSETRON HCL 4 MG/2ML IJ SOLN
INTRAMUSCULAR | Status: DC | PRN
  Administered 2013-01-12: 4 mg via INTRAVENOUS

## 2013-01-12 MED ORDER — KETOROLAC TROMETHAMINE 30 MG/ML IJ SOLN
INTRAMUSCULAR | Status: AC
  Filled 2013-01-12: qty 1

## 2013-01-12 SURGICAL SUPPLY — 13 items
ABLATOR ENDOMETRIAL BIPOLAR (ABLATOR) ×2 IMPLANT
CATH ROBINSON RED A/P 16FR (CATHETERS) ×2 IMPLANT
CLOTH BEACON ORANGE TIMEOUT ST (SAFETY) ×2 IMPLANT
CONTAINER PREFILL 10% NBF 60ML (FORM) IMPLANT
DRESSING TELFA 8X3 (GAUZE/BANDAGES/DRESSINGS) ×2 IMPLANT
GLOVE BIO SURGEON STRL SZ7 (GLOVE) ×2 IMPLANT
GLOVE BIOGEL PI IND STRL 7.0 (GLOVE) ×1 IMPLANT
GLOVE BIOGEL PI INDICATOR 7.0 (GLOVE) ×1
GOWN STRL REIN XL XLG (GOWN DISPOSABLE) ×6 IMPLANT
PACK HYSTEROSCOPY LF (CUSTOM PROCEDURE TRAY) ×2 IMPLANT
PAD OB MATERNITY 4.3X12.25 (PERSONAL CARE ITEMS) ×2 IMPLANT
TOWEL OR 17X24 6PK STRL BLUE (TOWEL DISPOSABLE) ×4 IMPLANT
WATER STERILE IRR 1000ML POUR (IV SOLUTION) ×2 IMPLANT

## 2013-01-12 NOTE — Transfer of Care (Signed)
Immediate Anesthesia Transfer of Care Note  Patient: Carmen Burns  Procedure(s) Performed: Procedure(s): DILATATION & CURETTAGE/HYSTEROSCOPY WITH NOVASURE ABLATION (N/A)  Patient Location: PACU  Anesthesia Type:General  Level of Consciousness: sedated  Airway & Oxygen Therapy: Patient Spontanous Breathing and Patient connected to nasal cannula oxygen  Post-op Assessment: Report given to PACU RN and Post -op Vital signs reviewed and stable  Post vital signs: Reviewed and stable  Complications: No apparent anesthesia complications

## 2013-01-12 NOTE — H&P (View-Only) (Signed)
GYNECOLOGY CLINIC PROGRESS NOTE  History:   52 y.o. [id number] here today for preoperative appointment for scheduled Novasure endometrial ablation on 01/12/13.  Patient is Spanish-speaking only, Spanish interpreter present for this encounter.  She denies any current symptoms of anemia.    The following portions of the patient's history were reviewed and updated as appropriate: allergies, current medications, past family history, past medical history, past social history, past surgical history and problem list. Normal pap earlier in the year as per patient.  Objective:   BP 163/95  Pulse 92  Temp(Src) 97.3 F (36.3 C)  Ht 5\' 2"  (1.575 m)  Wt 213 lb 9.6 oz (96.888 kg)  BMI 39.06 kg/m2  LMP 12/26/2012 Physical Exam deferred   Studies 05/01/2012 Pelvic Ultrasound Comparison: 07/09/2010 Findings: Uterus: The uterus is enlarged with a sagittal length of 10.8 cm, depth of 6.7 cm and width of 9.4 cm. The myometrium is diffusely heterogeneous and demonstrates posterior acoustical shadowing throughout. No focal myometrial abnormality is seen. The endometrial myometrial interface is not well delineated and the appearance is compatible with diffuse adenomyosis. Endometrium: Cannot be adequately visualized for accurate measurement. Right ovary: Is not seen with confidence either transabdominally or endovaginally. Left ovary: Is not seen with confidence either transabdominally or endovaginally. Other findings: Unilocular thin-walled simple cyst in the left adnexa measuring 2.3 x 1.4 x 1.3 cm. A similar finding was seen on the prior exam this likely represents a benign paraovarian cyst. No pelvic fluid is noted.  IMPRESSION: Findings compatible with adenomyosis resulting in nonvisualization of the endometrium with this study. Non-visualized ovaries. Thin walled unilocular left adnexal cyst appears stable since the previous exam. Given the appearance and premenopausal status of the patient, no further follow-up for  this benign finding is needed.   05/05/2012 Endometrium, biopsy - BENIGN SECRETORY ENDOMETRIUM, NO ATYPIA, HYPERPLASIA OR MALIGNANCY   Assessment & Plan:    The risks of Novasure endometrial ablation were discussed in detail with the patient including but not limited to: bleeding which may require transfusion; infection which may require antibiotics; injury to uterus leading to risk of injury to surrounding intraperitoneal organs, need for additional procedures including laparoscopy or laparotomy, and other postoperative/anesthesia complications. Patient was told that the likelihood that her condition and symptoms will be treated effectively with this surgical management was very high; the postoperative expectations were also discussed in detail.  Routine preoperative instructions of having nothing to eat or drink after midnight on the day prior to surgery and also coming to the hospital 1.5 hours prior to her time of surgery were also emphasized.  All questions were answered. Printed patient education handouts about the procedure were given to the patient to review at home.

## 2013-01-12 NOTE — Anesthesia Postprocedure Evaluation (Signed)
  Anesthesia Post Note  Patient: Carmen Burns  Procedure(s) Performed: Procedure(s) (LRB): DILATATION & CURETTAGE/HYSTEROSCOPY WITH NOVASURE ABLATION (N/A)  Anesthesia type: GA  Patient location: PACU  Post pain: Pain level controlled  Post assessment: Post-op Vital signs reviewed  Last Vitals:  Filed Vitals:   01/12/13 1615  BP:   Pulse: 84  Temp:   Resp: 16    Post vital signs: Reviewed  Level of consciousness: sedated  Complications: No apparent anesthesia complications. Stable occasional pvcs noted (trigeminy at some points).  Discussed with OB provider and she will have patient follow up as outpatient.

## 2013-01-12 NOTE — Anesthesia Preprocedure Evaluation (Signed)
Anesthesia Evaluation  Patient identified by MRN, date of birth, ID band Patient awake    Reviewed: Allergy & Precautions, H&P , Patient's Chart, lab work & pertinent test results, reviewed documented beta blocker date and time   History of Anesthesia Complications Negative for: history of anesthetic complications  Airway Mallampati: II TM Distance: >3 FB Neck ROM: full    Dental no notable dental hx.    Pulmonary neg pulmonary ROS,  breath sounds clear to auscultation  Pulmonary exam normal       Cardiovascular Exercise Tolerance: Good hypertension, negative cardio ROS  Rhythm:regular Rate:Normal     Neuro/Psych negative neurological ROS  negative psych ROS   GI/Hepatic negative GI ROS, Neg liver ROS,   Endo/Other  negative endocrine ROSdiabetes  Renal/GU negative Renal ROS     Musculoskeletal   Abdominal   Peds  Hematology negative hematology ROS (+) anemia ,   Anesthesia Other Findings   Reproductive/Obstetrics negative OB ROS                           Anesthesia Physical Anesthesia Plan  ASA: III  Anesthesia Plan: General LMA   Post-op Pain Management:    Induction:   Airway Management Planned:   Additional Equipment:   Intra-op Plan:   Post-operative Plan:   Informed Consent: I have reviewed the patients History and Physical, chart, labs and discussed the procedure including the risks, benefits and alternatives for the proposed anesthesia with the patient or authorized representative who has indicated his/her understanding and acceptance.   Dental Advisory Given  Plan Discussed with: CRNA, Surgeon and Anesthesiologist  Anesthesia Plan Comments:         Anesthesia Quick Evaluation

## 2013-01-12 NOTE — Anesthesia Procedure Notes (Signed)
Procedure Name: LMA Insertion Date/Time: 01/12/2013 2:11 PM Performed by: Cordale Manera, Jannet Askew Pre-anesthesia Checklist: Patient identified, Patient being monitored, Emergency Drugs available, Timeout performed and Suction available Patient Re-evaluated:Patient Re-evaluated prior to inductionOxygen Delivery Method: Circle system utilized Preoxygenation: Pre-oxygenation with 100% oxygen Intubation Type: IV induction LMA: LMA inserted LMA Size: 4.0 Number of attempts: 1 Dental Injury: Teeth and Oropharynx as per pre-operative assessment

## 2013-01-12 NOTE — Op Note (Signed)
PREOPERATIVE DIAGNOSIS:  Abnormal uterine bleeding POSTOPERATIVE DIAGNOSIS: The same PROCEDURE:  Hysteroscopy,  Dilation and Curettage, NovaSure Endometrial Ablation SURGEON:  Dr. Jaynie Collins  INDICATIONS: 52 y.o. [id number]  here for NovaSure Endometrial Ablation.  Risks of surgery were discussed with the patient including but not limited to: bleeding which may require transfusion; infection which may require antibiotics; injury to uterus leading to risk of injury to surrounding intraperitoneal organs, need for additional procedures including laparoscopy or laparotomy, and other postoperative/anesthesia complications. Written informed consent was obtained.   FINDINGS:  A 8 week size uterus.  Diffuse proliferative endometrium.  Normal ostia bilaterally.  ANESTHESIA:   General and paracervical block with 30 ml of 0.5% Marcaine INTRAVENOUS FLUIDS:  1000 ml of LR ESTIMATED BLOOD LOSS:  10 ml SPECIMENS: Endometrial curettings sent to pathology COMPLICATIONS:  None immediate  PROCEDURE DETAILS:  The patient was taken to the operating room where general anesthesia was administered and was found to be adequate.  After an adequate timeout was performed, she was placed in the dorsal lithotomy position and examined; then prepped and draped in the sterile manner.   Her bladder was catheterized for an unmeasured amount of clear, yellow urine. A speculum was then placed in the patient's vagina and a single tooth tenaculum was applied to the anterior lip of the cervix.  A paracervical block using 0.5% Marcaine was administered. The sound was used to obtain the cervical and uterine cavity length measurements at 3.5 cm and 4 cm respectively; total sounding length 7.5 cm.  The cervix was dilated manually with Hegar dilators to accommodate the diagnostic hysteroscope.  Once the cervix was dilated, the hysteroscope was inserted under direct visualization.   The hysteroscope was also used to determine the level of the  internal os, and measurements were confirmed. The uterine cavity was carefully examined, both ostia were recognized, and diffusely proliferative endometrium with polypoid fragments was noted. The hysteroscope was removed and a curettage was done to obtain some endometrial curettings.  The cervix was further dilated to accommodate the NovaSure device.  The NovaSure device was inserted, and a cavity width of 4.7 cm was determined. Using a power of 103 watts, for 120 sec, the endometrial ablation was performed. The hysteroscope was then re-introduced into the uterine cavity, confirming complete ablation of the endometrium. The tenaculum was removed from the anterior lip of the cervix, and the vaginal speculum was removed after noting good hemostasis.  The patient tolerated the procedure well and was taken to the recovery area awake, extubated and in stable condition.  The patient will be discharged to home as per PACU criteria.  Routine postoperative instructions given.  She was prescribed Percocet, Ibuprofen and Colace.  She will follow up in the clinic on 02/11/13 for postoperative evaluation.  Jaynie Collins, MD, FACOG Attending Obstetrician & Gynecologist Faculty Practice, [facility] [place]

## 2013-01-12 NOTE — Interval H&P Note (Signed)
History and Physical Interval Note  Carmen Burns  has presented today for surgery, with the diagnosis of Abnormal Uterine Bleeding.  Patient is Spanish-speaking only, Spanish interpreter present for this encounter.  The various methods of treatment have been discussed with the patient and family. After consideration of risks, benefits and other options for treatment, the patient has consented to DILATATION & CURETTAGE/HYSTEROSCOPY WITH NOVASURE ABLATION (N/A) as a surgical intervention.  The patient's history has been reviewed, patient examined, no change in status, stable for surgery.  I have reviewed the patient's chart and labs.  Questions were answered to the patient's satisfaction. To OR when ready.    Tereso Newcomer, MD 01/12/2013 1:48 PM

## 2013-01-13 ENCOUNTER — Encounter (HOSPITAL_COMMUNITY): Payer: Self-pay | Admitting: Obstetrics & Gynecology

## 2013-02-11 ENCOUNTER — Encounter: Payer: Self-pay | Admitting: Obstetrics & Gynecology

## 2013-02-11 ENCOUNTER — Ambulatory Visit (INDEPENDENT_AMBULATORY_CARE_PROVIDER_SITE_OTHER): Payer: Self-pay | Admitting: Obstetrics & Gynecology

## 2013-02-11 VITALS — BP 123/76 | HR 100 | Temp 96.5°F | Ht 62.0 in | Wt 209.4 lb

## 2013-02-11 DIAGNOSIS — Z09 Encounter for follow-up examination after completed treatment for conditions other than malignant neoplasm: Secondary | ICD-10-CM

## 2013-02-11 NOTE — Patient Instructions (Addendum)
Regrese a la clinica cuando tenga su cita. Si tiene problemas o preguntas, llama a la clinica o vaya a la sala de emergencia al [facility].    

## 2013-02-11 NOTE — Progress Notes (Signed)
  Subjective:     Carmen Burns is a 52 y.o. [phone] female who presents to the clinic status post Hysteroscopy, Dilation and Curettage, NovaSure Endometrial Ablation for Abnormal Uterine Bleeding on 01/12/13.  Patient is Spanish-speaking only, Spanish interpreter present for this encounter.   Eating a regular diet without difficulty. Bowel movements are normal. The patient is not having any pain.  She is still having some pink discharge, she was told this was expected after an ablation.  The following portions of the patient's history were reviewed and updated as appropriate: allergies, current medications, past family history, past medical history, past social history, past surgical history and problem list.  Review of Systems Pertinent items are noted in HPI.    Objective:    BP 123/76  Pulse 100  Temp(Src) 96.5 F (35.8 C) (Oral)  Ht 5\' 2"  (1.575 m)  Wt 209 lb 6.4 oz (94.983 kg)  BMI 38.29 kg/m2 General:  alert and no distress  Abdomen: soft, bowel sounds active, non-tender  Pelvis:   Deferred   10/21/4 Endometrium, curettage - BENIGN PROLIFERATIVE ENDOMETRIUM, NO ATYPIA, HYPERPLASIA OR MALIGNANCY. Assessment:    Doing well postoperatively. Operative findings again reviewed. Pathology report discussed.    Plan:   Emphasized that patient will not not know her new bleeding pattern until after 3 months; she may be amenorrheic or have lighter periods.  Bleeding precautions emphasized Routine preventative health maintenance measures emphasized.   Jaynie Collins, MD, FACOG Attending Obstetrician & Gynecologist Faculty Practice, [facility] [place]

## 2013-04-16 ENCOUNTER — Other Ambulatory Visit: Payer: Self-pay

## 2013-04-16 MED ORDER — LISINOPRIL-HYDROCHLOROTHIAZIDE 10-12.5 MG PO TABS: 1.0000 | ORAL_TABLET | Freq: Every day | ORAL | Status: DC

## 2013-05-28 ENCOUNTER — Encounter: Payer: Self-pay | Admitting: Internal Medicine

## 2013-05-28 ENCOUNTER — Ambulatory Visit: Payer: Self-pay | Attending: Internal Medicine | Admitting: Internal Medicine

## 2013-05-28 VITALS — BP 126/80 | HR 76 | Temp 98.1°F | Resp 16

## 2013-05-28 DIAGNOSIS — Z09 Encounter for follow-up examination after completed treatment for conditions other than malignant neoplasm: Secondary | ICD-10-CM | POA: Insufficient documentation

## 2013-05-28 DIAGNOSIS — I10 Essential (primary) hypertension: Secondary | ICD-10-CM | POA: Insufficient documentation

## 2013-05-28 DIAGNOSIS — E785 Hyperlipidemia, unspecified: Secondary | ICD-10-CM | POA: Insufficient documentation

## 2013-05-28 DIAGNOSIS — E119 Type 2 diabetes mellitus without complications: Secondary | ICD-10-CM | POA: Insufficient documentation

## 2013-05-28 DIAGNOSIS — Z1211 Encounter for screening for malignant neoplasm of colon: Secondary | ICD-10-CM

## 2013-05-28 LAB — GLUCOSE, POCT (MANUAL RESULT ENTRY): POC Glucose: 346 mg/dl — AB (ref 70–99)

## 2013-05-28 LAB — POCT GLYCOSYLATED HEMOGLOBIN (HGB A1C): Hemoglobin A1C: 9.6

## 2013-05-28 MED ORDER — INSULIN ASPART 100 UNIT/ML ~~LOC~~ SOLN
10.0000 [IU] | Freq: Once | SUBCUTANEOUS | Status: AC
  Administered 2013-05-28: 10 [IU] via SUBCUTANEOUS

## 2013-05-28 MED ORDER — METFORMIN HCL 500 MG PO TABS: 1000.0000 mg | ORAL_TABLET | Freq: Two times a day (BID) | ORAL | Status: DC

## 2013-05-28 MED ORDER — FREESTYLE SYSTEM KIT: 1.0000 | PACK | Freq: Three times a day (TID) | Status: DC

## 2013-05-28 NOTE — Progress Notes (Signed)
Patient here for follow up on her DM And hypertension

## 2013-05-28 NOTE — Progress Notes (Signed)
MRN: [id number] Name: Carmen Burns  Sex: female Age: 53 y.o. DOB: April 07, 1960  Allergies: Penicillins  Chief Complaint  Patient presents with  . Follow-up    HPI: Patient is 53 y.o. female who has to of diabetes, hypertension comes today for followup, she denies any hypoglycemic symptoms as per patient her fasting blood glucoses usually around 200 mg/dL, today her fingerstick glucose was 346, chief heart regular insulin 10 units, also history of hypertension her blood pressure is well controlled denies any headache dizziness chest and shortness of breath.  Past Medical History  Diagnosis Date  . Hypertension   . Hyperlipidemia   . Diabetes mellitus without complication   . Anemia     Past Surgical History  Procedure Laterality Date  . Appendectomy    . Dilitation & currettage/hystroscopy with novasure ablation N/A 01/12/2013    Procedure: DILATATION & CURETTAGE/HYSTEROSCOPY WITH NOVASURE ABLATION;  Surgeon: Osborne Oman, MD;  Location: [facility];  Service: Gynecology;  Laterality: N/A;      Medication List       This list is accurate as of: 05/28/13 12:26 PM.  Always use your most recent med list.               docusate sodium 100 MG capsule  Commonly known as:  COLACE  Take 1 capsule (100 mg total) by mouth 2 (two) times daily as needed for constipation.     ferrous sulfate 325 (65 FE) MG tablet  Take 325 mg by mouth daily with breakfast.     glucose monitoring kit monitoring kit  1 each by Does not apply route 4 (four) times daily - after meals and at bedtime. 1 month Diabetic Testing Supplies for QAC-QHS accuchecks.     lisinopril-hydrochlorothiazide 10-12.5 MG per tablet  Commonly known as:  PRINZIDE,ZESTORETIC  Take 1 tablet by mouth daily.     metFORMIN 500 MG tablet  Commonly known as:  GLUCOPHAGE  Take 2 tablets (1,000 mg total) by mouth 2 (two) times daily with a meal.     multivitamin with minerals Tabs tablet  Take 1 tablet by mouth daily.       naproxen sodium 550 MG tablet  Commonly known as:  ANAPROX  Take 1 tablet (550 mg total) by mouth 2 (two) times daily with a meal. For pain        Meds ordered this encounter  Medications  . insulin aspart (novoLOG) injection 10 Units    Sig:   . metFORMIN (GLUCOPHAGE) 500 MG tablet    Sig: Take 2 tablets (1,000 mg total) by mouth 2 (two) times daily with a meal.    Dispense:  120 tablet    Refill:  3  . glucose monitoring kit (FREESTYLE) monitoring kit    Sig: 1 each by Does not apply route 4 (four) times daily - after meals and at bedtime. 1 month Diabetic Testing Supplies for QAC-QHS accuchecks.    Dispense:  1 each    Refill:  1    Immunization History  Administered Date(s) Administered  . Influenza Split 05/01/2012, 11/25/2012    Family History  Problem Relation Age of Onset  . Diabetes Brother   . Hypertension Brother     History  Substance Use Topics  . Smoking status: Never Smoker   . Smokeless tobacco: Never Used  . Alcohol Use: No    Review of Systems   As noted in HPI  Filed Vitals:   05/28/13 1156  BP: 126/80  Pulse: 76  Temp: 98.1 F (36.7 C)  Resp: 16    Physical Exam  Physical Exam  Constitutional: No distress.  Eyes: EOM are normal. Pupils are equal, round, and reactive to light.  Cardiovascular: Normal rate and regular rhythm.   Pulmonary/Chest: Breath sounds normal. No respiratory distress. She has no wheezes. She has no rales.    CBC    Component Value Date/Time   WBC 7.9 01/05/2013 1110   WBC 12.2* 04/30/2012 1328   RBC 4.36 01/05/2013 1110   RBC 3.54* 04/30/2012 1709   RBC 3.98* 04/30/2012 1328   HGB 12.3 01/05/2013 1110   HGB 6.9* 04/30/2012 1328   HCT 37.3 01/05/2013 1110   HCT 23.9* 04/30/2012 1328   PLT 311 01/05/2013 1110   MCV 85.6 01/05/2013 1110   MCV 60.0* 04/30/2012 1328   LYMPHSABS 2.9 04/30/2012 1613   MONOABS 0.7 04/30/2012 1613   EOSABS 0.1 04/30/2012 1613   BASOSABS 0.1 04/30/2012 1613    CMP     Component  Value Date/Time   NA 136 01/05/2013 1110   K 4.5 01/05/2013 1110   CL 99 01/05/2013 1110   CO2 26 01/05/2013 1110   GLUCOSE 179* 01/05/2013 1110   BUN 10 01/05/2013 1110   CREATININE 0.64 01/05/2013 1110   CALCIUM 10.4 01/05/2013 1110   PROT 7.9 04/24/2007 1819   ALBUMIN 3.2* 04/24/2007 1819   AST 84* 04/24/2007 1819   ALT 38* 04/24/2007 1819   ALKPHOS 125* 04/24/2007 1819   BILITOT 2.1* 04/24/2007 1819   GFRNONAA >90 01/05/2013 1110   GFRAA >90 01/05/2013 1110    No results found for this basename: chol,  tri,  ldl    No components found with this basename: hga1c    Lab Results  Component Value Date/Time   AST 84* 04/24/2007  6:19 PM    Assessment and Plan  DM (diabetes mellitus)  uncontrolled Results for orders placed in visit on 05/28/13  GLUCOSE, POCT (MANUAL RESULT ENTRY)      Result Value Ref Range   POC Glucose 346 (*) 70 - 99 mg/dl  POCT GLYCOSYLATED HEMOGLOBIN (HGB A1C)      Result Value Ref Range   Hemoglobin A1C 9.6      - Plan: Glucose (CBG), HgB A1c, insulin aspart (novoLOG) injection 10 Units, I have increased the dose of metformin to 1 g twice a day, metFORMIN (GLUCOPHAGE) 500 MG tablet, glucose monitoring kit (FREESTYLE) monitoring kit  HTN (hypertension) Well-controlled continued current medications.  Special screening for malignant neoplasms, colon - Plan: Ambulatory referral to Gastroenterology   Health Maintenance -Colonoscopy: referral done  Return in about 3 months (around 08/28/2013) for diabetes, hypertension.  Lorayne Marek, MD

## 2013-05-28 NOTE — Patient Instructions (Signed)
Gua de planeamiento de la alimentacin para diabticos (Diabetes Meal Planning Guide) La gua de planeamiento de alimentacin para diabticos es una herramienta para ayudarlo a planear sus comidas y colaciones. Es importante para las personas con diabetes controlar sus niveles de Location manager. Elegir los [employer] correctos y las cantidades adecuadas durante el da le ayudar a Media planner. [place] bien puede incluso ayudarlo a mejorar la presin sangunea y Science writer o Theatre manager un peso saludable. CUENTE LOS HIDRATOS DE CARBONO CON FACILIDAD Cuando consume hidratos de carbono, stos se transforman en azcar (glucosa). Esto a su vez Agricultural consultant de Museum/gallery exhibitions officer. El conteo de carbohidratos puede ayudarlo a Chief Technology Officer este nivel para que se sienta mejor. Al planear sus alimentos con el conteo de carbohidratos, podr tener ms flexibilidad en lo que come y Curator con el consumo de alimentos. El conteo de carbohidratos significa simplemente sumar la cantidad total de gramos de carbohidratos a sus comidas o colaciones. Trate de consumir la misma cantidad en cada comida. A continuacin encontrar una lista de 1 porcin o 15 gr. de carbohidratos. A continuacin se enumeran. Pregunte al mdico cuntos gramos de carbohidratos necesita comer en cada comida o colacin. Almidones y granos  1 [place] de pan.   bollo ingls o bollo para hamburguesa o hotdog.   taza de cereal fro (sin azcar).   taza de pasta o arroz cocido.   taza de vegetales que contengan almidn (maz, papas, arvejas, porotos, calabaza).  1 omelette (6 pulgadas).   bollo.  1 waffle o panqueque (del tamao de un CD).   taza de cereales cocidos.  4 a 6 galletas saldas pequeas. *Se recomienda el consumo de granos enteros. Frutas  1 taza de frutos rojos, meln, papaya o anan sin azcar.  1 fruta fresca pequea.   banana o mango.   taza de jugo de frutas (4 onzas sin endulzar).    taza de fruta envasada en jugo natural o agua.  2 cucharadas de frutas secas.  12-15 uvas o cerezas. Leche y yogurt  1 taza de [employer] o al 1%.  [place].  6 onzas de yogurt descremado con edulcorante sin azcar.  6 onzas de yogur descremado de soja.  6 onzas de yogur natural. Vegetales  1 taza de vegetales crudos o  de vegetales cocidos se considera cero carbohidratos o una comida "libre".  Si come 3 o ms porciones en una comida, cuntelas como 1 porcin de carbohidratos. Otros carbohidratos   onzas de chips o pretzels.   taza de helado de crema o yogur helado.   taza de helado de agua.  5 cm de torta no congelada.  1 cucharada de miel, azcar, mermelada, jalea o almbar.  2 galletitas dulces pequeas.  3 cuadrados de crackers de graham.  3 tazas de palomitas de maz.  6 crackers.  1 taza de caldo.  Cuente 1 taza de guisado u otra mezcla de alimentos como 2 porciones de carbohidratos.  Los alimentos con menos de 20 caloras por porcin deben contarse como cero carbohidratos o alimento "libre". Si lo desea compre un libro o software de computacin que enumere la cantidad de gramos de carbohidratos de los diferentes alimentos. Adems, el panel nutricional en las etiquetas de los productos que consume es una buena fuente de informacin. Le indicar el tamao de la porcin y la cantidad total de carbohidratos que consumir por cada una. Divida este nmero por 15 para obtener el nmero  de conteo de carbohidratos por porcin. Recuerde: cada porcin son 15 gramos de carbohidratos. PORCIONES La medicin de los alimentos y el tamao de las porciones lo ayudarn a controlar la cantidad exacta de comida que debe ingerir. La lista que sigue le mostrar el tamao de algunas porciones comunes.   1 onza.................4 dados apilados.  3 onzas...............Un mazo de cartas.  1 cucharadita.....La punta de un dedo pequeo.  1  cucharada........Un dedo.  2 cucharadas......Una pelota de golf.   taza...............La mitad de un puo.  1 taza................Un puo. EJEMPLO DE PLAN DE ALIMENTACIN PARA DIABTICOS: A continuacin se muestra un ejemplo de plan de alimentacin que incluye comidas de los grupos de granos y fculas, vegetales, frutas y carnes. Un nutricionista podr confeccionarle un plan individualizado para cubrir sus necesidades calricas y decirle el nmero de porciones que necesita de cada grupo. Sin embargo, podra intercambiar los alimentos que contengan carbohidratos (lcteos, cereales y frutas). Controlar la cantidad total de carbohidratos en los alimentos o colaciones es ms importante que asegurarse de incluir todos los grupos alimenticios cada vez que come.  El siguiente plan de alimentacin es un ejemplo de una dieta de 2000 caloras mediante el conteo de carbohidratos. Este plan contiene 17 porciones de carbohidratos. Desayuno  1 taza de avena (2 porciones de carbohidratos).   taza de yogur light(1 porcin de carbohidratos).  1 taza de arndanos (1 porcin de carbohidratos).   taza de almendras. Colacin  1 manzana grande (2 porciones de carbohidratos).  1 palito de queso bajo en grasa. Almuerzo  Ensalada de pechuga de pollo.  1 taza de espinacas.   taza de tomates cortados.  2 oz (60 gr) de pechuga de pollo en rebanadas.  2 cucharadas de aderezo italiano bajo en contenido graso.  12 galletas integrales (2 porciones de carbohidratos).  12 a 15 uvas (1 porcin de carbohidratos).  1 taza de leche descremada (1porcin de carbohidratos). Colacin  1 taza de zanahorias.   taza de pur de garbanzos (1 porcin de carbohidratos). Cena  3 oz (80 gr) de salmn a la parrilla.  1 taza de arroz integral (3 porciones de carbohidratos). Colacin  1  taza de brcoli al vapor (1 porcin de carbohidrato) con una cucharadita de aceite de oliva y jugo de limn.  1 taza de  budn light (2 porciones de carbohidratos). HOJA DE PLANEAMIENTO DE LA ALIMENTACIN: El dietista podr utilizar esta hoja para ayudarlo a decidir cuntas porciones y qu tipos de alimentos son los adecuados para usted.  DESAYUNO Grupo de alimentos y porciones / Alimento elegido Granos/Fculas_________________________________________________ Lcteos________________________________________________________ Vegetales ______________________________________________________ Fruta __________________________________________________________ Carnes _________________________________________________________ Grasas _________________________________________________________ ALMUERZO Grupo de alimentos y porciones / Alimento elegido Granos/Fculas___________________________________________________ Lcteos_________________________________________________________ Fruta ___________________________________________________________ Carnes __________________________________________________________ Grasas __________________________________________________________ CENA Grupo de alimentos y porciones / Alimento elegido Granos/Fculas___________________________________________________ Lcteos_________________________________________________________ Fruta ___________________________________________________________ Carnes __________________________________________________________ Grasas __________________________________________________________ COLACIN Grupo de alimentos y porciones / Alimento elegido Granos/Fculas_________________________________________________ Lcteos________________________________________________________ Vegetales ______________________________________________________ Fruta _________________________________________________________ Carnes ________________________________________________________ Grasas ________________________________________________________ TOTALES  DIARIOS Fculas_______________________________________________________ Vegetales _____________________________________________________ Fruta ________________________________________________________ Lcteos_______________________________________________________ Carnes________________________________________________________ Grasas ________________________________________________________ Document Released: 06/18/2007 Document Revised: 06/03/2011 ExitCare Patient Information 2014 [employer].  

## 2013-06-11 ENCOUNTER — Ambulatory Visit: Payer: Self-pay

## 2013-07-05 ENCOUNTER — Encounter: Payer: Self-pay | Admitting: Internal Medicine

## 2013-08-27 ENCOUNTER — Encounter: Payer: Self-pay | Admitting: Internal Medicine

## 2013-08-27 ENCOUNTER — Ambulatory Visit: Payer: Self-pay | Attending: Internal Medicine | Admitting: Internal Medicine

## 2013-08-27 VITALS — BP 122/89 | HR 92 | Temp 99.6°F | Resp 16 | Ht 64.0 in | Wt 207.0 lb

## 2013-08-27 DIAGNOSIS — I10 Essential (primary) hypertension: Secondary | ICD-10-CM | POA: Insufficient documentation

## 2013-08-27 DIAGNOSIS — E119 Type 2 diabetes mellitus without complications: Secondary | ICD-10-CM | POA: Insufficient documentation

## 2013-08-27 DIAGNOSIS — R300 Dysuria: Secondary | ICD-10-CM | POA: Insufficient documentation

## 2013-08-27 DIAGNOSIS — E1165 Type 2 diabetes mellitus with hyperglycemia: Principal | ICD-10-CM

## 2013-08-27 DIAGNOSIS — IMO0001 Reserved for inherently not codable concepts without codable children: Secondary | ICD-10-CM | POA: Insufficient documentation

## 2013-08-27 DIAGNOSIS — E1110 Type 2 diabetes mellitus with ketoacidosis without coma: Secondary | ICD-10-CM

## 2013-08-27 DIAGNOSIS — Z791 Long term (current) use of non-steroidal anti-inflammatories (NSAID): Secondary | ICD-10-CM | POA: Insufficient documentation

## 2013-08-27 DIAGNOSIS — E1310 Other specified diabetes mellitus with ketoacidosis without coma: Secondary | ICD-10-CM

## 2013-08-27 LAB — GLUCOSE, POCT (MANUAL RESULT ENTRY): POC Glucose: 215 mg/dl — AB (ref 70–99)

## 2013-08-27 LAB — POCT URINALYSIS DIPSTICK
Bilirubin, UA: NEGATIVE
Blood, UA: NEGATIVE
Glucose, UA: 500
KETONES UA: NEGATIVE
LEUKOCYTES UA: NEGATIVE
Nitrite, UA: NEGATIVE
PROTEIN UA: NEGATIVE
Spec Grav, UA: 1.015
Urobilinogen, UA: 0.2
pH, UA: 5.5

## 2013-08-27 LAB — POCT GLYCOSYLATED HEMOGLOBIN (HGB A1C): HEMOGLOBIN A1C: 7.8

## 2013-08-27 MED ORDER — PHENAZOPYRIDINE HCL 95 MG PO TABS: 95.0000 mg | ORAL_TABLET | Freq: Three times a day (TID) | ORAL | Status: DC | PRN

## 2013-08-27 MED ORDER — LISINOPRIL-HYDROCHLOROTHIAZIDE 10-12.5 MG PO TABS: 1.0000 | ORAL_TABLET | Freq: Every day | ORAL | Status: DC

## 2013-08-27 MED ORDER — METFORMIN HCL 500 MG PO TABS: 1000.0000 mg | ORAL_TABLET | Freq: Two times a day (BID) | ORAL | Status: DC

## 2013-08-27 MED ORDER — GLIPIZIDE 5 MG PO TABS: 5.0000 mg | ORAL_TABLET | Freq: Two times a day (BID) | ORAL | Status: DC

## 2013-08-27 NOTE — Progress Notes (Signed)
MRN: [id number] Name: Carmen Burns  Sex: female Age: 53 y.o. DOB: [birth year]  Allergies: Penicillins  Chief Complaint  Patient presents with  . Follow-up  . Hypertension  . Diabetes    HPI: Patient is 53 y.o. female who has to of diabetes hypertension comes today for followup, on the last visit I had increased her metformin to 1 g twice a day as per patient she is taking her medications regularly her hemoglobin A1c is trending down, she denies any hypoglycemic symptoms still her fasting sugar is usually more than 1 50 mg/dL, patient reported to have urinary burning sensation for the last one week denies any nausea vomiting fever chills.  Past Medical History  Diagnosis Date  . Hypertension   . Hyperlipidemia   . Diabetes mellitus without complication   . Anemia     Past Surgical History  Procedure Laterality Date  . Appendectomy    . Dilitation & currettage/hystroscopy with novasure ablation N/A 01/12/2013    Procedure: DILATATION & CURETTAGE/HYSTEROSCOPY WITH NOVASURE ABLATION;  Surgeon: Osborne Oman, MD;  Location: [facility];  Service: Gynecology;  Laterality: N/A;      Medication List       This list is accurate as of: 08/27/13 12:03 PM.  Always use your most recent med list.               docusate sodium 100 MG capsule  Commonly known as:  COLACE  Take 1 capsule (100 mg total) by mouth 2 (two) times daily as needed for constipation.     ferrous sulfate 325 (65 FE) MG tablet  Take 325 mg by mouth daily with breakfast.     glipiZIDE 5 MG tablet  Commonly known as:  GLUCOTROL  Take 1 tablet (5 mg total) by mouth 2 (two) times daily before a meal.     glucose monitoring kit monitoring kit  1 each by Does not apply route 4 (four) times daily - after meals and at bedtime. 1 month Diabetic Testing Supplies for QAC-QHS accuchecks.     lisinopril-hydrochlorothiazide 10-12.5 MG per tablet  Commonly known as:  PRINZIDE,ZESTORETIC  Take 1 tablet by mouth  daily.     metFORMIN 500 MG tablet  Commonly known as:  GLUCOPHAGE  Take 2 tablets (1,000 mg total) by mouth 2 (two) times daily with a meal.     multivitamin with minerals Tabs tablet  Take 1 tablet by mouth daily.     naproxen sodium 550 MG tablet  Commonly known as:  ANAPROX  Take 1 tablet (550 mg total) by mouth 2 (two) times daily with a meal. For pain     phenazopyridine 95 MG tablet  Commonly known as:  PYRIDIUM  Take 1 tablet (95 mg total) by mouth 3 (three) times daily as needed for pain.        Meds ordered this encounter  Medications  . metFORMIN (GLUCOPHAGE) 500 MG tablet    Sig: Take 2 tablets (1,000 mg total) by mouth 2 (two) times daily with a meal.    Dispense:  120 tablet    Refill:  3  . lisinopril-hydrochlorothiazide (PRINZIDE,ZESTORETIC) 10-12.5 MG per tablet    Sig: Take 1 tablet by mouth daily.    Dispense:  30 tablet    Refill:  3  . glipiZIDE (GLUCOTROL) 5 MG tablet    Sig: Take 1 tablet (5 mg total) by mouth 2 (two) times daily before a meal.    Dispense:  60 tablet  Refill:  3  . phenazopyridine (PYRIDIUM) 95 MG tablet    Sig: Take 1 tablet (95 mg total) by mouth 3 (three) times daily as needed for pain.    Dispense:  10 tablet    Refill:  0    Immunization History  Administered Date(s) Administered  . Influenza Split 05/01/2012, 11/25/2012    Family History  Problem Relation Age of Onset  . Diabetes Brother   . Hypertension Brother     History  Substance Use Topics  . Smoking status: Never Smoker   . Smokeless tobacco: Never Used  . Alcohol Use: No    Review of Systems   As noted in HPI  Filed Vitals:   08/27/13 1136  BP: 122/89  Pulse: 92  Temp: 99.6 F (37.6 C)  Resp: 16    Physical Exam  Physical Exam  Constitutional: No distress.  Eyes: EOM are normal. Pupils are equal, round, and reactive to light.  Cardiovascular: Normal rate and regular rhythm.   Pulmonary/Chest: Breath sounds normal. No respiratory  distress. She has no wheezes. She has no rales.  Abdominal: There is no rebound and no guarding.  Minimal suprapubic discomfort, no CVA tenderness   Musculoskeletal: She exhibits no edema.    CBC    Component Value Date/Time   WBC 7.9 01/05/2013 1110   WBC 12.2* 04/30/2012 1328   RBC 4.36 01/05/2013 1110   RBC 3.54* 04/30/2012 1709   RBC 3.98* 04/30/2012 1328   HGB 12.3 01/05/2013 1110   HGB 6.9* 04/30/2012 1328   HCT 37.3 01/05/2013 1110   HCT 23.9* 04/30/2012 1328   PLT 311 01/05/2013 1110   MCV 85.6 01/05/2013 1110   MCV 60.0* 04/30/2012 1328   LYMPHSABS 2.9 04/30/2012 1613   MONOABS 0.7 04/30/2012 1613   EOSABS 0.1 04/30/2012 1613   BASOSABS 0.1 04/30/2012 1613    CMP     Component Value Date/Time   NA 136 01/05/2013 1110   K 4.5 01/05/2013 1110   CL 99 01/05/2013 1110   CO2 26 01/05/2013 1110   GLUCOSE 179* 01/05/2013 1110   BUN 10 01/05/2013 1110   CREATININE 0.64 01/05/2013 1110   CALCIUM 10.4 01/05/2013 1110   PROT 7.9 04/24/2007 1819   ALBUMIN 3.2* 04/24/2007 1819   AST 84* 04/24/2007 1819   ALT 38* 04/24/2007 1819   ALKPHOS 125* 04/24/2007 1819   BILITOT 2.1* 04/24/2007 1819   GFRNONAA >90 01/05/2013 1110   GFRAA >90 01/05/2013 1110    No results found for this basename: chol, tri, ldl    No components found with this basename: hga1c    Lab Results  Component Value Date/Time   AST 84* 04/24/2007  6:19 PM    Assessment and Plan  DM (diabetes mellitus) type 2, uncontrolled,  - Plan:  Results for orders placed in visit on 08/27/13  POCT GLYCOSYLATED HEMOGLOBIN (HGB A1C)      Result Value Ref Range   Hemoglobin A1C 7.8    GLUCOSE, POCT (MANUAL RESULT ENTRY)      Result Value Ref Range   POC Glucose 215 (*) 70 - 99 mg/dl  POCT URINALYSIS DIPSTICK      Result Value Ref Range   Color, UA yellow     Clarity, UA clear     Glucose, UA 500     Bilirubin, UA neg     Ketones, UA neg     Spec Grav, UA 1.015     Blood, UA neg  pH, UA 5.5     Protein, UA neg      Urobilinogen, UA 0.2     Nitrite, UA neg     Leukocytes, UA Negative     HgB A1c is trending down  DM (diabetes mellitus) - Plan: I have advised patient for diabetes meal planning, continue with her metformin 1 g twice a day, I have added Glucotrol 5 mg twice a day, advise patient to keep the fingerstick log  metFORMIN (GLUCOPHAGE) 500 MG tablet, glipiZIDE (GLUCOTROL) 5 MG tablet  Dysuria - Plan: Urine is negative for infection, symptomatic treatment with phenazopyridine (PYRIDIUM) 95 MG tablet  HTN (hypertension) - Plan: Blood pressure is well controlled continue with her lisinopril-hydrochlorothiazide (PRINZIDE,ZESTORETIC) 10-12.5 MG per tablet    Return in about 3 months (around 11/27/2013) for diabetes, hypertension.  Lorayne Marek, MD

## 2013-08-27 NOTE — Patient Instructions (Signed)
Gua de planeamiento de la alimentacin para diabticos (Diabetes Meal Planning Guide) La gua de planeamiento de alimentacin para diabticos es una herramienta para ayudarlo a planear sus comidas y colaciones. Es importante para las personas con diabetes controlar sus niveles de Location manager. Elegir los [employer] correctos y las cantidades adecuadas durante el da le ayudar a Media planner. Com[place] bien puede incluso ayudarlo a mejorar la presin sangunea y Science writer o Theatre manager un peso saludable. CUENTE LOS HIDRATOS DE CARBONO CON FACILIDAD Cuando consume hidratos de carbono, stos se transforman en azcar (glucosa). Esto a su vez Agricultural consultant de Museum/gallery exhibitions officer. El conteo de carbohidratos puede ayudarlo a Chief Technology Officer este nivel para que se sienta mejor. Al planear sus alimentos con el conteo de carbohidratos, podr tener ms flexibilidad en lo que come y Curator con el consumo de alimentos. El conteo de carbohidratos significa simplemente sumar la cantidad total de gramos de carbohidratos a sus comidas o colaciones. Trate de consumir la misma cantidad en cada comida. A continuacin encontrar una lista de 1 porcin o 15 gr. de carbohidratos. A continuacin se enumeran. Pregunte al mdico cuntos gramos de carbohidratos necesita comer en cada comida o colacin. Almidones y granos  1 [place] de pan.   bollo ingls o bollo para hamburguesa o hotdog.   taza de cereal fro (sin azcar).   taza de pasta o arroz cocido.   taza de vegetales que contengan almidn (maz, papas, arvejas, porotos, calabaza).  1 omelette (6 pulgadas).   bollo.  1 waffle o panqueque (del tamao de un CD).   taza de cereales cocidos.  4 a 6 galletas saldas pequeas. *Se recomienda el consumo de granos enteros. Frutas  1 taza de frutos rojos, meln, papaya o anan sin azcar.  1 fruta fresca pequea.   banana o mango.   taza de jugo de frutas (4 onzas sin endulzar).    taza de fruta envasada en jugo natural o agua.  2 cucharadas de frutas secas.  12-15 uvas o cerezas. Leche y yogurt  1 taza de [employer] o al 1%.  [place].  6 onzas de yogurt descremado con edulcorante sin azcar.  6 onzas de yogur descremado de soja.  6 onzas de yogur natural. Vegetales  1 taza de vegetales crudos o  de vegetales cocidos se considera cero carbohidratos o una comida "libre".  Si come 3 o ms porciones en una comida, cuntelas como 1 porcin de carbohidratos. Otros carbohidratos   onzas de chips o pretzels.   taza de helado de crema o yogur helado.   taza de helado de agua.  5 cm de torta no congelada.  1 cucharada de miel, azcar, mermelada, jalea o almbar.  2 galletitas dulces pequeas.  3 cuadrados de crackers de graham.  3 tazas de palomitas de maz.  6 crackers.  1 taza de caldo.  Cuente 1 taza de guisado u otra mezcla de alimentos como 2 porciones de carbohidratos.  Los alimentos con menos de 20 caloras por porcin deben contarse como cero carbohidratos o alimento "libre". Si lo desea compre un libro o software de computacin que enumere la cantidad de gramos de carbohidratos de los diferentes alimentos. Adems, el panel nutricional en las etiquetas de los productos que consume es una buena fuente de informacin. Le indicar el tamao de la porcin y la cantidad total de carbohidratos que consumir por cada una. Divida este nmero por 15 para obtener el nmero  de conteo de carbohidratos por porcin. Recuerde: cada porcin son 47 gramos de carbohidratos. PORCIONES La medicin de los alimentos y el tamao de las porciones lo ayudarn a Aeronautical engineer cantidad exacta de comida que debe ingerir. La lista que sigue le mostrar el tamao de algunas porciones comunes.   1 onza.................4 dados apilados.  3 onzas..............Marland KitchenUn mazo de cartas.  1 cucharadita...Marland KitchenMarland KitchenLa punta de un dedo pequeo.  1  cucharada.......Marland KitchenUn dedo.  2 cucharadas....Marland KitchenMarland KitchenUna pelota de golf.   taza..............Marland KitchenLa mitad de un puo.  1 taza...............Marland KitchenUn puo. EJEMPLO DE PLAN DE ALIMENTACIN PARA DIABTICOS: A continuacin se muestra un ejemplo de plan de alimentacin que incluye comidas de los grupos de granos y [place], Photographer, frutas y carnes. Un nutricionista podr confeccionarle un plan individualizado para cubrir sus necesidades calricas y decirle el nmero de porciones que necesita de [place]. Sin embargo, podra [employer] alimentos que contengan carbohidratos (lcteos, cereales y frutas). Controlar la cantidad total de carbohidratos en los alimentos o colaciones es ms importante que asegurarse de incluir todos los grupos alimenticios cada vez que come.  El siguiente plan de alimentacin es un ejemplo de una dieta de 2000 caloras mediante el conteo de carbohidratos. Este plan contiene 17 porciones de carbohidratos. Desayuno  1 taza de avena (2 porciones de carbohidratos).   taza de yogur light(1 porcin de carbohidratos).  1 taza de arndanos (1 porcin de carbohidratos).   taza de almendras. Colacin  1 manzana grande (2 porciones de carbohidratos).  1 palito de queso bajo [employer]. Almuerzo  Ensalada de pechuga de pollo.  1 taza de espinacas.   taza de tomates cortados.  2 oz (60 gr) de pechuga de pollo en rebanadas.  2 cucharadas de aderezo italiano bajo en [employer].  12 galletas integrales (2 porciones de carbohidratos).  12 a 15 uvas (1 porcin de carbohidratos).  [place] (1porcin de carbohidratos). Colacin  1 taza de zanahorias.   taza de pur de garbanzos (1 porcin de carbohidratos). Cena  3 oz (80 gr) de salmn a la parrilla.  1 taza de arroz integral (3 porciones de carbohidratos). Colacin  1  taza de brcoli al vapor (1 porcin de carbohidrato) con una cucharadita de aceite de oliva y jugo de limn.  1 taza de  budn light (2 porciones de carbohidratos). HOJA DE PLANEAMIENTO DE LA ALIMENTACIN: El dietista podr utilizar esta hoja para ayudarlo a decidir cuntas porciones y qu tipos de alimentos son los adecuados para usted.  DESAYUNO Grupo de alimentos y porciones / Alimento elegido Granos/Fculas_________________________________________________ Lcteos________________________________________________________ Charlann Lange ______________________________________________________ Donalee Citrin __________________________________________________________ Gordy Clement _________________________________________________________ Daphene Jaeger _________________________________________________________ Renato Gails de alimentos y porciones / Alimento elegido Granos/Fculas___________________________________________________ Lcteos_________________________________________________________ Donalee Citrin ___________________________________________________________ Gordy Clement __________________________________________________________ Daphene Jaeger __________________________________________________________ [place] Nickel de alimentos y porciones / Alimento elegido Granos/Fculas___________________________________________________ Lcteos_________________________________________________________ Donalee Citrin ___________________________________________________________ Gordy Clement __________________________________________________________ Daphene Jaeger __________________________________________________________ [place] de alimentos y porciones / Alimento elegido Granos/Fculas_________________________________________________ Lcteos________________________________________________________ Charlann Lange ______________________________________________________ Donalee Citrin _________________________________________________________ Gordy Clement ________________________________________________________ Daphene Jaeger ________________________________________________________ Elsworth Soho  DIARIOS Fculas_______________________________________________________ [id number] _____________________________________________________ Donalee Citrin ________________________________________________________ Lcteos_______________________________________________________ Carnes________________________________________________________ Daphene Jaeger ________________________________________________________ Document Released: 06/18/2007 Document Revised: 06/03/2011 Exit[employer] Patient Information 2014 [employer].

## 2013-08-27 NOTE — Progress Notes (Signed)
Pt comes in for 3 mnth f/u visit Diabetes,HTN and medical management Pt is taking prescribed medications daily Need refills and sent to [facility] burning with urination with lower abdominal pressure x 1 week Denies freq and back pain,denies fever Spanish interpretor present

## 2013-09-21 ENCOUNTER — Other Ambulatory Visit: Payer: Self-pay | Admitting: Internal Medicine

## 2013-09-21 DIAGNOSIS — Z1231 Encounter for screening mammogram for malignant neoplasm of breast: Secondary | ICD-10-CM

## 2013-10-07 ENCOUNTER — Ambulatory Visit (HOSPITAL_COMMUNITY)
Admission: RE | Admit: 2013-10-07 | Discharge: 2013-10-07 | Disposition: A | Discharge status: Home / Self Care | Payer: Self-pay | Source: Ambulatory Visit | Attending: Internal Medicine | Admitting: Internal Medicine

## 2013-10-07 DIAGNOSIS — Z1231 Encounter for screening mammogram for malignant neoplasm of breast: Secondary | ICD-10-CM

## 2013-11-26 ENCOUNTER — Encounter: Payer: Self-pay | Admitting: Internal Medicine

## 2013-11-26 ENCOUNTER — Ambulatory Visit: Payer: Self-pay | Attending: Internal Medicine | Admitting: Internal Medicine

## 2013-11-26 VITALS — BP 158/87 | HR 78 | Temp 98.3°F | Resp 16 | Ht 65.0 in | Wt 216.0 lb

## 2013-11-26 DIAGNOSIS — D649 Anemia, unspecified: Secondary | ICD-10-CM | POA: Insufficient documentation

## 2013-11-26 DIAGNOSIS — E785 Hyperlipidemia, unspecified: Secondary | ICD-10-CM | POA: Insufficient documentation

## 2013-11-26 DIAGNOSIS — I10 Essential (primary) hypertension: Secondary | ICD-10-CM | POA: Insufficient documentation

## 2013-11-26 DIAGNOSIS — Z23 Encounter for immunization: Secondary | ICD-10-CM | POA: Insufficient documentation

## 2013-11-26 DIAGNOSIS — E1310 Other specified diabetes mellitus with ketoacidosis without coma: Secondary | ICD-10-CM | POA: Insufficient documentation

## 2013-11-26 DIAGNOSIS — Z1211 Encounter for screening for malignant neoplasm of colon: Secondary | ICD-10-CM

## 2013-11-26 DIAGNOSIS — E1110 Type 2 diabetes mellitus with ketoacidosis without coma: Secondary | ICD-10-CM

## 2013-11-26 LAB — GLUCOSE, POCT (MANUAL RESULT ENTRY): POC GLUCOSE: 130 mg/dL — AB (ref 70–99)

## 2013-11-26 LAB — POCT GLYCOSYLATED HEMOGLOBIN (HGB A1C): Hemoglobin A1C: 7.8

## 2013-11-26 MED ORDER — GLIPIZIDE 10 MG PO TABS: 10.0000 mg | ORAL_TABLET | Freq: Two times a day (BID) | ORAL | Status: DC

## 2013-11-26 NOTE — Progress Notes (Signed)
MRN: [id number] Name: Carmen Burns  Sex: female Age: 53 y.o. DOB: 1960/10/17  Allergies: Penicillins  Chief Complaint  Patient presents with  . Follow-up  . Hypertension  . Diabetes  . Medication Refill    HPI: Patient is 53 y.o. female who has to of diabetes hypertension comes today for followup as per patient she has not taken her blood pressure medication today denies any headache dizziness chest and shortness of breath, and did complain of some ear itching and wants the uterus to be checked, her for diabetes she has been taking metformin as well as Glucotrol 5 mg 2 times a day her A1c in the office today is 7.8% not changed compared to last visit, she denies any hypoglycemic symptoms, as per patient her fasting sugar is usually more than 1:30 milligrams a deciliter. Patient is up to date with mammogram but has not done colonoscopy yet.   Past Medical History  Diagnosis Date  . Hypertension   . Hyperlipidemia   . Diabetes mellitus without complication   . Anemia     Past Surgical History  Procedure Laterality Date  . Appendectomy    . Dilitation & currettage/hystroscopy with novasure ablation N/A 01/12/2013    Procedure: DILATATION & CURETTAGE/HYSTEROSCOPY WITH NOVASURE ABLATION;  Surgeon: Osborne Oman, MD;  Location: [facility];  Service: Gynecology;  Laterality: N/A;      Medication List       This list is accurate as of: 11/26/13 12:16 PM.  Always use your most recent med list.               docusate sodium 100 MG capsule  Commonly known as:  COLACE  Take 1 capsule (100 mg total) by mouth 2 (two) times daily as needed for constipation.     ferrous sulfate 325 (65 FE) MG tablet  Take 325 mg by mouth daily with breakfast.     glipiZIDE 10 MG tablet  Commonly known as:  GLUCOTROL  Take 1 tablet (10 mg total) by mouth 2 (two) times daily before a meal.     glucose monitoring kit monitoring kit  1 each by Does not apply route 4 (four) times daily - after  meals and at bedtime. 1 month Diabetic Testing Supplies for QAC-QHS accuchecks.     lisinopril-hydrochlorothiazide 10-12.5 MG per tablet  Commonly known as:  PRINZIDE,ZESTORETIC  Take 1 tablet by mouth daily.     metFORMIN 500 MG tablet  Commonly known as:  GLUCOPHAGE  Take 2 tablets (1,000 mg total) by mouth 2 (two) times daily with a meal.     multivitamin with minerals Tabs tablet  Take 1 tablet by mouth daily.     naproxen sodium 550 MG tablet  Commonly known as:  ANAPROX  Take 1 tablet (550 mg total) by mouth 2 (two) times daily with a meal. For pain     phenazopyridine 95 MG tablet  Commonly known as:  PYRIDIUM  Take 1 tablet (95 mg total) by mouth 3 (three) times daily as needed for pain.        Meds ordered this encounter  Medications  . glipiZIDE (GLUCOTROL) 10 MG tablet    Sig: Take 1 tablet (10 mg total) by mouth 2 (two) times daily before a meal.    Dispense:  60 tablet    Refill:  3    Immunization History  Administered Date(s) Administered  . Influenza Split 05/01/2012, 11/25/2012    Family History  Problem Relation Age  of Onset  . Diabetes Brother   . Hypertension Brother     History  Substance Use Topics  . Smoking status: Never Smoker   . Smokeless tobacco: Never Used  . Alcohol Use: No    Review of Systems   As noted in HPI  Filed Vitals:   11/26/13 1202  BP: 158/87  Pulse: 78  Temp: 98.3 F (36.8 C)  Resp: 16    Physical Exam  Physical Exam  HENT:  Both TMs visualized not congested  Eyes: EOM are normal. Pupils are equal, round, and reactive to light.  Cardiovascular: Normal rate and regular rhythm.   Pulmonary/Chest: Breath sounds normal. No respiratory distress. She has no wheezes. She has no rales.  Musculoskeletal: She exhibits no edema.    CBC    Component Value Date/Time   WBC 7.9 01/05/2013 1110   WBC 12.2* 04/30/2012 1328   RBC 4.36 01/05/2013 1110   RBC 3.54* 04/30/2012 1709   RBC 3.98* 04/30/2012 1328   HGB 12.3  01/05/2013 1110   HGB 6.9* 04/30/2012 1328   HCT 37.3 01/05/2013 1110   HCT 23.9* 04/30/2012 1328   PLT 311 01/05/2013 1110   MCV 85.6 01/05/2013 1110   MCV 60.0* 04/30/2012 1328   LYMPHSABS 2.9 04/30/2012 1613   MONOABS 0.7 04/30/2012 1613   EOSABS 0.1 04/30/2012 1613   BASOSABS 0.1 04/30/2012 1613    CMP     Component Value Date/Time   NA 136 01/05/2013 1110   K 4.5 01/05/2013 1110   CL 99 01/05/2013 1110   CO2 26 01/05/2013 1110   GLUCOSE 179* 01/05/2013 1110   BUN 10 01/05/2013 1110   CREATININE 0.64 01/05/2013 1110   CALCIUM 10.4 01/05/2013 1110   PROT 7.9 04/24/2007 1819   ALBUMIN 3.2* 04/24/2007 1819   AST 84* 04/24/2007 1819   ALT 38* 04/24/2007 1819   ALKPHOS 125* 04/24/2007 1819   BILITOT 2.1* 04/24/2007 1819   GFRNONAA >90 01/05/2013 1110   GFRAA >90 01/05/2013 1110    No results found for this basename: chol, tri, ldl    No components found with this basename: hga1c    Lab Results  Component Value Date/Time   AST 84* 04/24/2007  6:19 PM    Assessment and Plan  DM (diabetes mellitus) type 2, uncontrolled, with ketoacidosis - Plan:  Results for orders placed in visit on 11/26/13  GLUCOSE, POCT (MANUAL RESULT ENTRY)      Result Value Ref Range   POC Glucose 130 (*) 70 - 99 mg/dl  POCT GLYCOSYLATED HEMOGLOBIN (HGB A1C)      Result Value Ref Range   Hemoglobin A1C 7.8     Glucose (CBG), HgB A1c is 7.47 not much changed from last visit, I have advised patient for diabetes meal planning, she'll continue with metformin, I have increased the dose of Glucotrol to 10 mg twice a day, advised to keep the fingerstick log and bring on the next visit. , glipiZIDE (GLUCOTROL) 10 MG tablet, will also check Lipid panel, COMPLETE METABOLIC PANEL WITH GFR  Essential hypertension - Plan: Pressure is borderline elevated today, she has not taken her medication advised for DASH diet continue with current meds will repeat her blood chemistry COMPLETE METABOLIC PANEL WITH GFR  Special  screening for malignant neoplasms, colon - Plan: Ambulatory referral to Gastroenterology   Health Maintenance -Colonoscopy: referred to GI  -Mammogram: uptodate  -Vaccinations:   -Influenza  Return in about 3 months (around 02/25/2014) for diabetes, hypertension.  Lorayne Marek, MD

## 2013-11-26 NOTE — Progress Notes (Signed)
Pt here for 3 mnth f/u Diabetes,Htn with medication management Pt is taking medication daily as prescribed Denies blurry vision,dizziness or headaches Need some med refill Spanish interpretor present

## 2013-12-07 ENCOUNTER — Encounter: Payer: Self-pay | Admitting: Gastroenterology

## 2013-12-22 ENCOUNTER — Ambulatory Visit (AMBULATORY_SURGERY_CENTER): Payer: Self-pay | Admitting: *Deleted

## 2013-12-22 VITALS — Ht 65.0 in | Wt 216.6 lb

## 2013-12-22 DIAGNOSIS — Z1211 Encounter for screening for malignant neoplasm of colon: Secondary | ICD-10-CM

## 2013-12-22 MED ORDER — MOVIPREP 100 G PO SOLR: ORAL | Status: DC

## 2013-12-22 NOTE — Progress Notes (Signed)
Patient denies any allergies to eggs or soy. Patient states blood pressure goes up with anesthesia. Patient denies any oxygen use at home and does not take any diet/weight loss medications. No email per patient.

## 2014-01-03 ENCOUNTER — Telehealth: Payer: Self-pay | Admitting: Gastroenterology

## 2014-01-03 ENCOUNTER — Encounter: Payer: Self-pay | Admitting: Gastroenterology

## 2014-01-03 NOTE — Telephone Encounter (Signed)
If she reschedules the appt, then no charge.  If she does not reschedule it, then charge the no show fee

## 2014-02-04 ENCOUNTER — Telehealth: Payer: Self-pay | Admitting: Internal Medicine

## 2014-02-04 ENCOUNTER — Other Ambulatory Visit: Payer: Self-pay | Admitting: Internal Medicine

## 2014-02-04 NOTE — Telephone Encounter (Signed)
Pt. Came into facility to request a refill for lisinopril-hydrochlorothiazide (PRINZIDE,ZESTORETIC) 10-12.5 MG per tablet. Pt. States that she will run out on Monday 02/07/2014. Please f/u with pt.

## 2014-02-24 ENCOUNTER — Other Ambulatory Visit: Payer: Self-pay

## 2014-03-01 ENCOUNTER — Telehealth: Payer: Self-pay | Admitting: Internal Medicine

## 2014-03-08 ENCOUNTER — Encounter: Payer: Self-pay | Admitting: Internal Medicine

## 2014-03-08 ENCOUNTER — Ambulatory Visit: Payer: Self-pay | Attending: Internal Medicine | Admitting: Internal Medicine

## 2014-03-08 VITALS — BP 140/80 | HR 94 | Temp 98.0°F | Resp 16

## 2014-03-08 DIAGNOSIS — I10 Essential (primary) hypertension: Secondary | ICD-10-CM | POA: Insufficient documentation

## 2014-03-08 DIAGNOSIS — K219 Gastro-esophageal reflux disease without esophagitis: Secondary | ICD-10-CM | POA: Insufficient documentation

## 2014-03-08 DIAGNOSIS — E785 Hyperlipidemia, unspecified: Secondary | ICD-10-CM | POA: Insufficient documentation

## 2014-03-08 DIAGNOSIS — Z79899 Other long term (current) drug therapy: Secondary | ICD-10-CM | POA: Insufficient documentation

## 2014-03-08 DIAGNOSIS — E119 Type 2 diabetes mellitus without complications: Secondary | ICD-10-CM | POA: Insufficient documentation

## 2014-03-08 DIAGNOSIS — E139 Other specified diabetes mellitus without complications: Secondary | ICD-10-CM

## 2014-03-08 LAB — GLUCOSE, POCT (MANUAL RESULT ENTRY): POC Glucose: 266 mg/dl — AB (ref 70–99)

## 2014-03-08 LAB — POCT GLYCOSYLATED HEMOGLOBIN (HGB A1C): Hemoglobin A1C: 8.7

## 2014-03-08 MED ORDER — SITAGLIPTIN-METFORMIN HCL 50-1000 MG PO TABS
1.0000 | ORAL_TABLET | Freq: Two times a day (BID) | ORAL | Status: DC
Start: 2014-03-08 — End: 2014-04-20

## 2014-03-08 MED ORDER — OMEPRAZOLE 20 MG PO CPDR: 20.0000 mg | DELAYED_RELEASE_CAPSULE | Freq: Every day | ORAL | Status: DC

## 2014-03-08 MED ORDER — GLIPIZIDE 10 MG PO TABS: 10.0000 mg | ORAL_TABLET | Freq: Two times a day (BID) | ORAL | Status: DC

## 2014-03-08 MED ORDER — LISINOPRIL-HYDROCHLOROTHIAZIDE 10-12.5 MG PO TABS: 1.0000 | ORAL_TABLET | Freq: Every day | ORAL | Status: DC

## 2014-03-08 MED ORDER — GLUCOSE BLOOD VI STRP: ORAL_STRIP | Status: DC

## 2014-03-08 NOTE — Progress Notes (Signed)
Patient here for routine three month check up on her diabetes Complains of having some burning in her stomach after she eats certain foods

## 2014-03-08 NOTE — Progress Notes (Signed)
MRN: [id number] Name: Carmen Burns  Sex: female Age: 53 y.o. DOB: [birth year]  Allergies: Penicillins  Chief Complaint  Patient presents with  . Follow-up    HPI: Patient is 53 y.o. female who has history of diabetes hypertension comes today for followup as per patient she ran out of her blood pressure medication and have missed a few doses, today requesting refill on her medications, as per patient her fasting sugar has been using more than 1 50 mg/dL currently she is taking Glucotrol as well as on metformin thousand milligram twice a day, she denies any hypoglycemic symptoms, today her hemoglobin A1c noticed to have trended up currently 8.7%. Patient also complaining of reflux symptoms.  Past Medical History  Diagnosis Date  . Hypertension   . Hyperlipidemia   . Diabetes mellitus without complication   . Anemia 2013  . Refusal of blood transfusions as patient is Jehovah's Witness     Past Surgical History  Procedure Laterality Date  . Appendectomy    . Dilitation & currettage/hystroscopy with novasure ablation N/A 01/12/2013    Procedure: DILATATION & CURETTAGE/HYSTEROSCOPY WITH NOVASURE ABLATION;  Surgeon: Osborne Oman, MD;  Location: [facility];  Service: Gynecology;  Laterality: N/A;      Medication List       This list is accurate as of: 03/08/14  3:50 PM.  Always use your most recent med list.               docusate sodium 100 MG capsule  Commonly known as:  COLACE  Take 1 capsule (100 mg total) by mouth 2 (two) times daily as needed for constipation.     ferrous sulfate 325 (65 FE) MG tablet  Take 325 mg by mouth daily with breakfast.     glipiZIDE 10 MG tablet  Commonly known as:  GLUCOTROL  Take 1 tablet (10 mg total) by mouth 2 (two) times daily before a meal.     glucose monitoring kit monitoring kit  1 each by Does not apply route 4 (four) times daily - after meals and at bedtime. 1 month Diabetic Testing Supplies for QAC-QHS accuchecks.     lisinopril-hydrochlorothiazide 10-12.5 MG per tablet  Commonly known as:  PRINZIDE,ZESTORETIC  Take 1 tablet by mouth daily.     MOVIPREP 100 G Solr  Generic drug:  peg 3350 powder  MoviPrep (no substitutions)-TAKE AS DIRECTED.  "free coupon" given to patient.     multivitamin with minerals Tabs tablet  Take 1 tablet by mouth daily.     naproxen sodium 550 MG tablet  Commonly known as:  ANAPROX  Take 1 tablet (550 mg total) by mouth 2 (two) times daily with a meal. For pain     omeprazole 20 MG capsule  Commonly known as:  PRILOSEC  Take 1 capsule (20 mg total) by mouth daily.     phenazopyridine 95 MG tablet  Commonly known as:  PYRIDIUM  Take 1 tablet (95 mg total) by mouth 3 (three) times daily as needed for pain.     sitaGLIPtin-metformin 50-1000 MG per tablet  Commonly known as:  JANUMET  Take 1 tablet by mouth 2 (two) times daily with a meal.     UNABLE TO FIND  Take 1 tablet by mouth daily. Med Name: "Herbal Life"        Meds ordered this encounter  Medications  . sitaGLIPtin-metformin (JANUMET) 50-1000 MG per tablet    Sig: Take 1 tablet by mouth 2 (two) times  daily with a meal.    Dispense:  60 tablet    Refill:  4  . glipiZIDE (GLUCOTROL) 10 MG tablet    Sig: Take 1 tablet (10 mg total) by mouth 2 (two) times daily before a meal.    Dispense:  60 tablet    Refill:  4  . lisinopril-hydrochlorothiazide (PRINZIDE,ZESTORETIC) 10-12.5 MG per tablet    Sig: Take 1 tablet by mouth daily.    Dispense:  30 tablet    Refill:  4  . omeprazole (PRILOSEC) 20 MG capsule    Sig: Take 1 capsule (20 mg total) by mouth daily.    Dispense:  30 capsule    Refill:  3    Immunization History  Administered Date(s) Administered  . Influenza Split 05/01/2012, 11/25/2012  . Influenza,inj,Quad PF,36+ Mos 11/26/2013    Family History  Problem Relation Age of Onset  . Diabetes Brother   . Hypertension Brother   . Colon cancer Neg Hx     History  Substance Use Topics    . Smoking status: Never Smoker   . Smokeless tobacco: Never Used  . Alcohol Use: No    Review of Systems   As noted in HPI  Filed Vitals:   03/08/14 1550  BP: 140/80  Pulse:   Temp:   Resp:     Physical Exam  Physical Exam  Constitutional: No distress.  Eyes: EOM are normal.  Cardiovascular: Normal rate and regular rhythm.   Pulmonary/Chest: Breath sounds normal. No respiratory distress. She has no wheezes. She has no rales.  Musculoskeletal: She exhibits no edema.    CBC    Component Value Date/Time   WBC 7.9 01/05/2013 1110   WBC 12.2* 04/30/2012 1328   RBC 4.36 01/05/2013 1110   RBC 3.54* 04/30/2012 1709   RBC 3.98* 04/30/2012 1328   HGB 12.3 01/05/2013 1110   HGB 6.9* 04/30/2012 1328   HCT 37.3 01/05/2013 1110   HCT 23.9* 04/30/2012 1328   PLT 311 01/05/2013 1110   MCV 85.6 01/05/2013 1110   MCV 60.0* 04/30/2012 1328   LYMPHSABS 2.9 04/30/2012 1613   MONOABS 0.7 04/30/2012 1613   EOSABS 0.1 04/30/2012 1613   BASOSABS 0.1 04/30/2012 1613    CMP     Component Value Date/Time   NA 136 01/05/2013 1110   K 4.5 01/05/2013 1110   CL 99 01/05/2013 1110   CO2 26 01/05/2013 1110   GLUCOSE 179* 01/05/2013 1110   BUN 10 01/05/2013 1110   CREATININE 0.64 01/05/2013 1110   CALCIUM 10.4 01/05/2013 1110   PROT 7.9 04/24/2007 1819   ALBUMIN 3.2* 04/24/2007 1819   AST 84* 04/24/2007 1819   ALT 38* 04/24/2007 1819   ALKPHOS 125* 04/24/2007 1819   BILITOT 2.1* 04/24/2007 1819   GFRNONAA >90 01/05/2013 1110   GFRAA >90 01/05/2013 1110    No results found for: CHOL  No components found for: HGA1C  Lab Results  Component Value Date/Time   AST 84* 04/24/2007 06:19 PM    Assessment and Plan  Other specified diabetes mellitus without complications - Plan:  Results for orders placed or performed in visit on 03/08/14  Glucose (CBG)  Result Value Ref Range   POC Glucose 266 (A) 70 - 99 mg/dl  HgB A1c  Result Value Ref Range   Hemoglobin A1C 8.7     Hemoglobin A1c noticed to have trended up her, she'll continue with Glucotrol, I have discontinued metformin and switch to Janumet, advise for diabetes  meal planning, will repeat A1c in 3 months, sitaGLIPtin-metformin (JANUMET) 50-1000 MG per tablet, glipiZIDE (GLUCOTROL) 10 MG tablet  Essential hypertension - Plan: advised patient for DASH diet, she is given refill on medications lisinopril-hydrochlorothiazide (PRINZIDE,ZESTORETIC) 10-12.5 MG per tablet, will check blood chemistry on the next visit  Gastroesophageal reflux disease, esophagitis presence not specified - Plan: advised for lifestyle modification, trial of omeprazole (PRILOSEC) 20 MG capsule   Health Maintenance  -Vaccinations: Up-to-date with flu shot.   Return in about 3 months (around 06/07/2014) for diabetes, hypertension.  Lorayne Marek, MD

## 2014-03-30 NOTE — Telephone Encounter (Signed)
Pt NOT Billed Colon Cx fee/Indigent/Medicaid Pending/yf

## 2014-04-01 ENCOUNTER — Telehealth: Payer: Self-pay | Admitting: Internal Medicine

## 2014-04-01 ENCOUNTER — Telehealth: Payer: Self-pay | Admitting: *Deleted

## 2014-04-01 NOTE — Telephone Encounter (Signed)
Its ok to change

## 2014-04-01 NOTE — Telephone Encounter (Signed)
Pt experienced Nausea, sleepiness, irritability, and bad stomach aches when she was on sitaGLIPtin-metformin (JANUMET) 50-1000 MG per tablet. She prefers to take glipiZIDE (GLUCOTROL) 10 MG tablet. Please follow up with pt.

## 2014-04-04 NOTE — Telephone Encounter (Signed)
Pt stated is taking Glipizide 10mg , stopped taking janumet     Carmen Marek, MD at 04/01/2014 5:29 PM    Status: Signed      Expand All Collapse All    Its ok to change

## 2014-04-11 ENCOUNTER — Telehealth: Payer: Self-pay | Admitting: Internal Medicine

## 2014-04-11 NOTE — Telephone Encounter (Signed)
Patient came into facility stating that she is still having stomach pain and would like to speak to her PCP. Please f/u with pt.

## 2014-04-20 ENCOUNTER — Encounter: Payer: Self-pay | Admitting: Internal Medicine

## 2014-04-20 ENCOUNTER — Ambulatory Visit: Payer: Self-pay | Attending: Internal Medicine | Admitting: Internal Medicine

## 2014-04-20 VITALS — BP 150/79 | HR 96 | Temp 98.0°F | Resp 16 | Wt 218.8 lb

## 2014-04-20 DIAGNOSIS — Z79899 Other long term (current) drug therapy: Secondary | ICD-10-CM | POA: Insufficient documentation

## 2014-04-20 DIAGNOSIS — I10 Essential (primary) hypertension: Secondary | ICD-10-CM | POA: Insufficient documentation

## 2014-04-20 DIAGNOSIS — Z791 Long term (current) use of non-steroidal anti-inflammatories (NSAID): Secondary | ICD-10-CM | POA: Insufficient documentation

## 2014-04-20 DIAGNOSIS — N898 Other specified noninflammatory disorders of vagina: Secondary | ICD-10-CM

## 2014-04-20 DIAGNOSIS — E139 Other specified diabetes mellitus without complications: Secondary | ICD-10-CM

## 2014-04-20 DIAGNOSIS — L298 Other pruritus: Secondary | ICD-10-CM | POA: Insufficient documentation

## 2014-04-20 DIAGNOSIS — E119 Type 2 diabetes mellitus without complications: Secondary | ICD-10-CM | POA: Insufficient documentation

## 2014-04-20 LAB — POCT URINALYSIS DIPSTICK
BILIRUBIN UA: NEGATIVE
GLUCOSE UA: 500
Ketones, UA: NEGATIVE
Leukocytes, UA: NEGATIVE
Nitrite, UA: NEGATIVE
PH UA: 6
Protein, UA: NEGATIVE
SPEC GRAV UA: 1.01
Urobilinogen, UA: 0.2

## 2014-04-20 LAB — GLUCOSE, POCT (MANUAL RESULT ENTRY): POC Glucose: 338 mg/dl — AB (ref 70–99)

## 2014-04-20 MED ORDER — INSULIN ASPART 100 UNIT/ML ~~LOC~~ SOLN
10.0000 [IU] | Freq: Once | SUBCUTANEOUS | Status: AC
  Administered 2014-04-20: 10 [IU] via SUBCUTANEOUS

## 2014-04-20 MED ORDER — METFORMIN HCL 1000 MG PO TABS: 1000.0000 mg | ORAL_TABLET | Freq: Two times a day (BID) | ORAL | Status: DC

## 2014-04-20 MED ORDER — SITAGLIPTIN PHOSPHATE 100 MG PO TABS: 100.0000 mg | ORAL_TABLET | Freq: Every day | ORAL | Status: DC

## 2014-04-20 MED ORDER — FLUCONAZOLE 150 MG PO TABS: 150.0000 mg | ORAL_TABLET | Freq: Once | ORAL | Status: DC

## 2014-04-20 NOTE — Patient Instructions (Signed)
La diabetes mellitus y los alimentos (Diabetes Mellitus and Food) Es importante que controle su nivel de azcar en la sangre (glucosa). El nivel de glucosa en sangre depende en gran medida de lo que usted come. Comer alimentos saludables en las cantidades [place] a lo largo del Training and development officer, aproximadamente a la misma hora [employer], lo ayudar a Chief Technology Officer su nivel de Multimedia programmer. Tambin puede ayudarlo a retrasar o Patent attorney de la diabetes mellitus. Comer de [employer] saludable incluso puede ayudarlo a Chartered loss adjuster de presin arterial y a Science writer o Theatre manager un peso saludable.  CMO PUEDEN AFECTARME LOS ALIMENTOS? Carbohidratos Los carbohidratos afectan el nivel de glucosa en sangre ms que cualquier otro tipo de alimento. El nutricionista lo ayudar a Teacher, adult education cuntos carbohidratos puede consumir en cada comida y ensearle a contarlos. El recuento de carbohidratos es importante para mantener la glucosa en sangre en un nivel saludable, en especial si utiliza insulina o toma determinados medicamentos para la diabetes mellitus. Alcohol El alcohol puede provocar disminuciones sbitas de la glucosa en sangre (hipoglucemia), en especial si utiliza insulina o toma determinados medicamentos para la diabetes mellitus. La hipoglucemia es una afeccin que puede poner en peligro la vida. Los sntomas de la hipoglucemia (somnolencia, mareos y Data processing manager) son similares a los sntomas de haber consumido mucho alcohol.  Si el mdico lo autoriza a beber alcohol, hgalo con moderacin y siga estas pautas:  Las mujeres no deben beber ms de un trago por da, y los hombres no deben beber ms de dos tragos por Training and development officer. Un trago es igual a:  12 onzas (355 ml) de cerveza  5 onzas de vino (150 ml) de vino  1,5onzas (35ml) de bebidas espirituosas  No beba con el estmago vaco.  Mantngase hidratado. Beba agua, gaseosas dietticas o t helado sin azcar.  Las gaseosas comunes, los jugos y  otros refrescos podran contener muchos carbohidratos y se Civil Service fast streamer. QU ALIMENTOS NO SE RECOMIENDAN? Cuando haga las elecciones de alimentos, es importante que recuerde que todos los alimentos son distintos. Algunos tienen menos nutrientes que otros por porcin, aunque podran tener la misma cantidad de caloras o carbohidratos. Es difcil darle al cuerpo lo que necesita cuando consume alimentos con menos nutrientes. Estos son algunos ejemplos de alimentos que debera evitar ya que contienen muchas caloras y carbohidratos, pero pocos nutrientes:  Physicist, medical trans (la mayora de los alimentos procesados incluyen grasas trans en la etiqueta de Informacin nutricional).  Gaseosas comunes.  Jugos.  Caramelos.  Dulces, como tortas, pasteles, rosquillas y [place].  Comidas fritas. QU ALIMENTOS PUEDO COMER? Consuma alimentos ricos en nutrientes, que nutrirn el cuerpo y lo mantendrn saludable. Los alimentos que debe comer tambin dependern de varios factores, como:  Las caloras que necesita.  Los medicamentos que toma.  Su peso.  El nivel de glucosa en [place].  El [place] de presin arterial.  El nivel de colesterol. Tambin debe consumir una variedad de [place], como:  Protenas, como carne, aves, pescado, tofu, frutos secos y semillas (las protenas de [place] magros son mejores).  Lambert Mody.  Verduras.  Productos lcteos, como [place], queso y yogur (descremados son mejores).  Panes, granos, pastas, cereales, arroz y frijoles.  Grasas, como aceite de [place], [place] sin grasas trans, aceite de canola, aguacate y [place]. TODOS LOS QUE PADECEN DIABETES MELLITUS TIENEN EL [place] PLAN DE [place]? Dado que todas las personas que padecen diabetes mellitus son distintas, no hay un solo plan de comidas que funcione para todos. Es muy  importante que se rena con un nutricionista que lo ayudar a crear un plan de comidas adecuado para usted. Document Released: 06/18/2007  Document Revised: 03/16/2013 [facility] Patient Information 2015 [place]. This information is not intended to replace advice given to you by your health care provider. Make sure you discuss any questions you have with your health care provider.

## 2014-04-20 NOTE — Progress Notes (Signed)
MRN: [id number] Name: Carmen Burns  Sex: female Age: 54 y.o. DOB: 26-Dec-1960  Allergies: Penicillins  Chief Complaint  Patient presents with  . Abdominal Pain    HPI: Patient is 54 y.o. female who has history of diabetes, on the last visit she was switched from metformin to [facility], she reported abdominal bloating nausea vomiting the symptoms of single improved when she discontinued the medication but her sugar has been running high she is complaining of vaginal itching denies any vaginal discharge, today her urine noticed to have glycosuria, negative for infection.  Past Medical History  Diagnosis Date  . Hypertension   . Hyperlipidemia   . Diabetes mellitus without complication   . Anemia 2013  . Refusal of blood transfusions as patient is Jehovah's Witness     Past Surgical History  Procedure Laterality Date  . Appendectomy    . Dilitation & currettage/hystroscopy with novasure ablation N/A 01/12/2013    Procedure: DILATATION & CURETTAGE/HYSTEROSCOPY WITH NOVASURE ABLATION;  Surgeon: Osborne Oman, MD;  Location: [facility];  Service: Gynecology;  Laterality: N/A;      Medication List       This list is accurate as of: 04/20/14  4:29 PM.  Always use your most recent med list.               docusate sodium 100 MG capsule  Commonly known as:  COLACE  Take 1 capsule (100 mg total) by mouth 2 (two) times daily as needed for constipation.     ferrous sulfate 325 (65 FE) MG tablet  Take 325 mg by mouth daily with breakfast.     fluconazole 150 MG tablet  Commonly known as:  DIFLUCAN  Take 1 tablet (150 mg total) by mouth once.     glipiZIDE 10 MG tablet  Commonly known as:  GLUCOTROL  Take 1 tablet (10 mg total) by mouth 2 (two) times daily before a meal.     glucose blood test strip  Commonly known as:  CHOICE DM FORA G20 TEST STRIPS  Use as instructed     glucose monitoring kit monitoring kit  1 each by Does not apply route 4 (four) times daily -  after meals and at bedtime. 1 month Diabetic Testing Supplies for QAC-QHS accuchecks.     lisinopril-hydrochlorothiazide 10-12.5 MG per tablet  Commonly known as:  PRINZIDE,ZESTORETIC  Take 1 tablet by mouth daily.     metFORMIN 1000 MG tablet  Commonly known as:  GLUCOPHAGE  Take 1 tablet (1,000 mg total) by mouth 2 (two) times daily with a meal.     MOVIPREP 100 G Solr  Generic drug:  peg 3350 powder  MoviPrep (no substitutions)-TAKE AS DIRECTED.  "free coupon" given to patient.     multivitamin with minerals Tabs tablet  Take 1 tablet by mouth daily.     naproxen sodium 550 MG tablet  Commonly known as:  ANAPROX  Take 1 tablet (550 mg total) by mouth 2 (two) times daily with a meal. For pain     omeprazole 20 MG capsule  Commonly known as:  PRILOSEC  Take 1 capsule (20 mg total) by mouth daily.     phenazopyridine 95 MG tablet  Commonly known as:  PYRIDIUM  Take 1 tablet (95 mg total) by mouth 3 (three) times daily as needed for pain.     sitaGLIPtin 100 MG tablet  Commonly known as:  JANUVIA  Take 1 tablet (100 mg total) by mouth daily.  UNABLE TO FIND  Take 1 tablet by mouth daily. Med Name: "Herbal Life"        Meds ordered this encounter  Medications  . insulin aspart (novoLOG) injection 10 Units    Sig:   . metFORMIN (GLUCOPHAGE) 1000 MG tablet    Sig: Take 1 tablet (1,000 mg total) by mouth 2 (two) times daily with a meal.    Dispense:  180 tablet    Refill:  3  . sitaGLIPtin (JANUVIA) 100 MG tablet    Sig: Take 1 tablet (100 mg total) by mouth daily.    Dispense:  30 tablet    Refill:  3  . fluconazole (DIFLUCAN) 150 MG tablet    Sig: Take 1 tablet (150 mg total) by mouth once.    Dispense:  1 tablet    Refill:  0    Immunization History  Administered Date(s) Administered  . Influenza Split 05/01/2012, 11/25/2012  . Influenza,inj,Quad PF,36+ Mos 11/26/2013    Family History  Problem Relation Age of Onset  . Diabetes Brother   .  Hypertension Brother   . Colon cancer Neg Hx     History  Substance Use Topics  . Smoking status: Never Smoker   . Smokeless tobacco: Never Used  . Alcohol Use: No    Review of Systems   As noted in HPI  Filed Vitals:   04/20/14 1542  BP: 150/79  Pulse: 96  Temp: 98 F (36.7 C)  Resp: 16    Physical Exam  Physical Exam  Constitutional: No distress.  Eyes: EOM are normal. Pupils are equal, round, and reactive to light.  Cardiovascular: Normal rate.   Pulmonary/Chest: Breath sounds normal. No respiratory distress. She has no wheezes. She has no rales.  Abdominal: Soft. There is no tenderness. There is no rebound and no guarding.    CBC    Component Value Date/Time   WBC 7.9 01/05/2013 1110   WBC 12.2* 04/30/2012 1328   RBC 4.36 01/05/2013 1110   RBC 3.54* 04/30/2012 1709   RBC 3.98* 04/30/2012 1328   HGB 12.3 01/05/2013 1110   HGB 6.9* 04/30/2012 1328   HCT 37.3 01/05/2013 1110   HCT 23.9* 04/30/2012 1328   PLT 311 01/05/2013 1110   MCV 85.6 01/05/2013 1110   MCV 60.0* 04/30/2012 1328   LYMPHSABS 2.9 04/30/2012 1613   MONOABS 0.7 04/30/2012 1613   EOSABS 0.1 04/30/2012 1613   BASOSABS 0.1 04/30/2012 1613    CMP     Component Value Date/Time   NA 136 01/05/2013 1110   K 4.5 01/05/2013 1110   CL 99 01/05/2013 1110   CO2 26 01/05/2013 1110   GLUCOSE 179* 01/05/2013 1110   BUN 10 01/05/2013 1110   CREATININE 0.64 01/05/2013 1110   CALCIUM 10.4 01/05/2013 1110   PROT 7.9 04/24/2007 1819   ALBUMIN 3.2* 04/24/2007 1819   AST 84* 04/24/2007 1819   ALT 38* 04/24/2007 1819   ALKPHOS 125* 04/24/2007 1819   BILITOT 2.1* 04/24/2007 1819   GFRNONAA >90 01/05/2013 1110   GFRAA >90 01/05/2013 1110    No results found for: CHOL  No components found for: HGA1C  Lab Results  Component Value Date/Time   AST 84* 04/24/2007 06:19 PM    Assessment and Plan  Other specified diabetes mellitus without complications - Plan:  Results for orders placed or  performed in visit on 04/20/14  Glucose (CBG)  Result Value Ref Range   POC Glucose 338 (A) 70 - 99  mg/dl  Urinalysis Dipstick  Result Value Ref Range   Color, UA yellow    Clarity, UA clear    Glucose, UA 500    Bilirubin, UA neg    Ketones, UA neg    Spec Grav, UA 1.010    Blood, UA small    pH, UA 6.0    Protein, UA neg    Urobilinogen, UA 0.2    Nitrite, UA neg    Leukocytes, UA Negative    Blood sugar is elevated likely secondary to patient stopped taking Janumet, She was already metformin in the past, I have advised patient for diabetes meal planning continue with Glucotrol I have prescribed her separately metformin and Januvia. insulin aspart (novoLOG) injection 10 Units, Urinalysis Dipstick, metFORMIN (GLUCOPHAGE) 1000 MG tablet, sitaGLIPtin (JANUVIA) 100 MG tablet Repeat A1c in 3 months.  Vaginal itching - Plan: fluconazole (DIFLUCAN) 150 MG tablet   Return in about 3 months (around 07/20/2014).  Lorayne Marek, MD

## 2014-04-20 NOTE — Progress Notes (Signed)
Interpreter line used Patient states since she has been switched to janumet She complains of stomach pain and burning in her rectum Patient also complains of having some burning and itching in her vaginal area

## 2014-06-17 ENCOUNTER — Other Ambulatory Visit: Payer: Self-pay

## 2014-07-26 ENCOUNTER — Telehealth: Payer: Self-pay | Admitting: Family Medicine

## 2014-07-26 ENCOUNTER — Telehealth: Payer: Self-pay | Admitting: Internal Medicine

## 2014-07-26 DIAGNOSIS — E118 Type 2 diabetes mellitus with unspecified complications: Secondary | ICD-10-CM

## 2014-07-26 MED ORDER — SITAGLIPTIN PHOS-METFORMIN HCL 50-500 MG PO TABS: 1.0000 | ORAL_TABLET | Freq: Two times a day (BID) | ORAL | Status: DC

## 2014-07-26 MED ORDER — METFORMIN HCL 500 MG PO TABS: 500.0000 mg | ORAL_TABLET | Freq: Two times a day (BID) | ORAL | Status: DC

## 2014-07-26 NOTE — Telephone Encounter (Signed)
LVM to return call.

## 2014-07-26 NOTE — Telephone Encounter (Signed)
Patient is returning call from RMA, please f/u with pt.

## 2014-07-26 NOTE — Telephone Encounter (Signed)
Patient cannot get [place] via [facility] Sent in jaumet + metformin 500 mg to replace junuvia and metformin 1000 mg  Please inform patient, spanish speaker

## 2014-08-23 ENCOUNTER — Ambulatory Visit: Payer: Self-pay | Admitting: Internal Medicine

## 2014-08-25 ENCOUNTER — Ambulatory Visit: Payer: Self-pay | Attending: Internal Medicine | Admitting: Internal Medicine

## 2014-08-25 ENCOUNTER — Encounter: Payer: Self-pay | Admitting: Internal Medicine

## 2014-08-25 VITALS — BP 132/87 | HR 83 | Temp 98.0°F | Resp 16 | Wt 217.0 lb

## 2014-08-25 DIAGNOSIS — D649 Anemia, unspecified: Secondary | ICD-10-CM | POA: Insufficient documentation

## 2014-08-25 DIAGNOSIS — B351 Tinea unguium: Secondary | ICD-10-CM | POA: Insufficient documentation

## 2014-08-25 DIAGNOSIS — Z79899 Other long term (current) drug therapy: Secondary | ICD-10-CM | POA: Insufficient documentation

## 2014-08-25 DIAGNOSIS — E119 Type 2 diabetes mellitus without complications: Secondary | ICD-10-CM | POA: Insufficient documentation

## 2014-08-25 DIAGNOSIS — E139 Other specified diabetes mellitus without complications: Secondary | ICD-10-CM

## 2014-08-25 DIAGNOSIS — K219 Gastro-esophageal reflux disease without esophagitis: Secondary | ICD-10-CM | POA: Insufficient documentation

## 2014-08-25 DIAGNOSIS — I10 Essential (primary) hypertension: Secondary | ICD-10-CM | POA: Insufficient documentation

## 2014-08-25 LAB — COMPLETE METABOLIC PANEL WITH GFR
ALBUMIN: 4.1 g/dL (ref 3.5–5.2)
ALK PHOS: 95 U/L (ref 39–117)
ALT: 29 U/L (ref 0–35)
AST: 31 U/L (ref 0–37)
BILIRUBIN TOTAL: 0.5 mg/dL (ref 0.2–1.2)
BUN: 13 mg/dL (ref 6–23)
CHLORIDE: 100 meq/L (ref 96–112)
CO2: 28 meq/L (ref 19–32)
CREATININE: 0.69 mg/dL (ref 0.50–1.10)
Calcium: 9.6 mg/dL (ref 8.4–10.5)
GFR, Est African American: >89 mL/min
Glucose, Bld: 171 mg/dL — ABNORMAL HIGH (ref 70–99)
POTASSIUM: 5.2 meq/L (ref 3.5–5.3)
Sodium: 135 mEq/L (ref 135–145)
Total Protein: 7.2 g/dL (ref 6.0–8.3)

## 2014-08-25 LAB — LIPID PANEL
CHOL/HDL RATIO: 3.7 ratio
Cholesterol: 199 mg/dL (ref 0–200)
HDL: 54 mg/dL (ref >=46)
LDL Cholesterol: 111 mg/dL — ABNORMAL HIGH (ref 0–99)
Triglycerides: 169 mg/dL — ABNORMAL HIGH (ref <150)
VLDL: 34 mg/dL (ref 0–40)

## 2014-08-25 LAB — GLUCOSE, POCT (MANUAL RESULT ENTRY): POC Glucose: 182 mg/dl — AB (ref 70–99)

## 2014-08-25 LAB — POCT GLYCOSYLATED HEMOGLOBIN (HGB A1C): HEMOGLOBIN A1C: 8.2

## 2014-08-25 MED ORDER — LISINOPRIL-HYDROCHLOROTHIAZIDE 10-12.5 MG PO TABS: 1.0000 | ORAL_TABLET | Freq: Every day | ORAL | Status: DC

## 2014-08-25 MED ORDER — METFORMIN HCL 500 MG PO TABS: 500.0000 mg | ORAL_TABLET | Freq: Two times a day (BID) | ORAL | Status: DC

## 2014-08-25 MED ORDER — OMEPRAZOLE 20 MG PO CPDR: 20.0000 mg | DELAYED_RELEASE_CAPSULE | Freq: Every day | ORAL | Status: DC

## 2014-08-25 MED ORDER — SITAGLIPTIN PHOS-METFORMIN HCL 50-500 MG PO TABS: 1.0000 | ORAL_TABLET | Freq: Two times a day (BID) | ORAL | Status: DC

## 2014-08-25 MED ORDER — TERBINAFINE HCL 250 MG PO TABS: 250.0000 mg | ORAL_TABLET | Freq: Every day | ORAL | Status: DC

## 2014-08-25 NOTE — Progress Notes (Signed)
Patient here with interpreter Patient here for follow up on her diabetes and for medication refills

## 2014-08-25 NOTE — Progress Notes (Signed)
MRN: [id number] Name: Carmen Burns  Sex: female Age: 54 y.o. DOB: July 16, 1960  Allergies: Penicillins  Chief Complaint  Patient presents with  . Follow-up    HPI: Patient is 54 y.o. female who has history of diabetes hypertension hyperlipidemia, comes today for followup, she denies any hypoglycemic symptoms, her hemoglobin A1c is slightly improved compared to last time, today she is complaining of having toenail discoloration.  Past Medical History  Diagnosis Date  . Hypertension   . Hyperlipidemia   . Diabetes mellitus without complication   . Anemia 2013  . Refusal of blood transfusions as patient is Jehovah's Witness     Past Surgical History  Procedure Laterality Date  . Appendectomy    . Dilitation & currettage/hystroscopy with novasure ablation N/A 01/12/2013    Procedure: DILATATION & CURETTAGE/HYSTEROSCOPY WITH NOVASURE ABLATION;  Surgeon: Osborne Oman, MD;  Location: [facility];  Service: Gynecology;  Laterality: N/A;      Medication List       This list is accurate as of: 08/25/14 11:06 AM.  Always use your most recent med list.               docusate sodium 100 MG capsule  Commonly known as:  COLACE  Take 1 capsule (100 mg total) by mouth 2 (two) times daily as needed for constipation.     ferrous sulfate 325 (65 FE) MG tablet  Take 325 mg by mouth daily with breakfast.     fluconazole 150 MG tablet  Commonly known as:  DIFLUCAN  Take 1 tablet (150 mg total) by mouth once.     glucose blood test strip  Commonly known as:  CHOICE DM FORA G20 TEST STRIPS  Use as instructed     glucose monitoring kit monitoring kit  1 each by Does not apply route 4 (four) times daily - after meals and at bedtime. 1 month Diabetic Testing Supplies for QAC-QHS accuchecks.     lisinopril-hydrochlorothiazide 10-12.5 MG per tablet  Commonly known as:  PRINZIDE,ZESTORETIC  Take 1 tablet by mouth daily.     metFORMIN 500 MG tablet  Commonly known as:  GLUCOPHAGE    Take 1 tablet (500 mg total) by mouth 2 (two) times daily with a meal.     MOVIPREP 100 G Solr  Generic drug:  peg 3350 powder  MoviPrep (no substitutions)-TAKE AS DIRECTED.  "free coupon" given to patient.     multivitamin with minerals Tabs tablet  Take 1 tablet by mouth daily.     naproxen sodium 550 MG tablet  Commonly known as:  ANAPROX  Take 1 tablet (550 mg total) by mouth 2 (two) times daily with a meal. For pain     omeprazole 20 MG capsule  Commonly known as:  PRILOSEC  Take 1 capsule (20 mg total) by mouth daily.     phenazopyridine 95 MG tablet  Commonly known as:  PYRIDIUM  Take 1 tablet (95 mg total) by mouth 3 (three) times daily as needed for pain.     sitaGLIPtin-metformin 50-500 MG per tablet  Commonly known as:  JANUMET  Take 1 tablet by mouth 2 (two) times daily with a meal.     terbinafine 250 MG tablet  Commonly known as:  LAMISIL  Take 1 tablet (250 mg total) by mouth daily.     UNABLE TO FIND  Take 1 tablet by mouth daily. Med Name: "Herbal Life"        Meds ordered this encounter  Medications  . lisinopril-hydrochlorothiazide (PRINZIDE,ZESTORETIC) 10-12.5 MG per tablet    Sig: Take 1 tablet by mouth daily.    Dispense:  30 tablet    Refill:  4  . omeprazole (PRILOSEC) 20 MG capsule    Sig: Take 1 capsule (20 mg total) by mouth daily.    Dispense:  30 capsule    Refill:  3  . metFORMIN (GLUCOPHAGE) 500 MG tablet    Sig: Take 1 tablet (500 mg total) by mouth 2 (two) times daily with a meal.    Dispense:  180 tablet    Refill:  1  . sitaGLIPtin-metformin (JANUMET) 50-500 MG per tablet    Sig: Take 1 tablet by mouth 2 (two) times daily with a meal.    Dispense:  60 tablet    Refill:  5  . terbinafine (LAMISIL) 250 MG tablet    Sig: Take 1 tablet (250 mg total) by mouth daily.    Dispense:  60 tablet    Refill:  0    Immunization History  Administered Date(s) Administered  . Influenza Split 05/01/2012, 11/25/2012  .  Influenza,inj,Quad PF,36+ Mos 11/26/2013    Family History  Problem Relation Age of Onset  . Diabetes Brother   . Hypertension Brother   . Colon cancer Neg Hx     History  Substance Use Topics  . Smoking status: Never Smoker   . Smokeless tobacco: Never Used  . Alcohol Use: No    Review of Systems  Respiratory: Negative for chest tightness and shortness of breath.   Cardiovascular: Negative for leg swelling.  Neurological: Negative for dizziness and headaches.     As noted in HPI  Filed Vitals:   08/25/14 1010  BP: 132/87  Pulse: 83  Temp: 98 F (36.7 C)  Resp: 16    Physical Exam  Physical Exam  Constitutional: No distress.  Eyes: EOM are normal. Pupils are equal, round, and reactive to light.  Cardiovascular: Normal rate and regular rhythm.   Pulmonary/Chest: Breath sounds normal. No respiratory distress. She has no wheezes. She has no rales.  Musculoskeletal:  Toenail onychomycosis    CBC    Component Value Date/Time   WBC 7.9 01/05/2013 1110   WBC 12.2* 04/30/2012 1328   RBC 4.36 01/05/2013 1110   RBC 3.54* 04/30/2012 1709   RBC 3.98* 04/30/2012 1328   HGB 12.3 01/05/2013 1110   HGB 6.9* 04/30/2012 1328   HCT 37.3 01/05/2013 1110   HCT 23.9* 04/30/2012 1328   PLT 311 01/05/2013 1110   MCV 85.6 01/05/2013 1110   MCV 60.0* 04/30/2012 1328   LYMPHSABS 2.9 04/30/2012 1613   MONOABS 0.7 04/30/2012 1613   EOSABS 0.1 04/30/2012 1613   BASOSABS 0.1 04/30/2012 1613    CMP     Component Value Date/Time   NA 136 01/05/2013 1110   K 4.5 01/05/2013 1110   CL 99 01/05/2013 1110   CO2 26 01/05/2013 1110   GLUCOSE 179* 01/05/2013 1110   BUN 10 01/05/2013 1110   CREATININE 0.64 01/05/2013 1110   CALCIUM 10.4 01/05/2013 1110   PROT 7.9 04/24/2007 1819   ALBUMIN 3.2* 04/24/2007 1819   AST 84* 04/24/2007 1819   ALT 38* 04/24/2007 1819   ALKPHOS 125* 04/24/2007 1819   BILITOT 2.1* 04/24/2007 1819   GFRNONAA >90 01/05/2013 1110   GFRAA >90 01/05/2013  1110    No results found for: CHOL  Lab Results  Component Value Date/Time   HGBA1C 8.20 08/25/2014 10:14 AM  HGBA1C 6.2* 04/30/2012 10:59 PM    Lab Results  Component Value Date/Time   AST 84* 04/24/2007 06:19 PM    Assessment and Plan  Other specified diabetes mellitus without complications - Plan:  Results for orders placed or performed in visit on 08/25/14  Glucose (CBG)  Result Value Ref Range   POC Glucose 182 (A) 70 - 99 mg/dl  HgB A1c  Result Value Ref Range   Hemoglobin A1C 8.20    Hemoglobin A1c has trended down but is still 0.2%, advised patient for diabetes meal planning, she will continue with Janumet and metformin, repeat A1c in 3 months , metFORMIN (GLUCOPHAGE) 500 MG tablet, sitaGLIPtin-metformin (JANUMET) 50-500 MG per tablet, Lipid panel  Essential hypertension - Plan: blood pressure is controlled continue with current lisinopril-hydrochlorothiazide (PRINZIDE,ZESTORETIC) 10-12.5 MG per tablet, COMPLETE METABOLIC PANEL WITH GFR  Gastroesophageal reflux disease, esophagitis presence not specified - Plan: Esther modification, continue with omeprazole (PRILOSEC) 20 MG capsule  Onychomycosis - Plan:I prescribed patient terbinafine (LAMISIL) 250 MG tablet to check for 2 months, repeat CBC and chemistry on the following visit   Return in about 3 months (around 11/25/2014) for diabetes, hypertension.   This note has been created with Surveyor, quantity. Any transcriptional errors are unintentional.    Lorayne Marek, MD

## 2014-10-26 ENCOUNTER — Other Ambulatory Visit: Payer: Self-pay | Admitting: Internal Medicine

## 2014-10-26 DIAGNOSIS — Z1231 Encounter for screening mammogram for malignant neoplasm of breast: Secondary | ICD-10-CM

## 2014-11-07 ENCOUNTER — Ambulatory Visit (HOSPITAL_COMMUNITY): Payer: Self-pay

## 2014-12-06 ENCOUNTER — Ambulatory Visit (HOSPITAL_COMMUNITY)
Admission: RE | Admit: 2014-12-06 | Discharge: 2014-12-06 | Disposition: A | Discharge status: Home / Self Care | Payer: Self-pay | Source: Ambulatory Visit | Attending: Internal Medicine | Admitting: Internal Medicine

## 2014-12-06 DIAGNOSIS — Z1231 Encounter for screening mammogram for malignant neoplasm of breast: Secondary | ICD-10-CM

## 2015-01-20 ENCOUNTER — Other Ambulatory Visit: Payer: Self-pay | Admitting: Internal Medicine

## 2015-01-20 ENCOUNTER — Telehealth: Payer: Self-pay | Admitting: Internal Medicine

## 2015-01-20 NOTE — Telephone Encounter (Signed)
Patient is here and will call for an appointment on Monday 01/23/2015; however, she is currently out of her syringes needed for diabetes management. Could we please refill this medication to last the patient until she can get an appointment? Please advise patient at the earliest convenience. Thank you, Fonda Kinder, ASA

## 2015-01-24 ENCOUNTER — Other Ambulatory Visit: Payer: Self-pay | Admitting: Pharmacist

## 2015-01-24 MED ORDER — "INSULIN SYRINGE 30G X 5/16"" 1 ML MISC": Status: DC

## 2015-01-24 MED ORDER — "INSULIN SYRINGE-NEEDLE U-100 30G X 5/16"" 1 ML MISC": Status: DC

## 2015-03-15 ENCOUNTER — Encounter: Payer: Self-pay | Admitting: Internal Medicine

## 2015-03-15 ENCOUNTER — Ambulatory Visit: Payer: Self-pay | Attending: Internal Medicine | Admitting: Internal Medicine

## 2015-03-15 VITALS — BP 141/85 | HR 93 | Temp 98.0°F | Resp 16 | Ht 65.0 in | Wt 215.0 lb

## 2015-03-15 DIAGNOSIS — E119 Type 2 diabetes mellitus without complications: Secondary | ICD-10-CM | POA: Insufficient documentation

## 2015-03-15 DIAGNOSIS — D649 Anemia, unspecified: Secondary | ICD-10-CM | POA: Insufficient documentation

## 2015-03-15 DIAGNOSIS — I10 Essential (primary) hypertension: Secondary | ICD-10-CM | POA: Insufficient documentation

## 2015-03-15 DIAGNOSIS — E785 Hyperlipidemia, unspecified: Secondary | ICD-10-CM | POA: Insufficient documentation

## 2015-03-15 DIAGNOSIS — Z794 Long term (current) use of insulin: Secondary | ICD-10-CM | POA: Insufficient documentation

## 2015-03-15 LAB — GLUCOSE, POCT (MANUAL RESULT ENTRY): POC GLUCOSE: 278 mg/dL — AB (ref 70–99)

## 2015-03-15 LAB — POCT GLYCOSYLATED HEMOGLOBIN (HGB A1C): HEMOGLOBIN A1C: 8.6

## 2015-03-15 MED ORDER — GLUCOCOM LANCETS 28G MISC: Status: DC

## 2015-03-15 MED ORDER — SITAGLIPTIN PHOS-METFORMIN HCL 50-1000 MG PO TABS: 1.0000 | ORAL_TABLET | Freq: Two times a day (BID) | ORAL | Status: DC

## 2015-03-15 MED ORDER — LISINOPRIL-HYDROCHLOROTHIAZIDE 10-12.5 MG PO TABS: 1.0000 | ORAL_TABLET | Freq: Every day | ORAL | Status: DC

## 2015-03-15 MED ORDER — NAPROXEN SODIUM 550 MG PO TABS: 550.0000 mg | ORAL_TABLET | Freq: Two times a day (BID) | ORAL | Status: DC

## 2015-03-15 MED ORDER — SITAGLIPTIN PHOS-METFORMIN HCL 50-500 MG PO TABS: 1.0000 | ORAL_TABLET | Freq: Two times a day (BID) | ORAL | Status: DC

## 2015-03-15 NOTE — Progress Notes (Signed)
Patient here for her routine diabetes check up

## 2015-03-15 NOTE — Progress Notes (Signed)
Patient ID: Carmen Burns, female   DOB: May 19, 1960, 54 y.o.   MRN: [id number] SUBJECTIVE: 54 y.o. female for follow up of diabetes. Diabetic Review of Systems - medication compliance: compliant all of the time, diabetic diet compliance: noncompliant some of the time, home glucose monitoring: is performed regularly, further diabetic ROS: no polyuria or polydipsia, no chest pain, dyspnea or TIA's, no numbness, tingling or pain in extremities, no hypoglycemia, blurry vision.  Other symptoms and concerns: Reports that she eats at least 3 tortillas with every meal daily.  Current Outpatient Prescriptions  Medication Sig Dispense Refill  . lisinopril-hydrochlorothiazide (PRINZIDE,ZESTORETIC) 10-12.5 MG tablet Take 1 tablet by mouth daily. 30 tablet 4  . naproxen sodium (ANAPROX) 550 MG tablet Take 1 tablet (550 mg total) by mouth 2 (two) times daily with a meal. For pain 30 tablet 2  . [DISCONTINUED] sitaGLIPtin-metformin (JANUMET) 50-500 MG tablet Take 1 tablet by mouth 2 (two) times daily with a meal. 60 tablet 5  . docusate sodium (COLACE) 100 MG capsule Take 1 capsule (100 mg total) by mouth 2 (two) times daily as needed for constipation. 30 capsule 2  . ferrous sulfate 325 (65 FE) MG tablet Take 325 mg by mouth daily with breakfast.    . fluconazole (DIFLUCAN) 150 MG tablet Take 1 tablet (150 mg total) by mouth once. 1 tablet 0  . glucose blood (CHOICE DM FORA G20 TEST STRIPS) test strip Use as instructed 100 each 12  . glucose monitoring kit (FREESTYLE) monitoring kit 1 each by Does not apply route 4 (four) times daily - after meals and at bedtime. 1 month Diabetic Testing Supplies for QAC-QHS accuchecks. 1 each 1  . Insulin Syringe-Needle U-100 (TRUEPLUS INSULIN SYRINGE) 30G X 5/16" 1 ML MISC CHECK BLOOD SUGAR TID AND QHS 100 each 2  . metFORMIN (GLUCOPHAGE) 500 MG tablet Take 1 tablet (500 mg total) by mouth 2 (two) times daily with a meal. 180 tablet 1  . MOVIPREP 100 G SOLR MoviPrep (no  substitutions)-TAKE AS DIRECTED.  "free coupon" given to patient. 1 kit 0  . Multiple Vitamin (MULTIVITAMIN WITH MINERALS) TABS Take 1 tablet by mouth daily.    Marland Kitchen omeprazole (PRILOSEC) 20 MG capsule Take 1 capsule (20 mg total) by mouth daily. 30 capsule 3  . phenazopyridine (PYRIDIUM) 95 MG tablet Take 1 tablet (95 mg total) by mouth 3 (three) times daily as needed for pain. 10 tablet 0  . terbinafine (LAMISIL) 250 MG tablet Take 1 tablet (250 mg total) by mouth daily. 60 tablet 0  . TRUEPLUS LANCETS 33G MISC USE TO TEST FOUR TIMES A DAY AFTER MEALS AND AT BEDTIME 100 each 12  . UNABLE TO FIND Take 1 tablet by mouth daily. Med Name: "Herbal Life"     No current facility-administered medications for this visit.    OBJECTIVE: Appearance: alert, well appearing, and in no distress, oriented to person, place, and time and overweight. BP 141/85 mmHg  Pulse 93  Temp(Src) 98 F (36.7 C)  Resp 16  Ht 5' 5"  (1.651 m)  Wt 215 lb (97.523 kg)  BMI 35.78 kg/m2  SpO2 100%  Exam: heart sounds normal rate, regular rhythm, normal S1, S2, no murmurs, rubs, clicks or gallops, no JVD, chest clear, no carotid bruits, feet: warm, good capillary refill, no trophic changes or ulcerative lesions, normal DP and PT pulses, normal monofilament exam and normal sensory exam  ASSESSMENT: Carmen Burns was seen today for follow-up.  Diagnoses and all orders for this visit:  Type 2 diabetes mellitus without complication, with long-term current use of insulin (HCC) -     Glucose (CBG) -     HgB A1c -     Flu Vaccine QUAD 36+ mos PF IM (Fluarix & Fluzone Quad PF) -     sitaGLIPtin-metformin (JANUMET) 50-1000 MG tablet; Take 1 tablet by mouth 2 (two) times daily with a meal. -     Microalbumin, urine Patients diabetes remains uncontrolled as evidence by hemoglobin a1c >8.  Patient has been non-compliant with diet recommendations. Stressed the multiple complications associated with uncontrolled diabetes.  I have increased  her Janumet dose and advised exercise. I will recheck her on next follow up, if no improvement then I will consider adding additional agent.  Essential hypertension -     lisinopril-hydrochlorothiazide (PRINZIDE,ZESTORETIC) 10-12.5 MG tablet; Take 1 tablet by mouth daily. -     Basic Metabolic Panel; Future Patient blood pressure is stable and may continue on current medication.  Education on diet, exercise, and modifiable risk factors discussed. Will obtain appropriate labs as needed. Will follow up in 3-6 months.   HLD (hyperlipidemia) -     Lipid panel; Future Cholesterol is really elevated, no change from 2 years ago. Please go over things that increase cholesterol levels such as breads pasta, rice, butters, fried foods, etc. I have prescribed her Lipitor 20 mg to take every evening with dinner. Please explain that high cholesterol places her at risk for stroke and heart disease  Anemia, unspecified anemia type -     CBC with Differential; Future   Due to language barrier, an interpreter was present during the history-taking and subsequent discussion (and for part of the physical exam) with this patient.  Return in about 1 week (around 03/22/2015) for Lab Visit and 3 mo PCP HTN.  Lance Bosch, NP 03/15/2015 5:59 PM

## 2015-03-16 ENCOUNTER — Other Ambulatory Visit: Payer: Self-pay | Admitting: Pharmacist

## 2015-03-16 LAB — MICROALBUMIN, URINE: MICROALB UR: 0.5 mg/dL

## 2015-03-16 MED ORDER — TRUE METRIX METER W/DEVICE KIT: PACK | Status: DC

## 2015-03-16 MED ORDER — TRUEPLUS LANCETS 28G MISC: Status: AC

## 2015-03-16 MED ORDER — GLUCOSE BLOOD VI STRP: ORAL_STRIP | Status: DC

## 2015-03-22 ENCOUNTER — Ambulatory Visit: Payer: Self-pay | Attending: Internal Medicine

## 2015-03-22 DIAGNOSIS — D649 Anemia, unspecified: Secondary | ICD-10-CM

## 2015-03-22 DIAGNOSIS — E785 Hyperlipidemia, unspecified: Secondary | ICD-10-CM

## 2015-03-22 DIAGNOSIS — I10 Essential (primary) hypertension: Secondary | ICD-10-CM

## 2015-03-22 LAB — CBC WITH DIFFERENTIAL/PLATELET
BASOS ABS: 0 10*3/uL (ref 0.0–0.1)
Basophils Relative: 0 % (ref 0–1)
EOS PCT: 3 % (ref 0–5)
Eosinophils Absolute: 0.3 10*3/uL (ref 0.0–0.7)
HEMATOCRIT: 41.9 % (ref 36.0–46.0)
Hemoglobin: 13.8 g/dL (ref 12.0–15.0)
LYMPHS ABS: 2.7 10*3/uL (ref 0.7–4.0)
LYMPHS PCT: 32 % (ref 12–46)
MCH: 29.9 pg (ref 26.0–34.0)
MCHC: 32.9 g/dL (ref 30.0–36.0)
MCV: 90.9 fL (ref 78.0–100.0)
MONOS PCT: 7 % (ref 3–12)
MPV: 11.1 fL (ref 8.6–12.4)
Monocytes Absolute: 0.6 10*3/uL (ref 0.1–1.0)
NEUTROS PCT: 58 % (ref 43–77)
Neutro Abs: 4.9 10*3/uL (ref 1.7–7.7)
Platelets: 286 10*3/uL (ref 150–400)
RBC: 4.61 MIL/uL (ref 3.87–5.11)
RDW: 13.1 % (ref 11.5–15.5)
WBC: 8.4 10*3/uL (ref 4.0–10.5)

## 2015-03-22 LAB — BASIC METABOLIC PANEL
BUN: 11 mg/dL (ref 7–25)
CHLORIDE: 101 mmol/L (ref 98–110)
CO2: 23 mmol/L (ref 20–31)
Calcium: 9.5 mg/dL (ref 8.6–10.4)
Creat: 0.71 mg/dL (ref 0.50–1.05)
Glucose, Bld: 213 mg/dL — ABNORMAL HIGH (ref 65–99)
POTASSIUM: 4.3 mmol/L (ref 3.5–5.3)
Sodium: 137 mmol/L (ref 135–146)

## 2015-03-22 LAB — LIPID PANEL
Cholesterol: 206 mg/dL — ABNORMAL HIGH (ref 125–200)
HDL: 51 mg/dL (ref >=46)
LDL Cholesterol: 117 mg/dL (ref <130)
Total CHOL/HDL Ratio: 4 Ratio (ref <=5.0)
Triglycerides: 188 mg/dL — ABNORMAL HIGH (ref <150)
VLDL: 38 mg/dL — AB (ref <30)

## 2015-03-28 ENCOUNTER — Telehealth: Payer: Self-pay

## 2015-03-28 ENCOUNTER — Telehealth: Payer: Self-pay | Admitting: Internal Medicine

## 2015-03-28 DIAGNOSIS — E7800 Pure hypercholesterolemia, unspecified: Secondary | ICD-10-CM

## 2015-03-28 MED ORDER — ATORVASTATIN CALCIUM 20 MG PO TABS: 20.0000 mg | ORAL_TABLET | Freq: Every day | ORAL | Status: DC

## 2015-03-28 NOTE — Telephone Encounter (Signed)
Patient returned nurse phone call. °Please follow up. °

## 2015-03-28 NOTE — Telephone Encounter (Signed)
Interpreter line used Cori Razor ID# [id number] Returned patient phone call and patient is aware of her lab results And to pick up her prescription for lipitor at the pharmacy

## 2015-03-28 NOTE — Telephone Encounter (Signed)
Interpreter line used Freida Busman ID# [phone] Patient not available Message left on voice mail to return our call

## 2015-03-28 NOTE — Telephone Encounter (Signed)
-----   Message from Lance Bosch, NP sent at 03/24/2015  1:59 PM EST ----- Cholesterol is really elevated. Please go over things that increase cholesterol levels such as breads pasta, rice, butters, fried foods, etc. Please send Lipitor 20 mg to take every evening with dinner. Please explain that high cholesterol places her at risk for stroke and heart disease

## 2015-04-27 ENCOUNTER — Other Ambulatory Visit: Payer: Self-pay | Admitting: Internal Medicine

## 2015-07-26 ENCOUNTER — Ambulatory Visit: Payer: Self-pay | Attending: Family Medicine | Admitting: Family Medicine

## 2015-07-26 ENCOUNTER — Encounter: Payer: Self-pay | Admitting: Family Medicine

## 2015-07-26 VITALS — BP 137/92 | HR 84 | Temp 97.9°F | Resp 16 | Ht 65.0 in | Wt 211.0 lb

## 2015-07-26 DIAGNOSIS — E118 Type 2 diabetes mellitus with unspecified complications: Secondary | ICD-10-CM

## 2015-07-26 DIAGNOSIS — E1140 Type 2 diabetes mellitus with diabetic neuropathy, unspecified: Secondary | ICD-10-CM | POA: Insufficient documentation

## 2015-07-26 DIAGNOSIS — M62838 Other muscle spasm: Secondary | ICD-10-CM

## 2015-07-26 DIAGNOSIS — Z79899 Other long term (current) drug therapy: Secondary | ICD-10-CM | POA: Insufficient documentation

## 2015-07-26 DIAGNOSIS — E1149 Type 2 diabetes mellitus with other diabetic neurological complication: Secondary | ICD-10-CM

## 2015-07-26 DIAGNOSIS — I10 Essential (primary) hypertension: Secondary | ICD-10-CM

## 2015-07-26 DIAGNOSIS — E785 Hyperlipidemia, unspecified: Secondary | ICD-10-CM

## 2015-07-26 DIAGNOSIS — Z1211 Encounter for screening for malignant neoplasm of colon: Secondary | ICD-10-CM

## 2015-07-26 LAB — GLUCOSE, POCT (MANUAL RESULT ENTRY): POC Glucose: 179 mg/dl — AB (ref 70–99)

## 2015-07-26 LAB — POCT GLYCOSYLATED HEMOGLOBIN (HGB A1C): HEMOGLOBIN A1C: 8.3

## 2015-07-26 MED ORDER — ATORVASTATIN CALCIUM 20 MG PO TABS: 20.0000 mg | ORAL_TABLET | Freq: Every day | ORAL | Status: DC

## 2015-07-26 MED ORDER — GLIPIZIDE 5 MG PO TABS: 2.5000 mg | ORAL_TABLET | Freq: Two times a day (BID) | ORAL | Status: DC

## 2015-07-26 MED ORDER — CYCLOBENZAPRINE HCL 10 MG PO TABS: 10.0000 mg | ORAL_TABLET | Freq: Three times a day (TID) | ORAL | Status: DC | PRN

## 2015-07-26 MED ORDER — SITAGLIPTIN PHOS-METFORMIN HCL 50-1000 MG PO TABS: 1.0000 | ORAL_TABLET | Freq: Two times a day (BID) | ORAL | Status: DC

## 2015-07-26 MED ORDER — LISINOPRIL-HYDROCHLOROTHIAZIDE 10-12.5 MG PO TABS: 1.0000 | ORAL_TABLET | Freq: Every day | ORAL | Status: DC

## 2015-07-26 MED ORDER — GABAPENTIN 300 MG PO CAPS: 300.0000 mg | ORAL_CAPSULE | Freq: Every day | ORAL | Status: DC

## 2015-07-26 NOTE — Progress Notes (Signed)
Subjective:  Patient ID: Carmen Burns, female    DOB: 08/07/1960  Age: 55 y.o. MRN: [id number]  CC: Follow-up and Diabetes   HPI Leontina Skidmore is a 55 year old female with a history of hypertension, type 2 diabetes mellitus (A1c 8.3 from today), hyperlipidemia, obesity who comes into the clinic for a follow-up visit and to establish care with me (that she was previously followed by Dr. Annitta Needs).  She endorses compliance with her diabetic medications, antihypertensives and statin and informs me her blood sugars have been in the 137 range fasting. She denies hypoglycemia, polyuria, visual symptoms but endorses numbness in her hands.  She complains of pain in her calf which is worse at nighttime but denies claudication pain; denies history of smoking. Would like to have a referral for colonoscopy; had mammogram and Pap smear last year at the [facility].  Outpatient Prescriptions Prior to Visit  Medication Sig Dispense Refill  . Blood Glucose Monitoring Suppl (TRUE METRIX METER) W/DEVICE KIT USE TO CHECK BLOOD SUGAR THREE TIMES DAILY AND AT BEDTIME 1 kit 0  . glucose blood (TRUE METRIX BLOOD GLUCOSE TEST) test strip USE TO CHECK BLOOD SUGAR THREE TIMES DAILY AND AT BEDTIME 100 each 12  . naproxen sodium (ANAPROX) 550 MG tablet Take 1 tablet (550 mg total) by mouth 2 (two) times daily with a meal. For pain 30 tablet 2  . TRUEPLUS LANCETS 28G MISC USE TO CHECK BLOOD SUGAR THREE TIMES DAILY AND AT BEDTIME 100 each 2  . atorvastatin (LIPITOR) 20 MG tablet Take 1 tablet (20 mg total) by mouth daily. 90 tablet 3  . lisinopril-hydrochlorothiazide (PRINZIDE,ZESTORETIC) 10-12.5 MG tablet Take 1 tablet by mouth daily. 30 tablet 4  . sitaGLIPtin-metformin (JANUMET) 50-1000 MG tablet Take 1 tablet by mouth 2 (two) times daily with a meal. 30 tablet 4   No facility-administered medications prior to visit.    ROS Review of Systems  Constitutional: Negative for activity change, appetite  change and fatigue.  HENT: Negative for congestion, sinus pressure and sore throat.   Eyes: Negative for visual disturbance.  Respiratory: Negative for cough, chest tightness, shortness of breath and wheezing.   Cardiovascular: Negative for chest pain and palpitations.  Gastrointestinal: Negative for abdominal pain, constipation and abdominal distention.  Endocrine: Negative for polydipsia.  Genitourinary: Negative for dysuria and frequency.  Musculoskeletal:       See hpi  Skin: Negative for rash.  Neurological: Positive for numbness. Negative for tremors and light-headedness.  Hematological: Does not bruise/bleed easily.  Psychiatric/Behavioral: Negative for behavioral problems and agitation.    Objective:  BP 137/92 mmHg  Pulse 84  Temp(Src) 97.9 F (36.6 C) (Oral)  Resp 16  Ht 5' 5"  (1.651 m)  Wt 211 lb (95.709 kg)  BMI 35.11 kg/m2  SpO2 98%  BP/Weight 07/26/2015 97/35/3299 04/28/2681  Systolic BP 419 622 297  Diastolic BP 92 85 87  Wt. (Lbs) 211 215 217  BMI 35.11 35.78 36.11      Physical Exam  Constitutional: She is oriented to person, place, and time. She appears well-developed and well-nourished.  Cardiovascular: Normal rate, normal heart sounds and intact distal pulses.   No murmur heard. Pulmonary/Chest: Effort normal and breath sounds normal. She has no wheezes. She has no rales. She exhibits no tenderness.  Abdominal: Soft. Bowel sounds are normal. She exhibits no distension and no mass. There is no tenderness.  Musculoskeletal: Normal range of motion.  Neurological: She is alert and oriented to person, place, and time.  Skin: Skin is warm and dry.  Psychiatric: She has a normal mood and affect.     Assessment & Plan:   1. Type 2 diabetes mellitus with complication, without long-term current use of insulin (HCC) Uncontrolled with A1c of 8.3 Glipizide added to regimen and will review blood sugar log at next visit Foot exam performed today, Pneumovax at  next office visit. Keep blood sugar logs with fasting goals of 80-120 mg/dl, random of less than 180 and in the event of sugars less than 60 mg/dl or greater than 400 mg/dl please notify the clinic ASAP. It is recommended that you undergo annual eye exams and annual foot exams. Pneumovax is recommended every 5 years before the age of 67 and once for a lifetime at or after the age of 57. - Glucose (CBG) - POCT A1C - Microalbumin/Creatinine Ratio, Urine - COMPLETE METABOLIC PANEL WITH GFR; Future - Lipid panel; Future - glipiZIDE (GLUCOTROL) 5 MG tablet; Take 0.5 tablets (2.5 mg total) by mouth 2 (two) times daily before a meal.  Dispense: 30 tablet; Refill: 3 - sitaGLIPtin-metformin (JANUMET) 50-1000 MG tablet; Take 1 tablet by mouth 2 (two) times daily with a meal.  Dispense: 30 tablet; Refill: 4  2. Essential hypertension Mild diastolic elevation Low-sodium, DASH diet, lifestyle modifications and weight loss - lisinopril-hydrochlorothiazide (PRINZIDE,ZESTORETIC) 10-12.5 MG tablet; Take 1 tablet by mouth daily.  Dispense: 30 tablet; Refill: 4  3. Hyperlipidemia Lipid panel ordered to assess control - atorvastatin (LIPITOR) 20 MG tablet; Take 1 tablet (20 mg total) by mouth daily.  Dispense: 30 tablet; Refill: 3  4. Other diabetic neurological complication associated with type 2 diabetes mellitus ([place]) Commenced on gabapentin-side effects discussed - gabapentin (NEURONTIN) 300 MG capsule; Take 1 capsule (300 mg total) by mouth at bedtime.  Dispense: 30 capsule; Refill: 3  5. Muscle spasm - cyclobenzaprine (FLEXERIL) 10 MG tablet; Take 1 tablet (10 mg total) by mouth 3 (three) times daily as needed for muscle spasms.  Dispense: 30 tablet; Refill: 0  6. Special screening for malignant neoplasms, colon - Ambulatory referral to Gastroenterology  Healthcare maintenance Up-to-date on Pap smear and mammogram as per patient  Meds ordered this encounter  Medications  . gabapentin  (NEURONTIN) 300 MG capsule    Sig: Take 1 capsule (300 mg total) by mouth at bedtime.    Dispense:  30 capsule    Refill:  3  . cyclobenzaprine (FLEXERIL) 10 MG tablet    Sig: Take 1 tablet (10 mg total) by mouth 3 (three) times daily as needed for muscle spasms.    Dispense:  30 tablet    Refill:  0  . glipiZIDE (GLUCOTROL) 5 MG tablet    Sig: Take 0.5 tablets (2.5 mg total) by mouth 2 (two) times daily before a meal.    Dispense:  30 tablet    Refill:  3  . sitaGLIPtin-metformin (JANUMET) 50-1000 MG tablet    Sig: Take 1 tablet by mouth 2 (two) times daily with a meal.    Dispense:  30 tablet    Refill:  4    New dose  . lisinopril-hydrochlorothiazide (PRINZIDE,ZESTORETIC) 10-12.5 MG tablet    Sig: Take 1 tablet by mouth daily.    Dispense:  30 tablet    Refill:  4  . atorvastatin (LIPITOR) 20 MG tablet    Sig: Take 1 tablet (20 mg total) by mouth daily.    Dispense:  30 tablet    Refill:  3    Follow-up:  Return in about 3 weeks (around 08/16/2015) for Follow-up on diabetes mellitus.   Arnoldo Morale MD

## 2015-07-26 NOTE — Patient Instructions (Signed)
Neuropata diabtica (Diabetic Neuropathy) La neuropata diabtica es una enfermedad o lesin nerviosa causada por la diabetes mellitus. Alrededor de la mitad de todas las personas que sufren diabetes mellitus tienen alguna forma de lesin nerviosa. Es ms frecuente en aquellas personas que han sufrido diabetes mellitus durante muchos aos y quienes generalmente no tienen buen control del nivel de azcar en la sangre (glucosa). La neuropata diabtica es una complicacin comn de la diabetes mellitus. Hay tres tipos ms comunes de neuropata diiabtica y un cuarto tipo que es menos frecuente y menos comprendido:   Neuropata perifrica: Es el tipo ms frecuente de neuropata diabtica. Causa lesiones en los nervios de los pies y las piernas primero y finalmente en las manos y los brazos.La lesin afecta la capacidad de sentir con el tacto.  Neuropata autonmica: este tipo causa lesiones en el sistema nervioso autnomo, que controla las siguientes funciones:  Los latidos cardacos.  La temperatura corporal.  La presin arterial.  La miccin.  La digestin.  La sudoracin.  La funcin sexual.  Neuropata focal: la neuropata focal puede ser dolorosa e impredecible y ocurre con ms frecuencia en adultos mayores con diabetes mellitus. Implica un nervio especfico o un rea y con frecuencia aparece repentinamente. Generalmente no causa problemas a largo plazo.  Neuropata diabtica proximal: tambin llamada neuropata lumbosacra, afecta los nervios de los muslos, las caderas, las nalgas o las piernas. Es ms frecuente en personas con diabetes mellitus tipo 2 y en hombre mayores. Se caracteriza por dolor debilitante, debilidad y atrofia, generalmente en los msculos de los muslos. CAUSAS  La causa de la neuropata perifrica, autonmica y focal es la diabetes mellitus no controlada y los niveles elevados de glucosa. La causa de la neuropata diabtica proximal no se conoce. Sin embargo, se  considera que la causa es una inflamacin relacionada con los niveles no controlados de glucosa. SIGNOS Y SNTOMAS  Neuropata perifrica La neuropata perifrica se desarrolla lentamente a lo largo del tiempo. Cuando los nervios de los pies y las piernas ya no funcionan puede haber:   Ardor, sensacin pulstil o dolor en las piernas o los pies.  Incapacidad para sentir presin o dolor en el pie. Las consecuencias son:  Callosidades gruesas en las zonas de presin.  Escaras.  lceras.  Deformidades en el pie.  Capacidad reducida para sentir los cambios de temperatura.  Debilidad muscular. Neuropata autnoma Los sntomas varan segn qu nervios estn afectados. Los sntomas pueden ser:  Problemas digestivos como:  Ganas de vomitar (nuseas).  Vmitos.  Hinchazn.  Estreimiento.  Diarrea.  Dolor abdominal.  Dificultad para orinar. Esto ocurre cuando se pierde la capacidad de sentir cuando la vejiga est llena. Los problemas incluyen:  Prdida de orina (incontinencia).  Imposibilidad de vaciar la vejiga completamente (retencin).  Latidos cardacos rpidos o irregulares (palpitaciones).  Descenso brusco de la presin arterial al ponerse de pie (hipotensin ortosttica). Al ponerse de pie puede sentir:  Mareos.  Debilidad.  Desmayos.  En los hombres, imposibilidad de lograr y mantener una ereccin.  En las mujeres, sequedad vaginal y problemas de disminucin del deseo sexual y la excitacin.  Problemas con la regulacin de la temperatura corporal.  Aumento o disminucin de la sudoracin. Neuropata focal  Movimientos oculares anormales o alineacin ocular anormal de ambos ojos.  Debilidad en la mueca.  Pie cado. Esto da como resultado una incapacidad para levantar el pie adecuadamente y una marcha o movimientos del pie anormales.  Parlisis de un lado del rostro (parlisis de [place]).    Dolor en el pecho o dolor abdominal. Neuropata radicular  plexual  Dolor repentino e intenso en la cadera, el muslo o las nalgas.  Debilidad y desgaste de los msculos del muslo.  Dificultad para levantarse desde una posicin de sentado.  Hinchazn abdominal.  Prdida de peso sin motivo (generalmente ms de 10 lb [4,5 kg]). DIAGNSTICO  Neuropata perifrica Le controlarn los sentidos. Las pruebas de funcin sensorial pueden realizarse con:  Un ligero toque usando un monofilamento.  La vibracin con un diapasn.  Sensacin aguda con el pinchazo de un alfiler. Otras pruebas que ayudan a diagnosticar la neuropata son:  Velocidad de conduccin nerviosa. Este estudio controla la transmisin de la corriente elctrica a travs del nervio.  Electromiograma. Muestra de qu modo los msculos responden a las seales elctricas transmitidas por los nervios que los rodean.  Prueba de sensibilidad cuantitativa. Se utiliza para evaluar de que modo los nervios responden a las vibraciones y cambios de temperatura. Neuropata autnoma El diagnstico se basa en los sntomas informados. infrmele al mdico o al profesional de la salud si siente:   Mareos.   Estreimiento.   Diarrea.   Dificultad o imposibilidad de orinar.   Imposibilidad de lograr o mantener una ereccin.  Podrn solicitarle otros estudios, por ejemplo:   Electrocardiograma o monitoreo Holter. Estos estudios muestran si hay problemas con la frecuencia y el ritmo cardacos.   Le harn radiografas. Neuropata focal El diagnstico se basa en los sntomas y en lo que el mdico encuentre durante el examen. Podrn solicitarle otros estudios. Pueden incluir:  Velocidad de conduccin nerviosa. Este estudio controla la transmisin de la corriente elctrica a travs del nervio.  Electromiograma. Muestra de qu modo los msculos responden a las seales elctricas transmitidas por los nervios que los rodean.  Prueba de sensibilidad cuantitativa. Se utiliza para evaluar de que  modo los nervios responden a las vibraciones y cambios de temperatura. Neuropataradicular plexual  Generalmente lo primero es eliminar cualquier otro motivo o problemas que pudieran ser la causa, ya que no hay exmenes para realizar este diagnstico.  Radiografa de la columna vertebral y la regin lumbar.  Puncin lumbar para descartar cncer.  Resonancia magntica para descartar otras lesiones. TRATAMIENTO  Una vez que se produce una lesin nerviosa, no puede ser revertida. El objetivo del tratamiento es impedir que la enfermedad o la lesin nerviosa empeoren y afecten ms nervios. Controlar el nivel de glucosa en la sangre es la clave. La mayora de las personas con neuropata radicular plexual ven al menos una mejora parcial con el tiempo. Deber mantener su nivel de glucosa en sangre y el nivel HbA1c en los valores determinados por su mdico. Los factores que ayudan a controlar el nivel de glucosa en sangre son:   Control del nivel de glucosa en sangre.   Planificacin de los alimentos.   Actividad fsica.   Medicamentos para la diabetes.  Con el tiempo, el mantener bajos los niveles de glucosa en la sangre ayuda a disminuir los sntomas. En algunos casos, se indican analgsicos. INSTRUCCIONES PARA EL CUIDADO EN EL HOGAR:  No fume.  Mantenga su nivel de glucosa en sangre en el rango en el que usted y su mdico han determinado que es aceptable para usted.  Mantenga su nivel de glucosa en la sangre en el rango en el que usted y su mdico han determinado que es aceptable para usted.  Consuma una dieta bien balanceada.  Haga actividad fsica todos los das. Incluya ejercicios de equilibrio y   entrenamiento de la fuerza.  Protjase los pies.  Revise sus pies todos los das para detectar llagas, cortaduras, ampollas o signos de infeccin.  Use medias acolchadas y zapatos con buen apoyo. Si es necesario, use dispositivos ortopdicos.  Revise con regularidad la parte de  adentro de los zapatos para ver si hay puntos desgastados. Asegrese de que no haya piedras u otros elementos adentro de los zapatos antes de ponrselos. SOLICITE ATENCIN MDICA SI:   Siente ardor, sensacin pulstil o dolor en las piernas o los pies.  Tiene incapacidad para sentir presin o dolor en el pie.  Aparecen problemas digestivos como:  Nuseas.  Vmitos.  Hinchazn.  Estreimiento.  Diarrea.  Dolor abdominal.  Tiene dificultad para orinar como:  incontinencia.  Retencin.  Usted tiene palpitaciones.  Presenta hipotensin ortosttica. Al ponerse de pie puede sentir:  Mareos.  Debilidad.  Desmayarse  No puede lograr tener ni mantener una ereccin (en los hombres).  En las mujeres, sequedad vaginal y problemas de disminucin del deseo sexual y la excitacin.  Siente un dolor intenso en los muslos, las piernas o las nalgas.  Pierde peso de manera inexplicable.   Esta informacin no tiene como fin reemplazar el consejo del mdico. Asegrese de hacerle al mdico cualquier pregunta que tenga.   Document Released: 03/11/2005 Document Revised: 04/01/2014 Elsevier Interactive Patient Education 2016 [employer]  

## 2015-07-26 NOTE — Progress Notes (Signed)
Patient's here for f/up DM. Patient to est care with PCP.  Patient denies any pain today.  Patient requesting a referral for colonoscopy.

## 2015-07-27 LAB — MICROALBUMIN / CREATININE URINE RATIO
Creatinine, Urine: 77 mg/dL (ref 20–320)
MICROALB UR: 0.4 mg/dL
MICROALB/CREAT RATIO: 5 ug/mg{creat} (ref <30)

## 2015-07-31 ENCOUNTER — Other Ambulatory Visit: Payer: Self-pay

## 2015-07-31 ENCOUNTER — Ambulatory Visit: Payer: Self-pay | Attending: Family Medicine

## 2015-07-31 DIAGNOSIS — E785 Hyperlipidemia, unspecified: Secondary | ICD-10-CM

## 2015-07-31 DIAGNOSIS — E118 Type 2 diabetes mellitus with unspecified complications: Secondary | ICD-10-CM | POA: Insufficient documentation

## 2015-07-31 NOTE — Progress Notes (Signed)
Patient's here for lab visit only. 

## 2015-07-31 NOTE — Patient Instructions (Signed)
Patient will receive a call once lab results are in.

## 2015-08-02 ENCOUNTER — Encounter: Payer: Self-pay | Admitting: Gastroenterology

## 2015-08-16 ENCOUNTER — Encounter: Payer: Self-pay | Admitting: Family Medicine

## 2015-08-16 ENCOUNTER — Ambulatory Visit: Payer: Self-pay | Attending: Family Medicine | Admitting: Family Medicine

## 2015-08-16 VITALS — BP 136/85 | HR 98 | Temp 98.2°F | Resp 14 | Ht 64.0 in | Wt 208.0 lb

## 2015-08-16 DIAGNOSIS — E118 Type 2 diabetes mellitus with unspecified complications: Secondary | ICD-10-CM

## 2015-08-16 LAB — GLUCOSE, POCT (MANUAL RESULT ENTRY): POC Glucose: 112 mg/dl — AB (ref 70–99)

## 2015-08-16 NOTE — Progress Notes (Signed)
CC: Follow-up on diabetes mellitus  HPI: Carmen Burns is a 55 y.o. female with a medical history of type 2 diabetes mellitus (A1c 8.3  From 07/2015), hypertension, hyperlipidemia here today for a follow up visit.  Glipizide was added to her regimen at her last office visit and blood sugar log reviewed reveal fasting blood sugars in the 111-133 range. She initially had some tremors when she started glipizide even though blood sugar values were not in the hypoglycemic range  but those symptoms have since resolved. She walks 1 hour daily.  Patient has No headache, No chest pain, No abdominal pain - No Nausea, No new weakness tingling or numbness, No Cough - SOB.  Allergies  Allergen Reactions  . Penicillins Rash   Past Medical History  Diagnosis Date  . Hypertension   . Hyperlipidemia   . Diabetes mellitus without complication ([place])   . Anemia 2013  . Refusal of blood transfusions as patient is Jehovah's Witness    Current Outpatient Prescriptions on File Prior to Visit  Medication Sig Dispense Refill  . atorvastatin (LIPITOR) 20 MG tablet Take 1 tablet (20 mg total) by mouth daily. 30 tablet 3  . Blood Glucose Monitoring Suppl (TRUE METRIX METER) W/DEVICE KIT USE TO CHECK BLOOD SUGAR THREE TIMES DAILY AND AT BEDTIME 1 kit 0  . cyclobenzaprine (FLEXERIL) 10 MG tablet Take 1 tablet (10 mg total) by mouth 3 (three) times daily as needed for muscle spasms. 30 tablet 0  . gabapentin (NEURONTIN) 300 MG capsule Take 1 capsule (300 mg total) by mouth at bedtime. 30 capsule 3  . glipiZIDE (GLUCOTROL) 5 MG tablet Take 0.5 tablets (2.5 mg total) by mouth 2 (two) times daily before a meal. 30 tablet 3  . glucose blood (TRUE METRIX BLOOD GLUCOSE TEST) test strip USE TO CHECK BLOOD SUGAR THREE TIMES DAILY AND AT BEDTIME 100 each 12  . lisinopril-hydrochlorothiazide (PRINZIDE,ZESTORETIC) 10-12.5 MG tablet Take 1 tablet by mouth daily. 30 tablet 4  . naproxen sodium (ANAPROX) 550 MG tablet Take 1  tablet (550 mg total) by mouth 2 (two) times daily with a meal. For pain 30 tablet 2  . sitaGLIPtin-metformin (JANUMET) 50-1000 MG tablet Take 1 tablet by mouth 2 (two) times daily with a meal. 30 tablet 4  . TRUEPLUS LANCETS 28G MISC USE TO CHECK BLOOD SUGAR THREE TIMES DAILY AND AT BEDTIME 100 each 2   No current facility-administered medications on file prior to visit.   Family History  Problem Relation Age of Onset  . Diabetes Brother   . Hypertension Brother   . Colon cancer Neg Hx    Social History   Social History  . Marital Status: Married    Spouse Name: N/A  . Number of Children: N/A  . Years of Education: N/A   Occupational History  . Not on file.   Social History Main Topics  . Smoking status: Never Smoker   . Smokeless tobacco: Never Used  . Alcohol Use: No  . Drug Use: No  . Sexual Activity: Yes    Birth Control/ Protection: Pill   Other Topics Concern  . Not on file   Social History Narrative    Review of Systems: Constitutional: Negative for fever, chills, diaphoresis, activity change, appetite change and fatigue. HENT: Negative for ear pain, nosebleeds, congestion, facial swelling, rhinorrhea, neck pain, neck stiffness and ear discharge.  Eyes: Negative for pain, discharge, redness, itching and visual disturbance. Respiratory: Negative for cough, choking, chest tightness, shortness of  breath, wheezing and stridor.  Cardiovascular: Negative for chest pain, palpitations and leg swelling. Gastrointestinal: Negative for abdominal distention. Genitourinary: Negative for dysuria, urgency, frequency, hematuria, flank pain, decreased urine volume, difficulty urinating and dyspareunia.  Musculoskeletal: Negative for back pain, joint swelling, arthralgias and gait problem. Neurological: Negative for dizziness, tremors, seizures, syncope, facial asymmetry, speech difficulty, weakness, light-headedness, numbness and headaches.  Hematological: Negative for  adenopathy. Does not bruise/bleed easily. Psychiatric/Behavioral: Negative for hallucinations, behavioral problems, confusion, dysphoric mood, decreased concentration and agitation.    Objective:   Filed Vitals:   08/16/15 1521  BP: 136/85  Pulse: 98  Temp: 98.2 F (36.8 C)  Resp: 14    Physical Exam: Constitutional: Patient appears well-developed and well-nourished. No distress. Neck: Normal ROM. Neck supple. No JVD. No tracheal deviation. No thyromegaly. CVS: RRR, S1/S2 +, no murmurs, no gallops, no carotid bruit.  Pulmonary: Effort and breath sounds normal, no stridor, rhonchi, wheezes, rales.  Abdominal: Soft. BS +,  no distension, tenderness, rebound or guarding.  Musculoskeletal: Normal range of motion. No edema and no tenderness.  Lymphadenopathy: No lymphadenopathy noted, cervical, inguinal or axillary Neuro: Alert. Normal reflexes, muscle tone coordination. No cranial nerve deficit. Skin: Skin is warm and dry. No rash noted. Not diaphoretic. No erythema. No pallor. Psychiatric: Normal mood and affect. Behavior, judgment, thought content normal.  Lab Results  Component Value Date   WBC 8.4 03/22/2015   HGB 13.8 03/22/2015   HCT 41.9 03/22/2015   MCV 90.9 03/22/2015   PLT 286 03/22/2015   Lab Results  Component Value Date   CREATININE 0.71 03/22/2015   BUN 11 03/22/2015   NA 137 03/22/2015   K 4.3 03/22/2015   CL 101 03/22/2015   CO2 23 03/22/2015    Lab Results  Component Value Date   HGBA1C 8.3 07/26/2015   Lipid Panel     Component Value Date/Time   CHOL 206* 03/22/2015 0918   TRIG 188* 03/22/2015 0918   HDL 51 03/22/2015 0918   CHOLHDL 4.0 03/22/2015 0918   VLDL 38* 03/22/2015 0918   LDLCALC 117 03/22/2015 0918       Assessment and plan:   Type 2 diabetes mellitus  A1c of 8.3 from 07/26/15 Blood sugar log reveals improvement with addition of glipizide and so I will make no changes today. Pneumovax and fasting labs at next office  visit.  This note has been created with Surveyor, quantity. Any transcriptional errors are unintentional.         Arnoldo Morale, MD. [facility] [phone] 08/16/2015, 3:46 PM

## 2015-08-16 NOTE — Progress Notes (Signed)
Pt here for F/U for DM. CBG is 112. Pt has taken her medications today and does not need any refills at this moment.

## 2015-08-16 NOTE — Patient Instructions (Signed)
La diabetes mellitus y los alimentos (Diabetes Mellitus and Food) Es importante que controle su nivel de azcar en la sangre (glucosa). El nivel de glucosa en sangre depende en gran medida de lo que usted come. Comer alimentos saludables en las cantidades [place] a lo largo del Training and development officer, aproximadamente a la misma hora [employer], lo ayudar a Chief Technology Officer su nivel de Multimedia programmer. Tambin puede ayudarlo a retrasar o Patent attorney de la diabetes mellitus. Comer de [employer] saludable incluso puede ayudarlo a Chartered loss adjuster de presin arterial y a Science writer o Theatre manager un peso saludable.  Entre las recomendaciones generales para alimentarse y Audiological scientist los alimentos de forma saludable, se incluyen las siguientes:  Respetar las comidas principales y comer colaciones con regularidad. Evitar pasar largos perodos sin comer con el fin de perder peso.  Seguir una dieta que consista principalmente en alimentos de origen vegetal, como frutas, vegetales, frutos secos, legumbres y cereales integrales.  Utilizar mtodos de coccin a baja temperatura, como hornear, en lugar de mtodos de coccin a alta temperatura, como frer en abundante aceite. Trabaje con el nutricionista para aprender a Financial planner nutricional de las etiquetas de los alimentos. CMO PUEDEN AFECTARME LOS ALIMENTOS? Carbohidratos Los carbohidratos afectan el nivel de glucosa en sangre ms que cualquier otro tipo de alimento. El nutricionista lo ayudar a Teacher, adult education cuntos carbohidratos puede consumir en cada comida y ensearle a contarlos. El recuento de carbohidratos es importante para mantener la glucosa en sangre en un nivel saludable, en especial si utiliza insulina o toma determinados medicamentos para la diabetes mellitus. Alcohol El alcohol puede provocar disminuciones sbitas de la glucosa en sangre (hipoglucemia), en especial si utiliza insulina o toma determinados medicamentos para la diabetes mellitus. La  hipoglucemia es una afeccin que puede poner en peligro la vida. Los sntomas de la hipoglucemia (somnolencia, mareos y Data processing manager) son similares a los sntomas de haber consumido mucho alcohol.  Si el mdico lo autoriza a beber alcohol, hgalo con moderacin y siga estas pautas:  Las mujeres no deben beber ms de un trago por da, y los hombres no deben beber ms de dos tragos por Training and development officer. Un trago es igual a:  12 onzas (355 ml) de cerveza  5 onzas de vino (150 ml) de vino  1,5onzas (23m) de bebidas espirituosas  No beba con el estmago vaco.  Mantngase hidratado. Beba agua, gaseosas dietticas o t helado sin azcar.  Las gaseosas comunes, los jugos y otros refrescos podran contener muchos carbohidratos y se dCivil Service fast streamer QU ALIMENTOS NO SE RECOMIENDAN? Cuando haga las elecciones de alimentos, es importante que recuerde que todos los alimentos son distintos. Algunos tienen menos nutrientes que otros por porcin, aunque podran tener la misma cantidad de caloras o carbohidratos. Es difcil darle al cuerpo lo que necesita cuando consume alimentos con menos nutrientes. Estos son algunos ejemplos de alimentos que debera evitar ya que contienen muchas caloras y carbohidratos, pero pocos nutrientes:  GPhysicist, medicaltrans (la mayora de los alimentos procesados incluyen grasas trans en la etiqueta de Informacin nutricional).  Gaseosas comunes.  Jugos.  Caramelos.  Dulces, como tortas, pasteles, rosquillas y [place]  Comidas fritas. QU ALIMENTOS PUEDO COMER? Consuma alimentos ricos en nutrientes, que nutrirn el cuerpo y lo mantendrn saludable. Los alimentos que debe comer tambin dependern de varios factores, como:  Las caloras que necesita.  Los medicamentos que toma.  Su peso.  El nivel de glucosa en sMarist [employer]  El nArrow Rockde presin arterial.  El nivel de colesterol.  Debe consumir una amplia variedad de alimentos, por ejemplo:  Protenas.  Cortes de carne  magros.  Protenas con bajo contenido de grasas saturadas, como pescado, clara de huevo y frijoles. Evite las carnes procesadas.  Frutas y vegetales.  Frutas y vegetales que pueden ayudar a controlar los niveles sanguneos de glucosa, como manzanas, mangos y batatas.  Productos lcteos.  Elija productos lcteos sin grasa o con bajo contenido de grasa, como leche, yogur y queso.  Cereales, panes, pastas y arroz.  Elija cereales integrales, como panes multicereales, avena en grano y arroz integral. Estos alimentos pueden ayudar a controlar la presin arterial.  Grasas.  Alimentos que contengan grasas saludables, como frutos secos, aguacate, aceite de oliva, aceite de canola y pescado. TODOS LOS QUE PADECEN DIABETES MELLITUS TIENEN EL MISMO PLAN DE COMIDAS? Dado que todas las personas que padecen diabetes mellitus son distintas, no hay un solo plan de comidas que funcione para todos. Es muy importante que se rena con un nutricionista que lo ayudar a crear un plan de comidas adecuado para usted.   Esta informacin no tiene como fin reemplazar el consejo del mdico. Asegrese de hacerle al mdico cualquier pregunta que tenga.   Document Released: 06/18/2007 Document Revised: 04/01/2014 Elsevier Interactive Patient Education 2016 Elsevier Inc.  

## 2015-09-06 ENCOUNTER — Ambulatory Visit (AMBULATORY_SURGERY_CENTER): Payer: Self-pay | Admitting: *Deleted

## 2015-09-06 VITALS — Ht 64.0 in | Wt 206.0 lb

## 2015-09-06 DIAGNOSIS — Z1211 Encounter for screening for malignant neoplasm of colon: Secondary | ICD-10-CM

## 2015-09-06 MED ORDER — NA SULFATE-K SULFATE-MG SULF 17.5-3.13-1.6 GM/177ML PO SOLN: ORAL | Status: DC

## 2015-09-06 NOTE — Progress Notes (Signed)
Patient denies any allergies to eggs or soy. Patient denies any problems with anesthesia/sedation. Patient denies any oxygen use at home and does not take any diet/weight loss medications. Spanish translator in with patient during pre-visit today. suprep sample given to patient.

## 2015-09-20 ENCOUNTER — Encounter: Payer: Self-pay | Admitting: Gastroenterology

## 2015-09-20 ENCOUNTER — Ambulatory Visit (AMBULATORY_SURGERY_CENTER): Payer: Self-pay | Admitting: Gastroenterology

## 2015-09-20 VITALS — BP 137/78 | HR 67 | Temp 97.1°F | Resp 12 | Ht 64.0 in | Wt 206.0 lb

## 2015-09-20 DIAGNOSIS — Z1211 Encounter for screening for malignant neoplasm of colon: Secondary | ICD-10-CM

## 2015-09-20 DIAGNOSIS — D120 Benign neoplasm of cecum: Secondary | ICD-10-CM

## 2015-09-20 LAB — GLUCOSE, CAPILLARY
Glucose-Capillary: 140 mg/dL — ABNORMAL HIGH (ref 65–99)
Glucose-Capillary: 123 mg/dL — ABNORMAL HIGH (ref 65–99)

## 2015-09-20 MED ORDER — SODIUM CHLORIDE 0.9 % IV SOLN: 500.0000 mL | INTRAVENOUS | Status: DC

## 2015-09-20 NOTE — Patient Instructions (Signed)
Colon polyps removed today. Handouts given on polyps,hemorrhoids.  NO ASPIRIN, IBUPROFEN, NAPROXEN, OR ANY OTHER NON-STEROIDAL ANTI-INFLAMMATORY MEDICATIONS FOR 2 WEEKS!!! Repeat colonoscopy in 3 months. Patient's daughter will call to make that appointment.  Call us with any questions or concerns. Thank you!!   YOU HAD AN ENDOSCOPIC PROCEDURE TODAY AT [facility]:   Refer to the procedure report that was given to you for any specific questions about what was found during the examination.  If the procedure report does not answer your questions, please call your gastroenterologist to clarify.  If you requested that your care partner not be given the details of your procedure findings, then the procedure report has been included in a sealed envelope for you to review at your convenience later.  YOU SHOULD EXPECT: Some feelings of bloating in the abdomen. Passage of more gas than usual.  Walking can help get rid of the air that was put into your GI tract during the procedure and reduce the bloating. If you had a lower endoscopy (such as a colonoscopy or flexible sigmoidoscopy) you may notice spotting of blood in your stool or on the toilet paper. If you underwent a bowel prep for your procedure, you may not have a normal bowel movement for a few days.  Please Note:  You might notice some irritation and congestion in your nose or some drainage.  This is from the oxygen used during your procedure.  There is no need for concern and it should clear up in a day or so.  SYMPTOMS TO REPORT IMMEDIATELY:   Following lower endoscopy (colonoscopy or flexible sigmoidoscopy):  Excessive amounts of blood in the stool  Significant tenderness or worsening of abdominal pains  Swelling of the abdomen that is new, acute  Fever of 100F or higher   For urgent or emergent issues, a gastroenterologist can be reached at any hour by calling [phone].   DIET: Your first meal following the  procedure should be a small meal and then it is ok to progress to your normal diet. Heavy or fried foods are harder to digest and may make you feel nauseous or bloated.  Likewise, meals heavy in dairy and vegetables can increase bloating.  Drink plenty of fluids but you should avoid alcoholic beverages for 24 hours.  ACTIVITY:  You should plan to take it easy for the rest of today and you should NOT DRIVE or use heavy machinery until tomorrow (because of the sedation medicines used during the test).    FOLLOW UP: Our staff will call the number listed on your records the next business day following your procedure to check on you and address any questions or concerns that you may have regarding the information given to you following your procedure. If we do not reach you, we will leave a message.  However, if you are feeling well and you are not experiencing any problems, there is no need to return our call.  We will assume that you have returned to your regular daily activities without incident.  If any biopsies were taken you will be contacted by phone or by letter within the next 1-3 weeks.  Please call us at [phone] if you have not heard about the biopsies in 3 weeks.    SIGNATURES/CONFIDENTIALITY: You and/or your care partner have signed paperwork which will be entered into your electronic medical record.  These signatures attest to the fact that that the information above on your After Visit Summary  has been reviewed and is understood.  Full responsibility of the confidentiality of this discharge information lies with you and/or your care-partner. 

## 2015-09-20 NOTE — Progress Notes (Signed)
Called to room to assist during endoscopic procedure.  Patient ID and intended procedure confirmed with present staff. Received instructions for my participation in the procedure from the performing physician.  

## 2015-09-20 NOTE — Progress Notes (Signed)
Patient assist to bathroom to aide in expelling of air. Continue to complain of abdominal discomfort. levsin given to aide in expelling of air. Dr. Havery Moros in to check patient with orders to continue to assist with expelling of air, if not better in 20 minutes may send for abdominal x-ray.

## 2015-09-20 NOTE — Op Note (Signed)
[facility] Patient Name: Carmen Burns Procedure Date: 09/20/2015 11:48 AM MRN: [id number] Endoscopist: Remo Lipps P. Havery Moros , MD Age: 55 Referring MD:  Date of Birth: 1960/08/03 Gender: Female Account #: [id number] Procedure:                Colonoscopy Indications:              Screening for malignant neoplasm in the colon, This                            is the patient's first colonoscopy Medicines:                Monitored Anesthesia Care Procedure:                Pre-Anesthesia Assessment:                           - Prior to the procedure, a History and Physical                            was performed, and patient medications and                            allergies were reviewed. The patient's tolerance of                            previous anesthesia was also reviewed. The risks                            and benefits of the procedure and the sedation                            options and risks were discussed with the patient.                            All questions were answered, and informed consent                            was obtained. Prior Anticoagulants: The patient has                            taken no previous anticoagulant or antiplatelet                            agents. ASA Grade Assessment: III - A patient with                            severe systemic disease. After reviewing the risks                            and benefits, the patient was deemed in                            satisfactory condition to undergo the procedure.  After obtaining informed consent, the colonoscope                            was passed under direct vision. Throughout the                            procedure, the patient's blood pressure, pulse, and                            oxygen saturations were monitored continuously. The                            Model CF-HQ190L [id number]) scope was introduced                            through  the anus and advanced to the the cecum,                            identified by appendiceal orifice and ileocecal                            valve. The colonoscopy was performed without                            difficulty. The patient tolerated the procedure                            well. The quality of the bowel preparation was                            adequate. The ileocecal valve, appendiceal orifice,                            and rectum were photographed. Scope In: 12:01:09 PM Scope Out: 12:28:36 PM Scope Withdrawal Time: 0 hours 22 minutes 39 seconds  Total Procedure Duration: 0 hours 27 minutes 27 seconds  Findings:                 The perianal and digital rectal examinations were                            normal.                           A 3 mm polyp was found in the cecum. The polyp was                            sessile. The polyp was removed with a cold snare.                            Resection and retrieval were complete.                           A 15 to 17 mm polyp was found in the appendiceal  orifice, and extended down into the AO however the                            border of it was visualized. The polyp was sessile                            in parts but with flat lateral edges. The polyp was                            removed with a piecemeal technique using a cold                            snare. The polypectomy was technically challenging                            as the border was difficult to visualize given its                            location behind a fold, however resection and                            retrieval were thought to have been complete.                           Non-bleeding internal hemorrhoids were found during                            retroflexion. The hemorrhoids were small.                           The exam was otherwise without abnormality. Complications:            No immediate complications.  Estimated blood loss:                            Minimal. Estimated Blood Loss:     Estimated blood loss was minimal. Impression:               - One 3 mm polyp in the cecum, removed with a cold                            snare. Resected and retrieved.                           - One 15 to 17 mm polyp at the appendiceal orifice,                            removed piecemeal using a cold snare. Resected and                            retrieved. Patient has a reported history of an  appendectomy                           - Non-bleeding internal hemorrhoids.                           - The examination was otherwise normal. Recommendation:           - Patient has a contact number available for                            emergencies. The signs and symptoms of potential                            delayed complications were discussed with the                            patient. Return to normal activities tomorrow.                            Written discharge instructions were provided to the                            patient.                           - Resume previous diet.                           - Continue present medications.                           - No aspirin, ibuprofen, naproxen, or other                            non-steroidal anti-inflammatory drugs for 2 weeks                            after polyp removal.                           - Await pathology results.                           - Repeat colonoscopy is recommended for                            surveillance in 3 months. Remo Lipps P. Paolina Karwowski, MD 09/20/2015 12:34:34 PM This report has been signed electronically.

## 2015-09-20 NOTE — Progress Notes (Signed)
Transferred to recovery awake alert vss report to [facility]

## 2015-09-21 ENCOUNTER — Telehealth: Payer: Self-pay | Admitting: *Deleted

## 2015-09-21 NOTE — Telephone Encounter (Signed)
No answer, message left for the patient. 

## 2015-09-28 ENCOUNTER — Encounter: Payer: Self-pay | Admitting: Gastroenterology

## 2015-10-25 ENCOUNTER — Encounter: Payer: Self-pay | Admitting: Gastroenterology

## 2015-11-01 ENCOUNTER — Encounter: Payer: Self-pay | Admitting: *Deleted

## 2015-11-13 ENCOUNTER — Encounter: Payer: Self-pay | Admitting: Gastroenterology

## 2015-11-24 ENCOUNTER — Encounter: Payer: Self-pay | Admitting: Gastroenterology

## 2015-12-04 ENCOUNTER — Telehealth: Payer: Self-pay | Admitting: Family Medicine

## 2015-12-04 DIAGNOSIS — E118 Type 2 diabetes mellitus with unspecified complications: Secondary | ICD-10-CM

## 2015-12-04 DIAGNOSIS — I10 Essential (primary) hypertension: Secondary | ICD-10-CM

## 2015-12-04 MED ORDER — LISINOPRIL-HYDROCHLOROTHIAZIDE 10-12.5 MG PO TABS: 1.0000 | ORAL_TABLET | Freq: Every day | ORAL | 0 refills | Status: DC

## 2015-12-04 MED ORDER — SITAGLIPTIN PHOS-METFORMIN HCL 50-1000 MG PO TABS: 1.0000 | ORAL_TABLET | Freq: Two times a day (BID) | ORAL | 0 refills | Status: DC

## 2015-12-04 NOTE — Telephone Encounter (Signed)
Patient came to the office to request refills for her medications, sitaGLIPtin-metformin (JANUMET) 50-1000 MG tablet and lisinopril. Please follow up.  Thank you

## 2015-12-04 NOTE — Telephone Encounter (Signed)
Requested medications refilled - patient is due for office visit with Dr. Jarold Song

## 2015-12-19 ENCOUNTER — Other Ambulatory Visit: Payer: Self-pay | Admitting: Family Medicine

## 2015-12-19 DIAGNOSIS — Z1231 Encounter for screening mammogram for malignant neoplasm of breast: Secondary | ICD-10-CM

## 2015-12-20 ENCOUNTER — Ambulatory Visit
Admission: RE | Admit: 2015-12-20 | Discharge: 2015-12-20 | Disposition: A | Discharge status: Home / Self Care | Payer: No Typology Code available for payment source | Source: Ambulatory Visit | Attending: Family Medicine | Admitting: Family Medicine

## 2015-12-20 DIAGNOSIS — Z1231 Encounter for screening mammogram for malignant neoplasm of breast: Secondary | ICD-10-CM

## 2015-12-26 ENCOUNTER — Telehealth: Payer: Self-pay | Admitting: Family Medicine

## 2015-12-26 ENCOUNTER — Telehealth: Payer: Self-pay

## 2015-12-26 DIAGNOSIS — I10 Essential (primary) hypertension: Secondary | ICD-10-CM

## 2015-12-26 DIAGNOSIS — E118 Type 2 diabetes mellitus with unspecified complications: Secondary | ICD-10-CM

## 2015-12-26 MED ORDER — SITAGLIPTIN PHOS-METFORMIN HCL 50-1000 MG PO TABS: 1.0000 | ORAL_TABLET | Freq: Two times a day (BID) | ORAL | 0 refills | Status: DC

## 2015-12-26 MED ORDER — LISINOPRIL-HYDROCHLOROTHIAZIDE 10-12.5 MG PO TABS: 1.0000 | ORAL_TABLET | Freq: Every day | ORAL | 0 refills | Status: DC

## 2015-12-26 NOTE — Telephone Encounter (Signed)
-----   Message from Arnoldo Morale, MD sent at 12/22/2015  8:55 AM EDT ----- Mammogram is negative for malignancy.

## 2015-12-26 NOTE — Telephone Encounter (Signed)
Carmen Burns spoke wit patient and let her know that her mammogram was normal.

## 2015-12-26 NOTE — Telephone Encounter (Signed)
Writer called patient through [employer].  LVM for patient to call back to discuss lab results.

## 2015-12-26 NOTE — Telephone Encounter (Signed)
Refilled requested medications - patient due for office visit.

## 2015-12-26 NOTE — Telephone Encounter (Signed)
Patient needs a refill for janumet and lisinopril. Please follow up.

## 2016-01-17 ENCOUNTER — Ambulatory Visit (AMBULATORY_SURGERY_CENTER): Payer: Self-pay | Admitting: *Deleted

## 2016-01-17 VITALS — Ht 64.0 in | Wt 206.0 lb

## 2016-01-17 DIAGNOSIS — Z86010 Personal history of colonic polyps: Secondary | ICD-10-CM

## 2016-01-17 MED ORDER — NA SULFATE-K SULFATE-MG SULF 17.5-3.13-1.6 GM/177ML PO SOLN: 1.0000 | Freq: Once | ORAL | 0 refills | Status: AC

## 2016-01-17 NOTE — Progress Notes (Signed)
Interpreter at patient's side during PV today. Patient denies any allergies to eggs or soy. Patient denies any problems with anesthesia/sedation. Patient denies any oxygen use at home and does not take any diet/weight loss medications.

## 2016-01-19 ENCOUNTER — Ambulatory Visit: Payer: Self-pay | Attending: Family Medicine | Admitting: Family Medicine

## 2016-01-19 ENCOUNTER — Encounter: Payer: Self-pay | Admitting: Family Medicine

## 2016-01-19 VITALS — BP 144/83 | HR 74 | Temp 97.7°F | Ht 64.0 in | Wt 206.4 lb

## 2016-01-19 DIAGNOSIS — R1319 Other dysphagia: Secondary | ICD-10-CM | POA: Insufficient documentation

## 2016-01-19 DIAGNOSIS — E1149 Type 2 diabetes mellitus with other diabetic neurological complication: Secondary | ICD-10-CM

## 2016-01-19 DIAGNOSIS — I10 Essential (primary) hypertension: Secondary | ICD-10-CM | POA: Insufficient documentation

## 2016-01-19 DIAGNOSIS — E7800 Pure hypercholesterolemia, unspecified: Secondary | ICD-10-CM | POA: Insufficient documentation

## 2016-01-19 DIAGNOSIS — E118 Type 2 diabetes mellitus with unspecified complications: Secondary | ICD-10-CM

## 2016-01-19 DIAGNOSIS — Z880 Allergy status to penicillin: Secondary | ICD-10-CM | POA: Insufficient documentation

## 2016-01-19 DIAGNOSIS — E0800 Diabetes mellitus due to underlying condition with hyperosmolarity without nonketotic hyperglycemic-hyperosmolar coma (NKHHC): Secondary | ICD-10-CM

## 2016-01-19 DIAGNOSIS — Z7984 Long term (current) use of oral hypoglycemic drugs: Secondary | ICD-10-CM | POA: Insufficient documentation

## 2016-01-19 DIAGNOSIS — E870 Hyperosmolality and hypernatremia: Secondary | ICD-10-CM | POA: Insufficient documentation

## 2016-01-19 DIAGNOSIS — D649 Anemia, unspecified: Secondary | ICD-10-CM | POA: Insufficient documentation

## 2016-01-19 DIAGNOSIS — E1140 Type 2 diabetes mellitus with diabetic neuropathy, unspecified: Secondary | ICD-10-CM | POA: Insufficient documentation

## 2016-01-19 LAB — POCT GLYCOSYLATED HEMOGLOBIN (HGB A1C): Hemoglobin A1C: 6.6

## 2016-01-19 LAB — LIPID PANEL
CHOL/HDL RATIO: 3.2 ratio (ref <=5.0)
Cholesterol: 163 mg/dL (ref 125–200)
HDL: 51 mg/dL (ref >=46)
LDL Cholesterol: 81 mg/dL (ref <130)
Triglycerides: 157 mg/dL — ABNORMAL HIGH (ref <150)
VLDL: 31 mg/dL — AB (ref <30)

## 2016-01-19 LAB — COMPLETE METABOLIC PANEL WITH GFR
ALBUMIN: 3.9 g/dL (ref 3.6–5.1)
ALK PHOS: 88 U/L (ref 33–130)
ALT: 13 U/L (ref 6–29)
AST: 18 U/L (ref 10–35)
BUN: 14 mg/dL (ref 7–25)
CO2: 25 mmol/L (ref 20–31)
CREATININE: 0.88 mg/dL (ref 0.50–1.05)
Calcium: 9.4 mg/dL (ref 8.6–10.4)
Chloride: 107 mmol/L (ref 98–110)
GFR, Est African American: 85 mL/min (ref >=60)
GFR, Est Non African American: 74 mL/min (ref >=60)
GLUCOSE: 113 mg/dL — AB (ref 65–99)
POTASSIUM: 4.4 mmol/L (ref 3.5–5.3)
SODIUM: 140 mmol/L (ref 135–146)
TOTAL PROTEIN: 7 g/dL (ref 6.1–8.1)
Total Bilirubin: 0.5 mg/dL (ref 0.2–1.2)

## 2016-01-19 LAB — GLUCOSE, POCT (MANUAL RESULT ENTRY): POC Glucose: 112 mg/dl — AB (ref 70–99)

## 2016-01-19 MED ORDER — POLYETHYLENE GLYCOL 3350 17 GM/SCOOP PO POWD: 17.0000 g | Freq: Two times a day (BID) | ORAL | 1 refills | Status: DC | PRN

## 2016-01-19 MED ORDER — ATORVASTATIN CALCIUM 20 MG PO TABS: 20.0000 mg | ORAL_TABLET | Freq: Every day | ORAL | 5 refills | Status: DC

## 2016-01-19 MED ORDER — GLIPIZIDE 10 MG PO TABS
10.0000 mg | ORAL_TABLET | Freq: Two times a day (BID) | ORAL | 5 refills | Status: DC
Start: 2016-01-19 — End: 2016-06-12

## 2016-01-19 MED ORDER — GABAPENTIN 300 MG PO CAPS: 300.0000 mg | ORAL_CAPSULE | Freq: Every day | ORAL | 5 refills | Status: DC

## 2016-01-19 MED ORDER — LISINOPRIL-HYDROCHLOROTHIAZIDE 10-12.5 MG PO TABS: 1.0000 | ORAL_TABLET | Freq: Every day | ORAL | 5 refills | Status: DC

## 2016-01-19 NOTE — Progress Notes (Signed)
Subjective:  Patient ID: Carmen Burns, female    DOB: 1960-05-23  Age: 55 y.o. MRN: [id number]  CC: Diabetes and Hypertension   HPI [name] is a 55 year old female with a history of type 2 diabetes mellitus (A1c 6.6 from today), diabetic neuropathy, hypertension, hyperlipidemia who presents today for follow-up visit.  Glipizide was added to her regimen at her last office visit and her A1c has trended down from 8.3 to 6.6. Her blood sugar log reveals fasting sugars in the 120-140 range and she rarely has random sugars above 200. Denies visual symptoms or numbness in extremities.  She would like to come off Janumet as she complains of constipation, abdominal bloating, difficulty swallowing which she attributes to side effects from the medication. We have discussed that constipation could arise from inadequate fiber intake.  Blood pressure is elevated due to running out of her antihypertensives but she has been adhering to a low sodium diet.  Denies chest pains or shortness of breath.   Past Medical History:  Diagnosis Date  . Anemia 2013  . Diabetes mellitus without complication ([place])   . Hyperlipidemia   . Hypertension   . Refusal of blood transfusions as patient is Jehovah's Witness     Past Surgical History:  Procedure Laterality Date  . APPENDECTOMY    . DILITATION & CURRETTAGE/HYSTROSCOPY WITH NOVASURE ABLATION N/A 01/12/2013   Procedure: DILATATION & CURETTAGE/HYSTEROSCOPY WITH NOVASURE ABLATION;  Surgeon: Osborne Oman, MD;  Location: [facility];  Service: Gynecology;  Laterality: N/A;    Allergies  Allergen Reactions  . Penicillins Itching and Rash     Outpatient Medications Prior to Visit  Medication Sig Dispense Refill  . Blood Glucose Monitoring Suppl (TRUE METRIX METER) W/DEVICE KIT USE TO CHECK BLOOD SUGAR THREE TIMES DAILY AND AT BEDTIME 1 kit 0  . glucose blood (TRUE METRIX BLOOD GLUCOSE TEST) test strip USE TO CHECK BLOOD SUGAR THREE TIMES  DAILY AND AT BEDTIME 100 each 12  . naproxen sodium (ANAPROX) 550 MG tablet Take 1 tablet (550 mg total) by mouth 2 (two) times daily with a meal. For pain 30 tablet 2  . TRUEPLUS LANCETS 28G MISC USE TO CHECK BLOOD SUGAR THREE TIMES DAILY AND AT BEDTIME 100 each 2  . atorvastatin (LIPITOR) 20 MG tablet Take 1 tablet (20 mg total) by mouth daily. 30 tablet 3  . gabapentin (NEURONTIN) 300 MG capsule Take 1 capsule (300 mg total) by mouth at bedtime. 30 capsule 3  . lisinopril-hydrochlorothiazide (PRINZIDE,ZESTORETIC) 10-12.5 MG tablet Take 1 tablet by mouth daily. 30 tablet 0  . sitaGLIPtin-metformin (JANUMET) 50-1000 MG tablet Take 1 tablet by mouth 2 (two) times daily with a meal. 30 tablet 0  . cyclobenzaprine (FLEXERIL) 10 MG tablet Take 1 tablet (10 mg total) by mouth 3 (three) times daily as needed for muscle spasms. (Patient not taking: Reported on 01/19/2016) 30 tablet 0  . glipiZIDE (GLUCOTROL) 5 MG tablet Take 0.5 tablets (2.5 mg total) by mouth 2 (two) times daily before a meal. (Patient not taking: Reported on 01/19/2016) 30 tablet 3   No facility-administered medications prior to visit.     ROS Review of Systems  Constitutional: Negative for activity change, appetite change and fatigue.  HENT: Negative for congestion, sinus pressure and sore throat.   Eyes: Negative for visual disturbance.  Respiratory: Negative for cough, chest tightness, shortness of breath and wheezing.   Cardiovascular: Negative for chest pain and palpitations.  Gastrointestinal: Positive for abdominal distention and constipation. Negative  for abdominal pain.  Endocrine: Negative for polydipsia.  Genitourinary: Negative for dysuria and frequency.  Musculoskeletal: Negative for arthralgias and back pain.  Skin: Negative for rash.  Neurological: Negative for tremors, light-headedness and numbness.  Hematological: Does not bruise/bleed easily.  Psychiatric/Behavioral: Negative for agitation and behavioral  problems.    Objective:  BP (!) 144/83 (BP Location: Right Arm, Patient Position: Sitting, Cuff Size: Large)   Pulse 74   Temp 97.7 F (36.5 C) (Oral)   Ht 5' 4"  (1.626 m)   Wt 206 lb 6.4 oz (93.6 kg)   SpO2 99%   BMI 35.43 kg/m   BP/Weight 01/19/2016 01/17/2016 1/61/0960  Systolic BP 454 - 098  Diastolic BP 83 - 78  Wt. (Lbs) 206.4 206 206  BMI 35.43 35.36 35.34      Physical Exam  Constitutional: She is oriented to person, place, and time. She appears well-developed and well-nourished.  Neck: No JVD present.  Cardiovascular: Normal rate, normal heart sounds and intact distal pulses.   No murmur heard. Pulmonary/Chest: Effort normal and breath sounds normal. She has no wheezes. She has no rales. She exhibits no tenderness.  Abdominal: Soft. Bowel sounds are normal. She exhibits no distension and no mass. There is no tenderness.  Musculoskeletal: Normal range of motion.  Neurological: She is alert and oriented to person, place, and time.  Skin: Skin is warm.  Psychiatric: She has a normal mood and affect.     Lab Results  Component Value Date   HGBA1C 6.6 01/19/2016   Lipid Panel     Component Value Date/Time   CHOL 206 (H) 03/22/2015 0918   TRIG 188 (H) 03/22/2015 0918   HDL 51 03/22/2015 0918   CHOLHDL 4.0 03/22/2015 0918   VLDL 38 (H) 03/22/2015 0918   LDLCALC 117 03/22/2015 0918    CMP Latest Ref Rng & Units 03/22/2015 08/25/2014 01/05/2013  Glucose 65 - 99 mg/dL 213(H) 171(H) 179(H)  BUN 7 - 25 mg/dL 11 13 10   Creatinine 0.50 - 1.05 mg/dL 0.71 0.69 0.64  Sodium 135 - 146 mmol/L 137 135 136  Potassium 3.5 - 5.3 mmol/L 4.3 5.2 4.5  Chloride 98 - 110 mmol/L 101 100 99  CO2 20 - 31 mmol/L 23 28 26   Calcium 8.6 - 10.4 mg/dL 9.5 9.6 10.4  Total Protein 6.0 - 8.3 g/dL - 7.2 -  Total Bilirubin 0.2 - 1.2 mg/dL - 0.5 -  Alkaline Phos 39 - 117 U/L - 95 -  AST 0 - 37 U/L - 31 -  ALT 0 - 35 U/L - 29 -    Assessment & Plan:   1. Diabetes mellitus due to  underlying condition with hyperosmolarity without coma, without long-term current use of insulin (HCC) Controlled with A1c of 6.6 which has trended down from 8.3 previously [place] as per patient request due to concerns about side effects Increased Glipizide from 2.28m bid to 173mbid Will add Invokana if hyperglycemic at next visit Keep blood sugar logs with fasting goals of 80-120 mg/dl, random of less than 180 and in the event of sugars less than 60 mg/dl or greater than 400 mg/dl please notify the clinic ASAP. It is recommended that you undergo annual eye exams and annual foot exams. Pneumovax is recommended every 5 years before the age of 6545nd once for a lifetime at or after the age of 57- Glucose (CBG) - HgB A1c  2. Other dysphagia Advised to use OTC antihistamines Patient seems to  think it is medication induced  3. Type 2 diabetes mellitus with complication, without long-term current use of insulin (HCC) - Microalbumin / creatinine urine ratio - COMPLETE METABOLIC PANEL WITH GFR - Lipid panel - glipiZIDE (GLUCOTROL) 10 MG tablet; Take 1 tablet (10 mg total) by mouth 2 (two) times daily before a meal.  Dispense: 60 tablet; Refill: 5  4. Pure hypercholesterolemia LDL is 117 above target of <100 Continue statin and low cholesterol diet - atorvastatin (LIPITOR) 20 MG tablet; Take 1 tablet (20 mg total) by mouth daily.  Dispense: 30 tablet; Refill: 5  5. Other diabetic neurological complication associated with type 2 diabetes mellitus (HCC) Stable - gabapentin (NEURONTIN) 300 MG capsule; Take 1 capsule (300 mg total) by mouth at bedtime.  Dispense: 30 capsule; Refill: 5  6. Essential hypertension Uncontrolled due to running out of medications which I have refilled - lisinopril-hydrochlorothiazide (PRINZIDE,ZESTORETIC) 10-12.5 MG tablet; Take 1 tablet by mouth daily.  Dispense: 30 tablet; Refill: 5  Health care maintenance will be addresses at next office  visit.  Meds ordered this encounter  Medications  . polyethylene glycol powder (GLYCOLAX/MIRALAX) powder    Sig: Take 17 g by mouth 2 (two) times daily as needed.    Dispense:  3350 g    Refill:  1  . glipiZIDE (GLUCOTROL) 10 MG tablet    Sig: Take 1 tablet (10 mg total) by mouth 2 (two) times daily before a meal.    Dispense:  60 tablet    Refill:  5    Discontinue Janumet  . atorvastatin (LIPITOR) 20 MG tablet    Sig: Take 1 tablet (20 mg total) by mouth daily.    Dispense:  30 tablet    Refill:  5  . gabapentin (NEURONTIN) 300 MG capsule    Sig: Take 1 capsule (300 mg total) by mouth at bedtime.    Dispense:  30 capsule    Refill:  5  . lisinopril-hydrochlorothiazide (PRINZIDE,ZESTORETIC) 10-12.5 MG tablet    Sig: Take 1 tablet by mouth daily.    Dispense:  30 tablet    Refill:  5    This note has been created with Surveyor, quantity. Any transcriptional errors are unintentional.    Follow-up: Return in about 3 weeks (around 02/09/2016) for follow up of Diabetes Mellitus.   Arnoldo Morale MD

## 2016-01-19 NOTE — Progress Notes (Signed)
Medication refills

## 2016-01-19 NOTE — Patient Instructions (Signed)
Diabetes Mellitus and Food It is important for you to manage your blood sugar (glucose) level. Your blood glucose level can be greatly affected by what you eat. Eating healthier foods in the appropriate amounts throughout the day at about the same time each day will help you control your blood glucose level. It can also help slow or prevent worsening of your diabetes mellitus. Healthy eating may even help you improve the level of your blood pressure and reach or maintain a healthy weight.  General recommendations for healthful eating and cooking habits include:  Eating meals and snacks regularly. Avoid going long periods of time without eating to lose weight.  Eating a diet that consists mainly of plant-based foods, such as fruits, vegetables, nuts, legumes, and whole grains.  Using low-heat cooking methods, such as baking, instead of high-heat cooking methods, such as deep frying. Work with your dietitian to make sure you understand how to use the Nutrition Facts information on food labels. HOW CAN FOOD AFFECT ME? Carbohydrates Carbohydrates affect your blood glucose level more than any other type of food. Your dietitian will help you determine how many carbohydrates to eat at each meal and teach you how to count carbohydrates. Counting carbohydrates is important to keep your blood glucose at a healthy level, especially if you are using insulin or taking certain medicines for diabetes mellitus. Alcohol Alcohol can cause sudden decreases in blood glucose (hypoglycemia), especially if you use insulin or take certain medicines for diabetes mellitus. Hypoglycemia can be a life-threatening condition. Symptoms of hypoglycemia (sleepiness, dizziness, and disorientation) are similar to symptoms of having too much alcohol.  If your health care provider has given you approval to drink alcohol, do so in moderation and use the following guidelines:  Women should not have more than one drink per day, and men  should not have more than two drinks per day. One drink is equal to:  12 oz of beer.  5 oz of wine.  1 oz of hard liquor.  Do not drink on an empty stomach.  Keep yourself hydrated. Have water, diet soda, or unsweetened iced tea.  Regular soda, juice, and other mixers might contain a lot of carbohydrates and should be counted. WHAT FOODS ARE NOT RECOMMENDED? As you make food choices, it is important to remember that all foods are not the same. Some foods have fewer nutrients per serving than other foods, even though they might have the same number of calories or carbohydrates. It is difficult to get your body what it needs when you eat foods with fewer nutrients. Examples of foods that you should avoid that are high in calories and carbohydrates but low in nutrients include:  Trans fats (most processed foods list trans fats on the Nutrition Facts label).  Regular soda.  Juice.  Candy.  Sweets, such as cake, pie, doughnuts, and cookies.  Fried foods. WHAT FOODS CAN I EAT? Eat nutrient-rich foods, which will nourish your body and keep you healthy. The food you should eat also will depend on several factors, including:  The calories you need.  The medicines you take.  Your weight.  Your blood glucose level.  Your blood pressure level.  Your cholesterol level. You should eat a variety of foods, including:  Protein.  Lean cuts of meat.  Proteins low in saturated fats, such as fish, egg whites, and beans. Avoid processed meats.  Fruits and vegetables.  Fruits and vegetables that may help control blood glucose levels, such as apples, mangoes, and   yams.  Dairy products.  Choose fat-free or low-fat dairy products, such as milk, yogurt, and cheese.  Grains, bread, pasta, and rice.  Choose whole grain products, such as multigrain bread, whole oats, and brown rice. These foods may help control blood pressure.  Fats.  Foods containing healthful fats, such as nuts,  avocado, olive oil, canola oil, and fish. DOES EVERYONE WITH DIABETES MELLITUS HAVE THE SAME MEAL PLAN? Because every person with diabetes mellitus is different, there is not one meal plan that works for everyone. It is very important that you meet with a dietitian who will help you create a meal plan that is just right for you.   This information is not intended to replace advice given to you by your health care provider. Make sure you discuss any questions you have with your health care provider.   Document Released: 12/06/2004 Document Revised: 04/01/2014 Document Reviewed: 02/05/2013 Elsevier Interactive Patient Education 2016 Elsevier Inc.  

## 2016-01-20 LAB — MICROALBUMIN / CREATININE URINE RATIO
CREATININE, URINE: 60 mg/dL (ref 20–320)
MICROALB UR: 0.3 mg/dL
MICROALB/CREAT RATIO: 5 ug/mg{creat} (ref <30)

## 2016-01-29 ENCOUNTER — Telehealth: Payer: Self-pay

## 2016-01-29 NOTE — Telephone Encounter (Signed)
After several attempts with [employer] to reach patient including today Probation officer sent her a letter regarding her lab results.

## 2016-01-29 NOTE — Telephone Encounter (Signed)
-----   Message from Arnoldo Morale, MD sent at 01/22/2016  2:03 PM EDT ----- Please inform the patient that labs are normal. Thank you.

## 2016-01-31 ENCOUNTER — Ambulatory Visit (AMBULATORY_SURGERY_CENTER): Payer: Self-pay | Admitting: Gastroenterology

## 2016-01-31 ENCOUNTER — Encounter: Payer: Self-pay | Admitting: Gastroenterology

## 2016-01-31 VITALS — BP 136/79 | HR 63 | Temp 98.4°F | Resp 17 | Ht 64.0 in | Wt 206.0 lb

## 2016-01-31 DIAGNOSIS — D120 Benign neoplasm of cecum: Secondary | ICD-10-CM

## 2016-01-31 DIAGNOSIS — Z86010 Personal history of colonic polyps: Secondary | ICD-10-CM

## 2016-01-31 DIAGNOSIS — D121 Benign neoplasm of appendix: Secondary | ICD-10-CM

## 2016-01-31 LAB — GLUCOSE, CAPILLARY
GLUCOSE-CAPILLARY: 141 mg/dL — AB (ref 65–99)
GLUCOSE-CAPILLARY: 114 mg/dL — AB (ref 65–99)

## 2016-01-31 MED ORDER — SODIUM CHLORIDE 0.9 % IV SOLN: 500.0000 mL | INTRAVENOUS | Status: DC

## 2016-01-31 NOTE — Op Note (Signed)
[facility] Patient Name: Carmen Burns Procedure Date: 01/31/2016 11:37 AM MRN: [id number] Endoscopist: Remo Lipps P. Armbruster MD, MD Age: 55 Referring MD:  Date of Birth: May 26, 1960 Gender: Female Account #: [id number] Procedure:                Colonoscopy Indications:              High risk colon cancer surveillance: Personal                            history of colonic polyps - sessile serrated polyp                            invading AO removed in piecemeal a few months ago Medicines:                Monitored Anesthesia Care Procedure:                Pre-Anesthesia Assessment:                           - Prior to the procedure, a History and Physical                            was performed, and patient medications and                            allergies were reviewed. The patient's tolerance of                            previous anesthesia was also reviewed. The risks                            and benefits of the procedure and the sedation                            options and risks were discussed with the patient.                            All questions were answered, and informed consent                            was obtained. Prior Anticoagulants: The patient has                            taken no previous anticoagulant or antiplatelet                            agents. ASA Grade Assessment: II - A patient with                            mild systemic disease. After reviewing the risks                            and benefits, the patient was deemed in  satisfactory condition to undergo the procedure.                           After obtaining informed consent, the colonoscope                            was passed under direct vision. Throughout the                            procedure, the patient's blood pressure, pulse, and                            oxygen saturations were monitored continuously. The                             Model PCF-H190L [id number]) scope was introduced                            through the anus and advanced to the the cecum,                            identified by appendiceal orifice and ileocecal                            valve. The colonoscopy was performed without                            difficulty. The patient tolerated the procedure                            well. The quality of the bowel preparation was                            good. The ileocecal valve, appendiceal orifice, and                            rectum were photographed. Scope In: 11:48:02 AM Scope Out: 12:28:28 PM Scope Withdrawal Time: 0 hours 37 minutes 6 seconds  Total Procedure Duration: 0 hours 40 minutes 26 seconds  Findings:                 The perianal and digital rectal examinations were                            normal.                           An area of residual polyp was found in the                            appendiceal orifice. The polyp was sessile but                            invaded down into the appediceal orifice. Area was  injected with saline for a lift polypectomy, but it                            did not lift very well to see all the margins of                            the residual polyp tissue, given it's location and                            prior treatment. The polyp was removed with a                            piecemeal technique using a cold snare in multiple                            passes, although it is possible residual polyp was                            present at the site given it was not easy to                            visualize. Coagulation with the snare tip was then                            used to treat the base of the polyp area.                           Non-bleeding internal hemorrhoids were found during                            retroflexion.                           The exam was otherwise without  abnormality. Complications:            No immediate complications. Estimated blood loss:                            Minimal. Estimated Blood Loss:     Estimated blood loss was minimal. Impression:               - Residual polyp at the appendiceal orifice, lifted                            and removed piecemeal using a cold snare as above,                            with coagulation applied using snare tip. While the                            residual polypoid area was small, it was  technically challenging to visualize the entire                            area, did not lift well due to it's location and                            scar tissue from prior appendectomy and initial                            polypectomy                           - Non-bleeding internal hemorrhoids.                           - The examination was otherwise normal. Recommendation:           - Patient has a contact number available for                            emergencies. The signs and symptoms of potential                            delayed complications were discussed with the                            patient. Return to normal activities tomorrow.                            Written discharge instructions were provided to the                            patient.                           - Resume previous diet.                           - Continue present medications.                           - No aspirin, ibuprofen, naproxen, or other                            non-steroidal anti-inflammatory drugs for 2 weeks                            after polyp removal.                           - Await pathology results.                           - We will discuss options moving forward -                            colonoscopy surveillance versus surgical resection  due to concern for residual polypoid tissue. Remo Lipps P. Armbruster MD, MD 01/31/2016 12:40:05  PM This report has been signed electronically.

## 2016-01-31 NOTE — Progress Notes (Signed)
Called to room to assist during endoscopic procedure.  Patient ID and intended procedure confirmed with present staff. Received instructions for my participation in the procedure from the performing physician.  

## 2016-01-31 NOTE — Patient Instructions (Signed)
YOU HAD AN ENDOSCOPIC PROCEDURE TODAY AT [facility]:   Refer to the procedure report that was given to you for any specific questions about what was found during the examination.  If the procedure report does not answer your questions, please call your gastroenterologist to clarify.  If you requested that your care partner not be given the details of your procedure findings, then the procedure report has been included in a sealed envelope for you to review at your convenience later.  YOU SHOULD EXPECT: Some feelings of bloating in the abdomen. Passage of more gas than usual.  Walking can help get rid of the air that was put into your GI tract during the procedure and reduce the bloating. If you had a lower endoscopy (such as a colonoscopy or flexible sigmoidoscopy) you may notice spotting of blood in your stool or on the toilet paper. If you underwent a bowel prep for your procedure, you may not have a normal bowel movement for a few days.  Please Note:  You might notice some irritation and congestion in your nose or some drainage.  This is from the oxygen used during your procedure.  There is no need for concern and it should clear up in a day or so.  SYMPTOMS TO REPORT IMMEDIATELY:   Following lower endoscopy (colonoscopy or flexible sigmoidoscopy):  Excessive amounts of blood in the stool  Significant tenderness or worsening of abdominal pains  Swelling of the abdomen that is new, acute  Fever of 100F or higher   Following upper endoscopy (EGD)  Vomiting of blood or coffee ground material  New chest pain or pain under the shoulder blades  Painful or persistently difficult swallowing  New shortness of breath  Fever of 100F or higher  Black, tarry-looking stools  For urgent or emergent issues, a gastroenterologist can be reached at any hour by calling [phone].   DIET:  We do recommend a small meal at first, but then you may proceed to your regular diet.  Drink  plenty of fluids but you should avoid alcoholic beverages for 24 hours.  ACTIVITY:  You should plan to take it easy for the rest of today and you should NOT DRIVE or use heavy machinery until tomorrow (because of the sedation medicines used during the test).    FOLLOW UP: Our staff will call the number listed on your records the next business day following your procedure to check on you and address any questions or concerns that you may have regarding the information given to you following your procedure. If we do not reach you, we will leave a message.  However, if you are feeling well and you are not experiencing any problems, there is no need to return our call.  We will assume that you have returned to your regular daily activities without incident.  If any biopsies were taken you will be contacted by phone or by letter within the next 1-3 weeks.  Please call us at [phone] if you have not heard about the biopsies in 3 weeks.    SIGNATURES/CONFIDENTIALITY: You and/or your care partner have signed paperwork which will be entered into your electronic medical record.  These signatures attest to the fact that that the information above on your After Visit Summary has been reviewed and is understood.  Full responsibility of the confidentiality of this discharge information lies with you and/or your care-partner.    Handouts were given to your care partner on polyps  and hemorrhoids. No aspirin, aspirin products,  ibuprofen, naproxen, advil, motrin, aleve, or other non-steroidal anti-inflammatory drugs for 14 days after polyp removal. Your blood sugar was 114 in the recovery room. You may resume your other current medications today. Await biopsy results. Please call if any questions or concerns.

## 2016-01-31 NOTE — Progress Notes (Signed)
A/ox3, pleased with MAC, report to RN 

## 2016-01-31 NOTE — Progress Notes (Signed)
Pt speaks spanish only.  Her daughter-in-law, Raymon Mutton speaks [place].  Pt asked we call her daughter-in-law tomorrow am At # [phone].  Pt also had an enterpreter, [employer] from [place].  No problems noted in the recovery room. maw

## 2016-02-01 ENCOUNTER — Telehealth: Payer: Self-pay

## 2016-02-01 NOTE — Telephone Encounter (Signed)
Left message on answering machine. 

## 2016-02-01 NOTE — Telephone Encounter (Signed)
  Follow up Call-  Call back number 01/31/2016 09/20/2015  Post procedure Call Back phone  # [phone] daughter in law, Raymon Mutton [phone]  Permission to leave phone message Yes Yes  Some recent data might be hidden    Patient was called for follow up after her procedure on 01/31/2016. I spoke with the patients daughter in law and she reports that Retina has returned to her normal daily activities without any complications.

## 2016-02-20 ENCOUNTER — Ambulatory Visit: Payer: Self-pay | Attending: Family Medicine | Admitting: Family Medicine

## 2016-02-20 ENCOUNTER — Encounter: Payer: Self-pay | Admitting: Family Medicine

## 2016-02-20 VITALS — BP 130/84 | HR 86 | Temp 98.1°F | Ht 64.0 in | Wt 202.0 lb

## 2016-02-20 DIAGNOSIS — Z23 Encounter for immunization: Secondary | ICD-10-CM

## 2016-02-20 DIAGNOSIS — E870 Hyperosmolality and hypernatremia: Secondary | ICD-10-CM | POA: Insufficient documentation

## 2016-02-20 DIAGNOSIS — I10 Essential (primary) hypertension: Secondary | ICD-10-CM | POA: Insufficient documentation

## 2016-02-20 DIAGNOSIS — Z0000 Encounter for general adult medical examination without abnormal findings: Secondary | ICD-10-CM

## 2016-02-20 DIAGNOSIS — Z880 Allergy status to penicillin: Secondary | ICD-10-CM | POA: Insufficient documentation

## 2016-02-20 DIAGNOSIS — D649 Anemia, unspecified: Secondary | ICD-10-CM | POA: Insufficient documentation

## 2016-02-20 DIAGNOSIS — E0800 Diabetes mellitus due to underlying condition with hyperosmolarity without nonketotic hyperglycemic-hyperosmolar coma (NKHHC): Secondary | ICD-10-CM

## 2016-02-20 DIAGNOSIS — E1140 Type 2 diabetes mellitus with diabetic neuropathy, unspecified: Secondary | ICD-10-CM | POA: Insufficient documentation

## 2016-02-20 DIAGNOSIS — E785 Hyperlipidemia, unspecified: Secondary | ICD-10-CM | POA: Insufficient documentation

## 2016-02-20 LAB — GLUCOSE, POCT (MANUAL RESULT ENTRY): POC GLUCOSE: 155 mg/dL — AB (ref 70–99)

## 2016-02-20 NOTE — Progress Notes (Signed)
Subjective:  Patient ID: Carmen Burns, female    DOB: 30-Jan-1961  Age: 55 y.o. MRN: [id number]  CC: Diabetes   HPI Carmen Burns is a 55 year old female with a history of type 2 diabetes mellitus (A1c 6.6), diabetic neuropathy, hypertension, hyperlipidemia who presents today for follow-up visit.  Janumet was discontinued at her last office visit due to GI side effects and the dose of her glipizide was increased. Random blood sugars have been less than 160.  She has no other concerns today  Past Medical History:  Diagnosis Date  . Anemia 2013  . Diabetes mellitus without complication ([place])   . Hyperlipidemia   . Hypertension   . Refusal of blood transfusions as patient is Jehovah's Witness     Past Surgical History:  Procedure Laterality Date  . APPENDECTOMY    . DILITATION & CURRETTAGE/HYSTROSCOPY WITH NOVASURE ABLATION N/A 01/12/2013   Procedure: DILATATION & CURETTAGE/HYSTEROSCOPY WITH NOVASURE ABLATION;  Surgeon: Osborne Oman, MD;  Location: [facility];  Service: Gynecology;  Laterality: N/A;    Allergies  Allergen Reactions  . Penicillins Itching and Rash      Outpatient Medications Prior to Visit  Medication Sig Dispense Refill  . atorvastatin (LIPITOR) 20 MG tablet Take 1 tablet (20 mg total) by mouth daily. 30 tablet 5  . Blood Glucose Monitoring Suppl (TRUE METRIX METER) W/DEVICE KIT USE TO CHECK BLOOD SUGAR THREE TIMES DAILY AND AT BEDTIME 1 kit 0  . cyclobenzaprine (FLEXERIL) 10 MG tablet Take 1 tablet (10 mg total) by mouth 3 (three) times daily as needed for muscle spasms. 30 tablet 0  . glipiZIDE (GLUCOTROL) 10 MG tablet Take 1 tablet (10 mg total) by mouth 2 (two) times daily before a meal. 60 tablet 5  . glucose blood (TRUE METRIX BLOOD GLUCOSE TEST) test strip USE TO CHECK BLOOD SUGAR THREE TIMES DAILY AND AT BEDTIME 100 each 12  . lisinopril-hydrochlorothiazide (PRINZIDE,ZESTORETIC) 10-12.5 MG tablet Take 1 tablet by mouth daily. 30 tablet 5  .  naproxen sodium (ANAPROX) 550 MG tablet Take 1 tablet (550 mg total) by mouth 2 (two) times daily with a meal. For pain 30 tablet 2  . polyethylene glycol powder (GLYCOLAX/MIRALAX) powder Take 17 g by mouth 2 (two) times daily as needed. 3350 g 1  . TRUEPLUS LANCETS 28G MISC USE TO CHECK BLOOD SUGAR THREE TIMES DAILY AND AT BEDTIME 100 each 2  . gabapentin (NEURONTIN) 300 MG capsule Take 1 capsule (300 mg total) by mouth at bedtime. 30 capsule 5   Facility-Administered Medications Prior to Visit  Medication Dose Route Frequency Provider Last Rate Last Dose  . 0.9 %  sodium chloride infusion  500 mL Intravenous Continuous Manus Gunning, MD        ROS Review of Systems  Constitutional: Negative for activity change and appetite change.  HENT: Negative for sinus pressure and sore throat.   Respiratory: Negative for chest tightness, shortness of breath and wheezing.   Cardiovascular: Negative for chest pain and palpitations.  Gastrointestinal: Negative for abdominal distention, abdominal pain and constipation.  Genitourinary: Negative.   Musculoskeletal: Negative.   Psychiatric/Behavioral: Negative for behavioral problems and dysphoric mood.    Objective:  BP 130/84 (BP Location: Right Arm, Patient Position: Sitting, Cuff Size: Small)   Pulse 86   Temp 98.1 F (36.7 C) (Oral)   Ht _0  (1.626 m)   Wt 202 lb (91.6 kg)   SpO2 97%   BMI 34.67 kg/m   BP/Weight 02/20/2016 01/31/2016  88/87/5797  Systolic BP 282 060 156  Diastolic BP 84 79 83  Wt. (Lbs) 202 206 206.4  BMI 34.67 35.36 35.43      Physical Exam  Constitutional: She is oriented to person, place, and time. She appears well-developed and well-nourished.  Cardiovascular: Normal rate, normal heart sounds and intact distal pulses.   No murmur heard. Pulmonary/Chest: Effort normal and breath sounds normal. She has no wheezes. She has no rales. She exhibits no tenderness.  Abdominal: Soft. Bowel sounds are normal.  She exhibits no distension and no mass. There is no tenderness.  Musculoskeletal: Normal range of motion.  Neurological: She is alert and oriented to person, place, and time.    Lab Results  Component Value Date   HGBA1C 6.6 01/19/2016    Assessment & Plan:   1. Diabetes mellitus due to underlying condition with hyperosmolarity without coma, without long-term current use of insulin (HCC) Controlled with A1c of 6.6 Continue medications - Glucose (CBG)  2. Healthcare maintenance Pneumovax today Declines Tdap Up-to-date on mammogram, colonoscopy According to patient she had a Pap smear done at an outside facility and was informed it was normal.   No orders of the defined types were placed in this encounter.   Follow-up: Return in about 3 months (around 05/22/2016) for follow up of Diabetes.   Arnoldo Morale MD

## 2016-02-26 ENCOUNTER — Encounter: Payer: Self-pay | Admitting: Gastroenterology

## 2016-03-27 ENCOUNTER — Other Ambulatory Visit: Payer: Self-pay | Admitting: Internal Medicine

## 2016-05-03 ENCOUNTER — Other Ambulatory Visit: Payer: Self-pay | Admitting: Internal Medicine

## 2016-06-05 ENCOUNTER — Encounter: Payer: Self-pay | Admitting: Gastroenterology

## 2016-06-12 ENCOUNTER — Ambulatory Visit: Payer: Self-pay | Attending: Family Medicine | Admitting: Family Medicine

## 2016-06-12 ENCOUNTER — Encounter: Payer: Self-pay | Admitting: Family Medicine

## 2016-06-12 VITALS — BP 128/85 | HR 91 | Temp 98.1°F | Ht 63.0 in | Wt 213.8 lb

## 2016-06-12 DIAGNOSIS — E1142 Type 2 diabetes mellitus with diabetic polyneuropathy: Secondary | ICD-10-CM | POA: Insufficient documentation

## 2016-06-12 DIAGNOSIS — Z23 Encounter for immunization: Secondary | ICD-10-CM

## 2016-06-12 DIAGNOSIS — E1149 Type 2 diabetes mellitus with other diabetic neurological complication: Secondary | ICD-10-CM

## 2016-06-12 DIAGNOSIS — I10 Essential (primary) hypertension: Secondary | ICD-10-CM | POA: Insufficient documentation

## 2016-06-12 DIAGNOSIS — Z880 Allergy status to penicillin: Secondary | ICD-10-CM | POA: Insufficient documentation

## 2016-06-12 DIAGNOSIS — D649 Anemia, unspecified: Secondary | ICD-10-CM | POA: Insufficient documentation

## 2016-06-12 DIAGNOSIS — E7800 Pure hypercholesterolemia, unspecified: Secondary | ICD-10-CM | POA: Insufficient documentation

## 2016-06-12 DIAGNOSIS — Z7984 Long term (current) use of oral hypoglycemic drugs: Secondary | ICD-10-CM | POA: Insufficient documentation

## 2016-06-12 DIAGNOSIS — E118 Type 2 diabetes mellitus with unspecified complications: Secondary | ICD-10-CM

## 2016-06-12 DIAGNOSIS — S8002XA Contusion of left knee, initial encounter: Secondary | ICD-10-CM | POA: Insufficient documentation

## 2016-06-12 LAB — POCT GLYCOSYLATED HEMOGLOBIN (HGB A1C): Hemoglobin A1C: 8.4

## 2016-06-12 LAB — GLUCOSE, POCT (MANUAL RESULT ENTRY): POC Glucose: 134 mg/dl — AB (ref 70–99)

## 2016-06-12 MED ORDER — ATORVASTATIN CALCIUM 20 MG PO TABS: 20.0000 mg | ORAL_TABLET | Freq: Every day | ORAL | 5 refills | Status: DC

## 2016-06-12 MED ORDER — SITAGLIPTIN PHOSPHATE 100 MG PO TABS: 100.0000 mg | ORAL_TABLET | Freq: Every day | ORAL | 5 refills | Status: DC

## 2016-06-12 MED ORDER — GABAPENTIN 300 MG PO CAPS: 300.0000 mg | ORAL_CAPSULE | Freq: Every day | ORAL | 5 refills | Status: DC

## 2016-06-12 MED ORDER — MUPIROCIN CALCIUM 2 % EX CREA: 1.0000 "application " | TOPICAL_CREAM | Freq: Two times a day (BID) | CUTANEOUS | 0 refills | Status: DC

## 2016-06-12 MED ORDER — GLIPIZIDE 10 MG PO TABS: 10.0000 mg | ORAL_TABLET | Freq: Two times a day (BID) | ORAL | 5 refills | Status: DC

## 2016-06-12 MED ORDER — LISINOPRIL-HYDROCHLOROTHIAZIDE 10-12.5 MG PO TABS: 1.0000 | ORAL_TABLET | Freq: Every day | ORAL | 5 refills | Status: DC

## 2016-06-12 NOTE — Patient Instructions (Signed)
La diabetes mellitus y los alimentos (Diabetes Mellitus and Food) Es importante que controle su nivel de azcar en la sangre (glucosa). El nivel de glucosa en sangre depende en gran medida de lo que usted come. Comer alimentos saludables en las cantidades [place] a lo largo del Training and development officer, aproximadamente a la misma hora [employer], lo ayudar a Chief Technology Officer su nivel de Multimedia programmer. Tambin puede ayudarlo a retrasar o Patent attorney de la diabetes mellitus. Comer de [employer] saludable incluso puede ayudarlo a Chartered loss adjuster de presin arterial y a Science writer o Theatre manager un peso saludable. Entre las recomendaciones generales para alimentarse y Audiological scientist los alimentos de forma saludable, se incluyen las siguientes:  Respetar las comidas principales y comer colaciones con regularidad. Evitar pasar largos perodos sin comer con el fin de perder peso.  Seguir una dieta que consista principalmente en alimentos de origen vegetal, como frutas, vegetales, frutos secos, legumbres y cereales integrales.  Utilizar mtodos de coccin a baja temperatura, como hornear, en lugar de mtodos de coccin a alta temperatura, como frer en abundante aceite. Trabaje con el nutricionista para aprender a Financial planner nutricional de las etiquetas de los alimentos. CMO PUEDEN AFECTARME LOS ALIMENTOS? Carbohidratos Los carbohidratos afectan el nivel de glucosa en sangre ms que cualquier otro tipo de alimento. El nutricionista lo ayudar a Teacher, adult education cuntos carbohidratos puede consumir en cada comida y ensearle a contarlos. El recuento de carbohidratos es importante para mantener la glucosa en sangre en un nivel saludable, en especial si utiliza insulina o toma determinados medicamentos para la diabetes mellitus. Alcohol El alcohol puede provocar disminuciones sbitas de la glucosa en sangre (hipoglucemia), en especial si utiliza insulina o toma determinados medicamentos para la diabetes mellitus. La  hipoglucemia es una afeccin que puede poner en peligro la vida. Los sntomas de la hipoglucemia (somnolencia, mareos y Data processing manager) son similares a los sntomas de haber consumido mucho alcohol. Si el mdico lo autoriza a beber alcohol, hgalo con moderacin y siga estas pautas:  Las mujeres no deben beber ms de un trago por da, y los hombres no deben beber ms de dos tragos por Training and development officer. Un trago es igual a:  12 onzas (355 ml) de cerveza  5 onzas de vino (150 ml) de vino  1,5onzas (23m) de bebidas espirituosas  No beba con el estmago vaco.  Mantngase hidratado. Beba agua, gaseosas dietticas o t helado sin azcar.  Las gaseosas comunes, los jugos y otros refrescos podran contener muchos carbohidratos y se dCivil Service fast streamer QU ALIMENTOS NO SE RECOMIENDAN? Cuando haga las elecciones de alimentos, es importante que recuerde que todos los alimentos son distintos. Algunos tienen menos nutrientes que otros por porcin, aunque podran tener la misma cantidad de caloras o carbohidratos. Es difcil darle al cuerpo lo que necesita cuando consume alimentos con menos nutrientes. Estos son algunos ejemplos de alimentos que debera evitar ya que contienen muchas caloras y carbohidratos, pero pocos nutrientes:  GPhysicist, medicaltrans (la mayora de los alimentos procesados incluyen grasas trans en la etiqueta de Informacin nutricional).  Gaseosas comunes.  Jugos.  Caramelos.  Dulces, como tortas, pasteles, rosquillas y g[place]  Comidas fritas. QU ALIMENTOS PUEDO COMER? Consuma alimentos ricos en nutrientes, que nutrirn el cuerpo y lo mantendrn saludable. Los alimentos que debe comer tambin dependern de varios factores, como:  Las caloras que necesita.  Los medicamentos que toma.  Su peso.  El nivel de glucosa en sElizabeth  El n[place]de presin arterial.  El nivel de colesterol. Debe consumir  una amplia variedad de alimentos, por ejemplo:  Protenas.  Cortes de carne  magros.  Protenas con bajo contenido de grasas saturadas, como pescado, clara de huevo y frijoles. Evite las carnes procesadas.  Frutas y vegetales.  Frutas y vegetales que pueden ayudar a controlar los niveles sanguneos de glucosa, como manzanas, mangos y batatas.  Productos lcteos.  Elija productos lcteos sin grasa o con bajo contenido de grasa, como leche, yogur y queso.  Cereales, panes, pastas y arroz.  Elija cereales integrales, como panes multicereales, avena en grano y arroz integral. Estos alimentos pueden ayudar a controlar la presin arterial.  Grasas.  Alimentos que contengan grasas saludables, como frutos secos, aguacate, aceite de oliva, aceite de canola y pescado. TODOS LOS QUE PADECEN DIABETES MELLITUS TIENEN EL MISMO PLAN DE COMIDAS? Dado que todas las personas que padecen diabetes mellitus son distintas, no hay un solo plan de comidas que funcione para todos. Es muy importante que se rena con un nutricionista que lo ayudar a crear un plan de comidas adecuado para usted. Esta informacin no tiene como fin reemplazar el consejo del mdico. Asegrese de hacerle al mdico cualquier pregunta que tenga. Document Released: 06/18/2007 Document Revised: 04/01/2014 Document Reviewed: 02/05/2013 Elsevier Interactive Patient Education  2017 Elsevier Inc.  

## 2016-06-12 NOTE — Progress Notes (Signed)
Medication refills- everything Meter not working well at home

## 2016-06-13 NOTE — Progress Notes (Signed)
Subjective:  Patient ID: Carmen Burns, female    DOB: 06/24/1960  Age: 55 y.o. MRN: [id number]  CC: Diabetes; Fall (Saturday 06/08/16  doesn't remember why- she may have fainted); Dental Pain; and Knee Pain (right side)   HPI Carmen Burns is a 56 year old female with a history of type 2 diabetes mellitus (A1c 6.6), diabetic neuropathy, hypertension, hyperlipidemia who presents today for follow-up visit.  Janumet was discontinued due to GI side effects and the dose of her glipizide was increased. She states that fasting sugars have been in the 150-160 range and denies hypoglycemia.  She has been compliant with her antihypertensive. Denies side effects from them; neuropathy is controlled on gabapentin.  She took a fall 4 days ago and sustained a bruise to her right knee hitting her teeth on the floor. She is unable to recall what led to the fall and denies blurry vision, lightheadedness prior to fall.  Past Medical History:  Diagnosis Date  . Anemia 2013  . Diabetes mellitus without complication ([place])   . Hyperlipidemia   . Hypertension   . Refusal of blood transfusions as patient is Jehovah's Witness     Past Surgical History:  Procedure Laterality Date  . APPENDECTOMY    . DILITATION & CURRETTAGE/HYSTROSCOPY WITH NOVASURE ABLATION N/A 01/12/2013   Procedure: DILATATION & CURETTAGE/HYSTEROSCOPY WITH NOVASURE ABLATION;  Surgeon: Osborne Oman, MD;  Location: [facility];  Service: Gynecology;  Laterality: N/A;    Allergies  Allergen Reactions  . Penicillins Itching and Rash     Outpatient Medications Prior to Visit  Medication Sig Dispense Refill  . Blood Glucose Monitoring Suppl (TRUE METRIX METER) W/DEVICE KIT USE TO CHECK BLOOD SUGAR THREE TIMES DAILY AND AT BEDTIME 1 kit 0  . glucose blood test strip USE TO CHECK BLOOD SUGAR THREE TIMES DAILY AND AT BEDTIME 100 each 12  . naproxen sodium (ANAPROX) 550 MG tablet TAKE 1 TABLET BY MOUTH 2 TIMES DAILY WITH A MEAL FOR  PAIN 30 tablet 2  . polyethylene glycol powder (GLYCOLAX/MIRALAX) powder Take 17 g by mouth 2 (two) times daily as needed. 3350 g 1  . TRUEPLUS LANCETS 28G MISC USE TO CHECK BLOOD SUGAR THREE TIMES DAILY AND AT BEDTIME 100 each 2  . atorvastatin (LIPITOR) 20 MG tablet Take 1 tablet (20 mg total) by mouth daily. 30 tablet 5  . cyclobenzaprine (FLEXERIL) 10 MG tablet Take 1 tablet (10 mg total) by mouth 3 (three) times daily as needed for muscle spasms. 30 tablet 0  . gabapentin (NEURONTIN) 300 MG capsule Take 1 capsule (300 mg total) by mouth at bedtime. 30 capsule 5  . glipiZIDE (GLUCOTROL) 10 MG tablet Take 1 tablet (10 mg total) by mouth 2 (two) times daily before a meal. 60 tablet 5  . lisinopril-hydrochlorothiazide (PRINZIDE,ZESTORETIC) 10-12.5 MG tablet Take 1 tablet by mouth daily. 30 tablet 5   Facility-Administered Medications Prior to Visit  Medication Dose Route Frequency Provider Last Rate Last Dose  . 0.9 %  sodium chloride infusion  500 mL Intravenous Continuous Manus Gunning, MD        ROS Review of Systems  Constitutional: Negative for activity change, appetite change and fatigue.  HENT: Positive for dental problem. Negative for congestion, sinus pressure and sore throat.   Eyes: Negative for visual disturbance.  Respiratory: Negative for cough, chest tightness, shortness of breath and wheezing.   Cardiovascular: Negative for chest pain and palpitations.  Gastrointestinal: Negative for abdominal distention, abdominal pain and constipation.  Endocrine: Negative for polydipsia.  Genitourinary: Negative for dysuria and frequency.  Musculoskeletal: Negative for arthralgias and back pain.  Skin: Positive for wound. Negative for rash.  Neurological: Negative for tremors, light-headedness and numbness.  Hematological: Does not bruise/bleed easily.  Psychiatric/Behavioral: Negative for agitation and behavioral problems.    Objective:  BP 128/85 (BP Location: Right Arm,  Patient Position: Sitting, Cuff Size: Small)   Pulse 91   Temp 98.1 F (36.7 C) (Oral)   Ht 5' 3"  (1.6 m)   Wt 213 lb 12.8 oz (97 kg)   SpO2 99%   BMI 37.87 kg/m   BP/Weight 06/12/2016 02/20/2016 62/11/4763  Systolic BP 465 035 465  Diastolic BP 85 84 79  Wt. (Lbs) 213.8 202 206  BMI 37.87 34.67 35.36      Physical Exam  Constitutional: She is oriented to person, place, and time. She appears well-developed and well-nourished.  Cardiovascular: Normal rate, normal heart sounds and intact distal pulses.   No murmur heard. Pulmonary/Chest: Effort normal and breath sounds normal. She has no wheezes. She has no rales. She exhibits no tenderness.  Abdominal: Soft. Bowel sounds are normal. She exhibits no distension and no mass. There is no tenderness.  Musculoskeletal: Normal range of motion.  Neurological: She is alert and oriented to person, place, and time.  Skin:  Bruise on right knee which is erythematous but no active bleeding  Psychiatric: She has a normal mood and affect.     Lipid Panel     Component Value Date/Time   CHOL 163 01/19/2016 1043   TRIG 157 (H) 01/19/2016 1043   HDL 51 01/19/2016 1043   CHOLHDL 3.2 01/19/2016 1043   VLDL 31 (H) 01/19/2016 1043   LDLCALC 81 01/19/2016 1043    Lab Results  Component Value Date   HGBA1C 8.4 06/12/2016    Assessment & Plan:   1. Essential hypertension Controlled - lisinopril-hydrochlorothiazide (PRINZIDE,ZESTORETIC) 10-12.5 MG tablet; Take 1 tablet by mouth daily.  Dispense: 30 tablet; Refill: 5 - COMPLETE METABOLIC PANEL WITH GFR - Microalbumin / creatinine urine ratio  2. Pure hypercholesterolemia Controlled Low-cholesterol diet - atorvastatin (LIPITOR) 20 MG tablet; Take 1 tablet (20 mg total) by mouth daily.  Dispense: 30 tablet; Refill: 5  3. Type 2 diabetes mellitus with complication, without long-term current use of insulin (HCC) A1c of 8.4 Januvia added to regimen  - Glucose (CBG) - HgB A1c -  glipiZIDE (GLUCOTROL) 10 MG tablet; Take 1 tablet (10 mg total) by mouth 2 (two) times daily before a meal.  Dispense: 60 tablet; Refill: 5  4. Other diabetic neurological complication associated with type 2 diabetes mellitus (HCC) Stable - gabapentin (NEURONTIN) 300 MG capsule; Take 1 capsule (300 mg total) by mouth at bedtime.  Dispense: 30 capsule; Refill: 5  5. Contusion of left knee, initial encounter Advised to keep contrition clean - Tdap vaccine greater than or equal to 7yo IM  6. Need for Tdap vaccination - Tdap vaccine greater than or equal to 7yo IM   Meds ordered this encounter  Medications  . lisinopril-hydrochlorothiazide (PRINZIDE,ZESTORETIC) 10-12.5 MG tablet    Sig: Take 1 tablet by mouth daily.    Dispense:  30 tablet    Refill:  5  . atorvastatin (LIPITOR) 20 MG tablet    Sig: Take 1 tablet (20 mg total) by mouth daily.    Dispense:  30 tablet    Refill:  5  . glipiZIDE (GLUCOTROL) 10 MG tablet    Sig: Take 1  tablet (10 mg total) by mouth 2 (two) times daily before a meal.    Dispense:  60 tablet    Refill:  5  . gabapentin (NEURONTIN) 300 MG capsule    Sig: Take 1 capsule (300 mg total) by mouth at bedtime.    Dispense:  30 capsule    Refill:  5  . sitaGLIPtin (JANUVIA) 100 MG tablet    Sig: Take 1 tablet (100 mg total) by mouth daily.    Dispense:  30 tablet    Refill:  5  . mupirocin cream (BACTROBAN) 2 %    Sig: Apply 1 application topically 2 (two) times daily.    Dispense:  30 g    Refill:  0    Follow-up: Return in about 3 months (around 09/12/2016) for follow up on Diabetes.   Arnoldo Morale MD

## 2016-06-17 ENCOUNTER — Other Ambulatory Visit: Payer: Self-pay | Admitting: Family Medicine

## 2016-06-17 MED ORDER — MUPIROCIN 2 % EX OINT: 1.0000 "application " | TOPICAL_OINTMENT | Freq: Two times a day (BID) | CUTANEOUS | 1 refills | Status: DC

## 2016-07-22 ENCOUNTER — Ambulatory Visit: Payer: Self-pay | Admitting: Family Medicine

## 2016-07-22 ENCOUNTER — Telehealth: Payer: Self-pay | Admitting: Family Medicine

## 2016-07-22 NOTE — Telephone Encounter (Signed)
Yes.  She needs an appointment

## 2016-07-22 NOTE — Telephone Encounter (Signed)
Scheduler made appt to see Dr. Jarold Song tomorrow regarding her medication management.

## 2016-07-22 NOTE — Telephone Encounter (Signed)
Pt. Came to facility stating that she used to take Janumet and that it made her depressed. Pt. States that she was prescribed Januvia and she states that it is not helping her. Please f/u with pt.

## 2016-07-23 ENCOUNTER — Ambulatory Visit: Payer: Self-pay | Attending: Family Medicine | Admitting: Family Medicine

## 2016-07-23 ENCOUNTER — Encounter: Payer: Self-pay | Admitting: Family Medicine

## 2016-07-23 VITALS — BP 119/79 | HR 97 | Temp 98.3°F | Resp 16 | Ht 64.0 in | Wt 213.0 lb

## 2016-07-23 DIAGNOSIS — E1140 Type 2 diabetes mellitus with diabetic neuropathy, unspecified: Secondary | ICD-10-CM | POA: Insufficient documentation

## 2016-07-23 DIAGNOSIS — Z880 Allergy status to penicillin: Secondary | ICD-10-CM | POA: Insufficient documentation

## 2016-07-23 DIAGNOSIS — Z7984 Long term (current) use of oral hypoglycemic drugs: Secondary | ICD-10-CM | POA: Insufficient documentation

## 2016-07-23 DIAGNOSIS — E118 Type 2 diabetes mellitus with unspecified complications: Secondary | ICD-10-CM

## 2016-07-23 DIAGNOSIS — D649 Anemia, unspecified: Secondary | ICD-10-CM | POA: Insufficient documentation

## 2016-07-23 DIAGNOSIS — E785 Hyperlipidemia, unspecified: Secondary | ICD-10-CM | POA: Insufficient documentation

## 2016-07-23 DIAGNOSIS — I10 Essential (primary) hypertension: Secondary | ICD-10-CM | POA: Insufficient documentation

## 2016-07-23 DIAGNOSIS — K5900 Constipation, unspecified: Secondary | ICD-10-CM | POA: Insufficient documentation

## 2016-07-23 DIAGNOSIS — Z9889 Other specified postprocedural states: Secondary | ICD-10-CM | POA: Insufficient documentation

## 2016-07-23 LAB — GLUCOSE, POCT (MANUAL RESULT ENTRY): POC Glucose: 254 mg/dl — AB (ref 70–99)

## 2016-07-23 MED ORDER — METFORMIN HCL 500 MG PO TABS: 500.0000 mg | ORAL_TABLET | Freq: Two times a day (BID) | ORAL | 3 refills | Status: DC

## 2016-07-23 NOTE — Progress Notes (Signed)
Subjective:  Patient ID: Carmen Burns, female    DOB: Apr 20, 1960  Age: 56 y.o. MRN: [id number]  CC: Diabetes   HPI [name] s a 56 year old female with a history of type 2 diabetes mellitus (A1c 8.4 up from 6.6), diabetic neuropathy, hypertension, hyperlipidemia who presents today for follow-up visit.  She had called complaining of high sugars. Was previously on Janumet and had complained of constipation and this was discontinued and replaced with Januvia and glipizide. On further questioning she has only been taking Januvia and not glipizide. Blood sugars and the 160-180 range fasting.  Past Medical History:  Diagnosis Date  . Anemia 2013  . Diabetes mellitus without complication ([place])   . Hyperlipidemia   . Hypertension   . Refusal of blood transfusions as patient is Jehovah's Witness     Past Surgical History:  Procedure Laterality Date  . APPENDECTOMY    . DILITATION & CURRETTAGE/HYSTROSCOPY WITH NOVASURE ABLATION N/A 01/12/2013   Procedure: DILATATION & CURETTAGE/HYSTEROSCOPY WITH NOVASURE ABLATION;  Surgeon: Osborne Oman, MD;  Location: [facility];  Service: Gynecology;  Laterality: N/A;    Allergies  Allergen Reactions  . Penicillins Itching and Rash     Outpatient Medications Prior to Visit  Medication Sig Dispense Refill  . atorvastatin (LIPITOR) 20 MG tablet Take 1 tablet (20 mg total) by mouth daily. 30 tablet 5  . Blood Glucose Monitoring Suppl (TRUE METRIX METER) W/DEVICE KIT USE TO CHECK BLOOD SUGAR THREE TIMES DAILY AND AT BEDTIME 1 kit 0  . gabapentin (NEURONTIN) 300 MG capsule Take 1 capsule (300 mg total) by mouth at bedtime. 30 capsule 5  . glucose blood test strip USE TO CHECK BLOOD SUGAR THREE TIMES DAILY AND AT BEDTIME 100 each 12  . lisinopril-hydrochlorothiazide (PRINZIDE,ZESTORETIC) 10-12.5 MG tablet Take 1 tablet by mouth daily. 30 tablet 5  . naproxen sodium (ANAPROX) 550 MG tablet TAKE 1 TABLET BY MOUTH 2 TIMES DAILY WITH A MEAL FOR  PAIN 30 tablet 2  . polyethylene glycol powder (GLYCOLAX/MIRALAX) powder Take 17 g by mouth 2 (two) times daily as needed. 3350 g 1  . sitaGLIPtin (JANUVIA) 100 MG tablet Take 1 tablet (100 mg total) by mouth daily. 30 tablet 5  . TRUEPLUS LANCETS 28G MISC USE TO CHECK BLOOD SUGAR THREE TIMES DAILY AND AT BEDTIME 100 each 2  . glipiZIDE (GLUCOTROL) 10 MG tablet Take 1 tablet (10 mg total) by mouth 2 (two) times daily before a meal. (Patient not taking: Reported on 07/23/2016) 60 tablet 5  . mupirocin ointment (BACTROBAN) 2 % Place 1 application into the nose 2 (two) times daily. (Patient not taking: Reported on 07/23/2016) 22 g 1   Facility-Administered Medications Prior to Visit  Medication Dose Route Frequency Provider Last Rate Last Dose  . 0.9 %  sodium chloride infusion  500 mL Intravenous Continuous Manus Gunning, MD        ROS Review of Systems  Constitutional: Negative for activity change and appetite change.  HENT: Negative for sinus pressure and sore throat.   Respiratory: Negative for chest tightness, shortness of breath and wheezing.   Cardiovascular: Negative for chest pain and palpitations.  Gastrointestinal: Negative for abdominal distention, abdominal pain and constipation.  Genitourinary: Negative.   Musculoskeletal: Negative.   Psychiatric/Behavioral: Negative for behavioral problems and dysphoric mood.    Objective:  BP 119/79 (BP Location: Right Arm, Patient Position: Sitting, Cuff Size: Large)   Pulse 97   Temp 98.3 F (36.8 C) (Oral)  Resp 16   Ht 5' 4"  (1.626 m)   Wt 213 lb (96.6 kg)   SpO2 99%   BMI 36.56 kg/m   BP/Weight 07/23/2016 06/12/2016 00/34/9179  Systolic BP 150 569 794  Diastolic BP 79 85 84  Wt. (Lbs) 213 213.8 202  BMI 36.56 37.87 34.67      Physical Exam  Constitutional: She is oriented to person, place, and time. She appears well-developed and well-nourished.  Cardiovascular: Normal rate, normal heart sounds and intact distal  pulses.   No murmur heard. Pulmonary/Chest: Effort normal and breath sounds normal. She has no wheezes. She has no rales. She exhibits no tenderness.  Abdominal: Soft. Bowel sounds are normal. She exhibits no distension and no mass. There is no tenderness.  Musculoskeletal: Normal range of motion.  Neurological: She is alert and oriented to person, place, and time.    Lab Results  Component Value Date   HGBA1C 8.4 06/12/2016    Assessment & Plan:   1. Type 2 diabetes mellitus with complication, without long-term current use of insulin (HCC) Uncontrolled with A1c of 8.4 Advised to resume glipizide along with Januvia In the event of hypoglycemia patient advised to reduce glipizide from 10 mg to 5 mg twice daily. Diabetic diet - POCT glucose (manual entry)   Meds ordered this encounter  Medications  . DISCONTD: metFORMIN (GLUCOPHAGE) 500 MG tablet    Sig: Take 1 tablet (500 mg total) by mouth 2 (two) times daily with a meal.    Dispense:  60 tablet    Refill:  3    Follow-up: Return in about 6 weeks (around 09/03/2016) for Follow-up on diabetes mellitus.   Arnoldo Morale MD

## 2016-07-23 NOTE — Patient Instructions (Signed)
Diabetes mellitus y [place] fsica (Diabetes Mellitus and Exercise) Hacer actividad fsica habitualmente es importante para el estado de salud general, en especial si tiene diabetes (diabetes mellitus). La actividad fsica no solo se reduce a Pharmacist, hospital. Aporta muchos beneficios para la salud, como aumentar la fuerza muscular y la densidad sea, y reducir las grasas corporales y Dealer. Esto mejora el estado fsico, la flexibilidad y la resistencia, y todo ello redunda en un mejor estado de salud general. La actividad fsica tiene beneficios adicionales para los diabticos, entre ellos:  Disminuye el apetito.  Ayuda a bajar y Consulting civil engineer glucemia bajo control.  Baja la presin arterial.  Ayuda a controlar las cantidades de sustancias grasas (lpidos) en la [place], como el colesterol y los triglicridos.  Mejora la respuesta del cuerpo a la insulina (optimizacin de la sensibilidad a la insulina).  Reduce la cantidad de insulina que el cuerpo necesita.  Reduce el riesgo de sufrir cardiopata coronaria de la siguiente forma:  [employer] de colesterol y triglicridos.  Aumenta los niveles de colesterol bueno.  Disminuye la glucemia. QU ES EL PLAN DE ACTIVIDADES? El mdico o un educador para la diabetes certificado pueden ayudarlo a Engineer, petroleum del tipo y de la frecuencia de actividad fsica (plan de actividades) adecuado para usted. Asegrese de lo siguiente:  Haga por lo menos 115minutos semanales de ejercicios de intensidad moderada o vigorosa. Estos podran ser caminatas dinmicas, ciclismo o [place].  Haga ejercicios de elongacin y de fortalecimiento, como yoga o levantamiento de pesas, por lo menos 2veces por semana.  Reparta la actividad en al menos 3das de la semana.  Haga algn tipo de actividad fsica [employer].  No deje pasar ms de 2das seguidos sin hacer algn tipo de actividad fsica.  No permanezca inactivo durante  ms de 63minutos seguidos. Tmese descansos frecuentes para caminar o estirarse.  Elija un tipo de ejercicio o de actividad que disfrute y establezca objetivos realistas.  Comience lentamente y aumente de [place] gradual la intensidad del ejercicio con el correr del [place]. QU DEBO SABER ACERCA DEL CONTROL DE LA DIABETES?  Contrlese la glucemia antes y despus de ejercitarse.  Si la glucemia supera los 240mg /dl (13,65mmol/l) antes de ejercitarse, hgase un control de la orina para detectar la presencia de cetonas. Si tiene [employer] orina, no se ejercite hasta que la glucemia se normalice.  Conozca los sntomas de la glucemia baja (hipoglucemia) y aprenda cmo tratarla. El riesgo de tener hipoglucemia [place] durante y despus de hacer actividad fsica. Los sntomas frecuentes de hipoglucemia pueden incluir los siguientes:  [place].  Ansiedad.  Sudoracin y piel hmeda.  Confusin.  Mareos o sensacin de desvanecimiento.  Aumento de la frecuencia cardaca o palpitaciones.  Visin borrosa.  Hormigueo o adormecimiento alrededor [employer], los labios o la [place].  Temblores o sacudidas.  Irritabilidad.  Tenga una colacin de carbohidratos de accin rpida disponible antes, durante y despus de ejercitarse, a fin de evitar o tratar la hipoglucemia.  Evite inyectarse insulina en las zonas del cuerpo que ejercitar. Por ejemplo, evite inyectarse insulina en:  Los brazos, si juega al tenis.  Las piernas, si corre.  Lleve registros de sus hbitos de actividad fsica. Esto puede ayudarlos a usted y al mdico a Tax adviser de control de la diabetes segn sea necesario. Escriba los siguientes datos:  Los alimentos que consume antes y despus de Field seismologist actividad fsica.  Los niveles de glucosa en  la sangre antes y despus de hacer [place].  El tipo y cantidad de [place] fsica que Musician.  Cuando se prev que la insulina alcance su valor mximo, si [place] insulina.  No haga actividad fsica en los momentos en que insulina alcanza su valor mximo.  Cuando comience un ejercicio o una actividad nuevos, trabaje con el mdico para asegurarse de que la actividad sea segura para usted y para Secretary/administrator la [place], los medicamentos o la ingesta de alimentos segn sea necesario.  Beba gran cantidad de agua mientras hace ejercicios para evitar la deshidratacin o los golpes de Freight forwarder. Beba suficiente lquido para Consulting civil engineer orina clara o de color amarillo plido. Esta informacin no tiene Marine scientist el consejo del mdico. Asegrese de hacerle al mdico cualquier pregunta que tenga. Document Released: 03/31/2007 Document Revised: 07/03/2015 Document Reviewed: 08/21/2015 Elsevier Interactive Patient Education  2017 [employer].

## 2016-07-23 NOTE — Progress Notes (Signed)
  F/U DM, pt fasting  Pt stated glucose still running Hi  Glucose running  166-186 No pain today  No tobacco user  No suicidal thoughts in the past two weeks

## 2016-10-07 ENCOUNTER — Other Ambulatory Visit: Payer: Self-pay | Admitting: Family Medicine

## 2016-10-07 DIAGNOSIS — E1149 Type 2 diabetes mellitus with other diabetic neurological complication: Secondary | ICD-10-CM

## 2016-11-19 ENCOUNTER — Other Ambulatory Visit: Payer: Self-pay | Admitting: Family Medicine

## 2016-11-19 DIAGNOSIS — Z1231 Encounter for screening mammogram for malignant neoplasm of breast: Secondary | ICD-10-CM

## 2016-11-27 ENCOUNTER — Encounter: Payer: Self-pay | Admitting: Physician Assistant

## 2016-11-27 ENCOUNTER — Ambulatory Visit: Payer: Self-pay | Attending: Family Medicine | Admitting: Physician Assistant

## 2016-11-27 VITALS — BP 121/81 | HR 94 | Temp 98.2°F | Resp 18 | Ht 65.0 in | Wt 213.4 lb

## 2016-11-27 DIAGNOSIS — I10 Essential (primary) hypertension: Secondary | ICD-10-CM | POA: Insufficient documentation

## 2016-11-27 DIAGNOSIS — Z7984 Long term (current) use of oral hypoglycemic drugs: Secondary | ICD-10-CM | POA: Insufficient documentation

## 2016-11-27 DIAGNOSIS — E785 Hyperlipidemia, unspecified: Secondary | ICD-10-CM | POA: Insufficient documentation

## 2016-11-27 DIAGNOSIS — E870 Hyperosmolality and hypernatremia: Secondary | ICD-10-CM | POA: Insufficient documentation

## 2016-11-27 DIAGNOSIS — E119 Type 2 diabetes mellitus without complications: Secondary | ICD-10-CM | POA: Insufficient documentation

## 2016-11-27 DIAGNOSIS — E0800 Diabetes mellitus due to underlying condition with hyperosmolarity without nonketotic hyperglycemic-hyperosmolar coma (NKHHC): Secondary | ICD-10-CM

## 2016-11-27 LAB — POCT GLYCOSYLATED HEMOGLOBIN (HGB A1C): HEMOGLOBIN A1C: 10.2

## 2016-11-27 LAB — GLUCOSE, POCT (MANUAL RESULT ENTRY): POC GLUCOSE: 318 mg/dL — AB (ref 70–99)

## 2016-11-27 MED ORDER — METFORMIN HCL ER 500 MG PO TB24: ORAL_TABLET | ORAL | 0 refills | Status: DC

## 2016-11-27 NOTE — Progress Notes (Signed)
Patient ID: Carmen Burns, female   DOB: [birth year], 56 y.o.   MRN: [id number]

## 2016-11-27 NOTE — Progress Notes (Signed)
Carmen Burns, is a 56 y.o. female  PPI:[id number]  AYT:[id number]  DOB - [birth year]  Subjective:  Chief Complaint and HPI: Carmen Burns is a 56 y.o. female here today for blood sugar check.  Her blood sugars at home running in the 200s but she doesn't have them with her and can't give me a number range.  She is taking glipizide in the morning and afternoon and Januvia at bedtime.  She wants to try metformin again because she says her blood sugars were much better controlled when she was taking this previously.  She denies any s/sx assc with hyperglycemia.  No hypoglycemia.  Carmen Burns with [employer].  ROS:   Constitutional:  No f/c, No night sweats, No unexplained weight loss. EENT:  No vision changes, No blurry vision, No hearing changes. No mouth, throat, or ear problems.  Respiratory: No cough, No SOB Cardiac: No CP, no palpitations GI:  No abd pain, No N/V/D. GU: No Urinary s/sx Musculoskeletal: No joint pain Neuro: No headache, no dizziness, no motor weakness.  Skin: No rash Endocrine:  No polydipsia. No polyuria.  Psych: Denies SI/HI  No problems updated.  ALLERGIES: Allergies  Allergen Reactions  . Penicillins Itching and Rash    PAST MEDICAL HISTORY: Past Medical History:  Diagnosis Date  . Anemia 2013  . Diabetes mellitus without complication ([place])   . Hyperlipidemia   . Hypertension   . Refusal of blood transfusions as patient is Jehovah's Witness     MEDICATIONS AT HOME: Prior to Admission medications   Medication Sig Start Date End Date Taking? Authorizing Provider  atorvastatin (LIPITOR) 20 MG tablet Take 1 tablet (20 mg total) by mouth daily. 06/12/16   Arnoldo Morale, MD  Blood Glucose Monitoring Suppl (TRUE METRIX METER) W/DEVICE KIT USE TO CHECK BLOOD SUGAR THREE TIMES DAILY AND AT BEDTIME 03/16/15   Lance Bosch, NP  gabapentin (NEURONTIN) 300 MG capsule Take 1 capsule (300 mg total) by mouth at bedtime. 06/12/16   Arnoldo Morale, MD    gabapentin (NEURONTIN) 300 MG capsule TAKE 1 CAPSULE BY MOUTH AT BEDTIME. 10/07/16   Arnoldo Morale, MD  glipiZIDE (GLUCOTROL) 10 MG tablet Take 1 tablet (10 mg total) by mouth 2 (two) times daily before a meal. Patient not taking: Reported on 07/23/2016 06/12/16   Arnoldo Morale, MD  glucose blood test strip USE TO CHECK BLOOD SUGAR THREE TIMES DAILY AND AT BEDTIME 05/03/16   Arnoldo Morale, MD  lisinopril-hydrochlorothiazide (PRINZIDE,ZESTORETIC) 10-12.5 MG tablet Take 1 tablet by mouth daily. 06/12/16   Arnoldo Morale, MD  metFORMIN (GLUCOPHAGE-XR) 500 MG 24 hr tablet 2 tabs daily 11/27/16   Freeman Caldron M, PA-C  mupirocin ointment (BACTROBAN) 2 % Place 1 application into the nose 2 (two) times daily. Patient not taking: Reported on 07/23/2016 06/17/16   Arnoldo Morale, MD  naproxen sodium (ANAPROX) 550 MG tablet TAKE 1 TABLET BY MOUTH 2 TIMES DAILY WITH A MEAL FOR PAIN 03/28/16   Arnoldo Morale, MD  polyethylene glycol powder (GLYCOLAX/MIRALAX) powder Take 17 g by mouth 2 (two) times daily as needed. 01/19/16   Arnoldo Morale, MD  sitaGLIPtin (JANUVIA) 100 MG tablet Take 1 tablet (100 mg total) by mouth daily. 06/12/16   Arnoldo Morale, MD  TRUEPLUS LANCETS 28G MISC USE TO CHECK BLOOD SUGAR THREE TIMES DAILY AND AT BEDTIME 03/16/15   Lance Bosch, NP     Objective:  EXAM:   Vitals:   11/27/16 1352  BP: 121/81  Pulse: 94  Resp:  18  Temp: 98.2 F (36.8 C)  TempSrc: Oral  SpO2: 97%  Weight: 213 lb 6.4 oz (96.8 kg)  Height: 5' 5"  (1.651 m)    General appearance : A&OX3. NAD. Non-toxic-appearing HEENT: Atraumatic and Normocephalic.  PERRLA. EOM intact.   Neck: supple, no JVD. No cervical lymphadenopathy. No thyromegaly Chest/Lungs:  Breathing-non-labored, Good air entry bilaterally, breath sounds normal without rales, rhonchi, or wheezing  CVS: S1 S2 regular, no murmurs, gallops, rubs  Extremities: Bilateral Lower Ext shows no edema, both legs are warm to touch with = pulse throughout Neurology:   CN II-XII grossly intact, Non focal.   Psych:  TP linear. J/I WNL. Normal speech. Appropriate eye contact and affect.  Skin:  No Rash  Data Review Lab Results  Component Value Date   HGBA1C 10.2 11/27/2016   HGBA1C 8.4 06/12/2016   HGBA1C 6.6 01/19/2016     Assessment & Plan   1. Diabetes mellitus due to underlying condition with hyperosmolarity without coma, without long-term current use of insulin (HCC) Discussed with Theda Sers- Uncontrolled- - Glucose (CBG) - HgB A1c Will try extended release to see if it causes less GI issues and because she wants to try it again.- metFORMIN (GLUCOPHAGE-XR) 500 MG 24 hr tablet; 2 tabs daily  Dispense: 60 tablet; Refill: 0 For now, continue Januvia once daily and glipizide 39m bid with food.   Record blood sugars fasting, lunchtime and bedtime and record and bring to next visit - Comprehensive metabolic panel  2. Hypertension, unspecified type Controlled. Continue current regimen.     Patient have been counseled extensively about nutrition and exercise  Return in about 3 weeks (around 12/18/2016) for STheda Sersfor medication titration then to f/up with Dr AJarold Songat subsequent visit..  The patient was given clear instructions to go to ER or return to medical center if symptoms don't improve, worsen or new problems develop. The patient verbalized understanding. The patient was told to call to get lab results if they haven't heard anything in the next week.     AFreeman Caldron PA-C CEye [facility] Jerseyand WFountainebleauGG. L. Garcia NSpencer  11/27/2016, 2:12 PMPatient ID: CShanterica Biehler female   DOB: 110-11-1960 56y.o.   MRN: [id number]

## 2016-11-27 NOTE — Patient Instructions (Signed)
Check blood sugars fasting, at lunch time, and at bedtime and record.  Bring this to your next

## 2016-11-28 LAB — COMPREHENSIVE METABOLIC PANEL
A/G RATIO: 1.3 (ref 1.2–2.2)
ALK PHOS: 146 IU/L — AB (ref 39–117)
ALT: 34 IU/L — AB (ref 0–32)
AST: 34 IU/L (ref 0–40)
Albumin: 4.3 g/dL (ref 3.5–5.5)
BILIRUBIN TOTAL: 0.3 mg/dL (ref 0.0–1.2)
BUN/Creatinine Ratio: 17 (ref 9–23)
BUN: 12 mg/dL (ref 6–24)
CHLORIDE: 96 mmol/L (ref 96–106)
CO2: 25 mmol/L (ref 20–29)
Calcium: 9.3 mg/dL (ref 8.7–10.2)
Creatinine, Ser: 0.72 mg/dL (ref 0.57–1.00)
GFR calc Af Amer: 108 mL/min/{1.73_m2} (ref >59)
GFR, EST NON AFRICAN AMERICAN: 93 mL/min/{1.73_m2} (ref >59)
GLOBULIN, TOTAL: 3.4 g/dL (ref 1.5–4.5)
Glucose: 301 mg/dL — ABNORMAL HIGH (ref 65–99)
POTASSIUM: 4.6 mmol/L (ref 3.5–5.2)
SODIUM: 135 mmol/L (ref 134–144)
Total Protein: 7.7 g/dL (ref 6.0–8.5)

## 2016-12-18 ENCOUNTER — Ambulatory Visit: Payer: Self-pay | Attending: Family Medicine | Admitting: Pharmacist

## 2016-12-18 DIAGNOSIS — E0800 Diabetes mellitus due to underlying condition with hyperosmolarity without nonketotic hyperglycemic-hyperosmolar coma (NKHHC): Secondary | ICD-10-CM | POA: Insufficient documentation

## 2016-12-18 DIAGNOSIS — E118 Type 2 diabetes mellitus with unspecified complications: Secondary | ICD-10-CM

## 2016-12-18 MED ORDER — METFORMIN HCL ER 500 MG PO TB24: 1000.0000 mg | ORAL_TABLET | Freq: Two times a day (BID) | ORAL | 2 refills | Status: DC

## 2016-12-18 NOTE — Patient Instructions (Addendum)
Thanks for coming to see [place]  Increase metformin to 2 tablets in the morning and 2 tablets in the evening.  Get a few blood sugars 2 hours after you eat  Come back in 2 weeks

## 2016-12-18 NOTE — Progress Notes (Signed)
    S:     Chief Complaint  Patient presents with  . Medication Management    Patient arrives in good spirits.  Presents for diabetes evaluation, education, and management at the request of Freeman Caldron. Patient was referred on 11/27/16.  Patient was last seen by Primary Care Provider on 07/23/16. In person interpreter present for visit.  Patient reports adherence with medications.  Current diabetes medications include: Januvia 100 mg daily in the morning, glipizide 10 mg BID, metformin XL 500 mg twice daily.   Patient reports mild GI upset with metformin but it is tolerable and better if she takes the metformin with food.  Patient denies hypoglycemic events.  Patient reported dietary habits: Eats 3 meals/day  Patient reported exercise habits: walks 1 hour each day   Patient denies nocturia.  Patient denies neuropathy. Patient denies visual changes. Patient reports self foot exams.    O:  Physical Exam   ROS   Lab Results  Component Value Date   HGBA1C 10.2 11/27/2016   There were no vitals filed for this visit.  Home fasting CBG: 120s-160s over last week, previously was 160s-180s 2 hour post-prandial/random CBG: none  A/P: Diabetes longstanding currently uncontrolled based on A1c of 10.2 but improving based on home CBGs. Patient denies hypoglycemic events and is able to verbalize appropriate hypoglycemia management plan. Patient reports adherence with medication. Control is suboptimal due to dietary indiscretion and sedentary lifestyle.  Continue Januvia 100 mg daily and glipizide 10 mg BID. Increase metformin XL to 1000 mg BID. Patient instructed to continue to take with food to prevent GI upset. Patient to start getting some post-prandial readings to see what her control is then.   Next A1C anticipated December 2018.    Written patient instructions provided.  Total time in face to face counseling 15 minutes.   Follow up with Dr. Jarold Song in 2 weeks.   Patient seen  with Waverly Ferrari, PharmD Candidate

## 2016-12-30 ENCOUNTER — Encounter: Payer: Self-pay | Admitting: Family Medicine

## 2016-12-30 ENCOUNTER — Ambulatory Visit: Payer: Self-pay | Attending: Family Medicine | Admitting: Family Medicine

## 2016-12-30 VITALS — BP 121/79 | HR 92 | Temp 97.5°F | Ht 65.0 in | Wt 212.4 lb

## 2016-12-30 DIAGNOSIS — K120 Recurrent oral aphthae: Secondary | ICD-10-CM

## 2016-12-30 DIAGNOSIS — Z7984 Long term (current) use of oral hypoglycemic drugs: Secondary | ICD-10-CM | POA: Insufficient documentation

## 2016-12-30 DIAGNOSIS — Z880 Allergy status to penicillin: Secondary | ICD-10-CM | POA: Insufficient documentation

## 2016-12-30 DIAGNOSIS — E870 Hyperosmolality and hypernatremia: Secondary | ICD-10-CM | POA: Insufficient documentation

## 2016-12-30 DIAGNOSIS — E1100 Type 2 diabetes mellitus with hyperosmolarity without nonketotic hyperglycemic-hyperosmolar coma (NKHHC): Secondary | ICD-10-CM | POA: Insufficient documentation

## 2016-12-30 DIAGNOSIS — E785 Hyperlipidemia, unspecified: Secondary | ICD-10-CM | POA: Insufficient documentation

## 2016-12-30 DIAGNOSIS — E7800 Pure hypercholesterolemia, unspecified: Secondary | ICD-10-CM | POA: Insufficient documentation

## 2016-12-30 DIAGNOSIS — Z23 Encounter for immunization: Secondary | ICD-10-CM

## 2016-12-30 DIAGNOSIS — E1149 Type 2 diabetes mellitus with other diabetic neurological complication: Secondary | ICD-10-CM

## 2016-12-30 DIAGNOSIS — E1140 Type 2 diabetes mellitus with diabetic neuropathy, unspecified: Secondary | ICD-10-CM | POA: Insufficient documentation

## 2016-12-30 DIAGNOSIS — E0800 Diabetes mellitus due to underlying condition with hyperosmolarity without nonketotic hyperglycemic-hyperosmolar coma (NKHHC): Secondary | ICD-10-CM

## 2016-12-30 DIAGNOSIS — I10 Essential (primary) hypertension: Secondary | ICD-10-CM | POA: Insufficient documentation

## 2016-12-30 LAB — GLUCOSE, POCT (MANUAL RESULT ENTRY): POC Glucose: 101 mg/dl — AB (ref 70–99)

## 2016-12-30 MED ORDER — LIDOCAINE VISCOUS 2 % MT SOLN: 10.0000 mL | Freq: Four times a day (QID) | OROMUCOSAL | 0 refills | Status: DC | PRN

## 2016-12-30 MED ORDER — LISINOPRIL-HYDROCHLOROTHIAZIDE 10-12.5 MG PO TABS: 1.0000 | ORAL_TABLET | Freq: Every day | ORAL | 5 refills | Status: DC

## 2016-12-30 MED ORDER — GLIPIZIDE 10 MG PO TABS: 10.0000 mg | ORAL_TABLET | Freq: Two times a day (BID) | ORAL | 5 refills | Status: DC

## 2016-12-30 MED ORDER — SITAGLIPTIN PHOSPHATE 100 MG PO TABS: 100.0000 mg | ORAL_TABLET | Freq: Every day | ORAL | 5 refills | Status: DC

## 2016-12-30 MED ORDER — ATORVASTATIN CALCIUM 20 MG PO TABS: 20.0000 mg | ORAL_TABLET | Freq: Every day | ORAL | 5 refills | Status: DC

## 2016-12-30 MED ORDER — GABAPENTIN 300 MG PO CAPS: 300.0000 mg | ORAL_CAPSULE | Freq: Every day | ORAL | 5 refills | Status: DC

## 2016-12-30 MED ORDER — METFORMIN HCL ER 500 MG PO TB24: 1000.0000 mg | ORAL_TABLET | Freq: Two times a day (BID) | ORAL | 5 refills | Status: DC

## 2016-12-30 MED ORDER — CETIRIZINE HCL 10 MG PO TABS: 10.0000 mg | ORAL_TABLET | Freq: Every day | ORAL | 3 refills | Status: DC

## 2016-12-30 NOTE — Patient Instructions (Signed)
Diabetes Mellitus and Food It is important for you to manage your blood sugar (glucose) level. Your blood glucose level can be greatly affected by what you eat. Eating healthier foods in the appropriate amounts throughout the day at about the same time each day will help you control your blood glucose level. It can also help slow or prevent worsening of your diabetes mellitus. Healthy eating may even help you improve the level of your blood pressure and reach or maintain a healthy weight. General recommendations for healthful eating and cooking habits include:  Eating meals and snacks regularly. Avoid going long periods of time without eating to lose weight.  Eating a diet that consists mainly of plant-based foods, such as fruits, vegetables, nuts, legumes, and whole grains.  Using low-heat cooking methods, such as baking, instead of high-heat cooking methods, such as deep frying.  Work with your dietitian to make sure you understand how to use the Nutrition Facts information on food labels. How can food affect me? Carbohydrates Carbohydrates affect your blood glucose level more than any other type of food. Your dietitian will help you determine how many carbohydrates to eat at each meal and teach you how to count carbohydrates. Counting carbohydrates is important to keep your blood glucose at a healthy level, especially if you are using insulin or taking certain medicines for diabetes mellitus. Alcohol Alcohol can cause sudden decreases in blood glucose (hypoglycemia), especially if you use insulin or take certain medicines for diabetes mellitus. Hypoglycemia can be a life-threatening condition. Symptoms of hypoglycemia (sleepiness, dizziness, and disorientation) are similar to symptoms of having too much alcohol. If your health care provider has given you approval to drink alcohol, do so in moderation and use the following guidelines:  Women should not have more than one drink per day, and men  should not have more than two drinks per day. One drink is equal to: ? 12 oz of beer. ? 5 oz of wine. ? 1 oz of hard liquor.  Do not drink on an empty stomach.  Keep yourself hydrated. Have water, diet soda, or unsweetened iced tea.  Regular soda, juice, and other mixers might contain a lot of carbohydrates and should be counted.  What foods are not recommended? As you make food choices, it is important to remember that all foods are not the same. Some foods have fewer nutrients per serving than other foods, even though they might have the same number of calories or carbohydrates. It is difficult to get your body what it needs when you eat foods with fewer nutrients. Examples of foods that you should avoid that are high in calories and carbohydrates but low in nutrients include:  Trans fats (most processed foods list trans fats on the Nutrition Facts label).  Regular soda.  Juice.  Candy.  Sweets, such as cake, pie, doughnuts, and cookies.  Fried foods.  What foods can I eat? Eat nutrient-rich foods, which will nourish your body and keep you healthy. The food you should eat also will depend on several factors, including:  The calories you need.  The medicines you take.  Your weight.  Your blood glucose level.  Your blood pressure level.  Your cholesterol level.  You should eat a variety of foods, including:  Protein. ? Lean cuts of meat. ? Proteins low in saturated fats, such as fish, egg whites, and beans. Avoid processed meats.  Fruits and vegetables. ? Fruits and vegetables that may help control blood glucose levels, such as apples,   mangoes, and yams.  Dairy products. ? Choose fat-free or low-fat dairy products, such as milk, yogurt, and cheese.  Grains, bread, pasta, and rice. ? Choose whole grain products, such as multigrain bread, whole oats, and brown rice. These foods may help control blood pressure.  Fats. ? Foods containing healthful fats, such as  nuts, avocado, olive oil, canola oil, and fish.  Does everyone with diabetes mellitus have the same meal plan? Because every person with diabetes mellitus is different, there is not one meal plan that works for everyone. It is very important that you meet with a dietitian who will help you create a meal plan that is just right for you. This information is not intended to replace advice given to you by your health care provider. Make sure you discuss any questions you have with your health care provider. Document Released: 12/06/2004 Document Revised: 08/17/2015 Document Reviewed: 02/05/2013 Elsevier Interactive Patient Education  2017 [employer] Inc[employer]  

## 2016-12-30 NOTE — Progress Notes (Signed)
Subjective:  Patient ID: Carmen Burns, female    DOB: 13-Feb-1961  Age: 56 y.o. MRN: [id number]  CC: Diabetes   HPI Carmen Burns is a 56 year old female with a history of type 2 diabetes mellitus (A1c 10.2 up from 8.4), diabetic neuropathy, hypertension, hyperlipidemia who presents today for follow-up visit.  At her last visit with the clinical pharmacist, metformin dose had been increased and she reports that her fasting sugar this morning was 121; she denies any fasting sugars in the 300 range. Her diabetic neuropathy is controlled on her current dose of gabapentin.  She has been compliant with her antihypertensive and denies any side effects; she also tolerates her statin with no complaints of myalgia. She does not exercise regularly.  She complains of a burning sensation of her tongue especially with food and denies any recent fevers or sore throat or upper respiratory symptoms; She has no mouth ulcers.  She also endorses postnasal drip which is worse at night but denies sinus pressure.  Past Medical History:  Diagnosis Date  . Anemia 2013  . Diabetes mellitus without complication ([place])   . Hyperlipidemia   . Hypertension   . Refusal of blood transfusions as patient is Jehovah's Witness     Past Surgical History:  Procedure Laterality Date  . APPENDECTOMY    . DILITATION & CURRETTAGE/HYSTROSCOPY WITH NOVASURE ABLATION N/A 01/12/2013   Procedure: DILATATION & CURETTAGE/HYSTEROSCOPY WITH NOVASURE ABLATION;  Surgeon: Osborne Oman, MD;  Location: [facility];  Service: Gynecology;  Laterality: N/A;    Allergies  Allergen Reactions  . Penicillins Itching and Rash     Outpatient Medications Prior to Visit  Medication Sig Dispense Refill  . Blood Glucose Monitoring Suppl (TRUE METRIX METER) W/DEVICE KIT USE TO CHECK BLOOD SUGAR THREE TIMES DAILY AND AT BEDTIME 1 kit 0  . glucose blood test strip USE TO CHECK BLOOD SUGAR THREE TIMES DAILY AND AT BEDTIME 100 each 12  .  mupirocin ointment (BACTROBAN) 2 % Place 1 application into the nose 2 (two) times daily. 22 g 1  . polyethylene glycol powder (GLYCOLAX/MIRALAX) powder Take 17 g by mouth 2 (two) times daily as needed. 3350 g 1  . TRUEPLUS LANCETS 28G MISC USE TO CHECK BLOOD SUGAR THREE TIMES DAILY AND AT BEDTIME 100 each 2  . atorvastatin (LIPITOR) 20 MG tablet Take 1 tablet (20 mg total) by mouth daily. 30 tablet 5  . gabapentin (NEURONTIN) 300 MG capsule Take 1 capsule (300 mg total) by mouth at bedtime. 30 capsule 5  . gabapentin (NEURONTIN) 300 MG capsule TAKE 1 CAPSULE BY MOUTH AT BEDTIME. 30 capsule 2  . glipiZIDE (GLUCOTROL) 10 MG tablet Take 1 tablet (10 mg total) by mouth 2 (two) times daily before a meal. 60 tablet 5  . lisinopril-hydrochlorothiazide (PRINZIDE,ZESTORETIC) 10-12.5 MG tablet Take 1 tablet by mouth daily. 30 tablet 5  . metFORMIN (GLUCOPHAGE-XR) 500 MG 24 hr tablet Take 2 tablets (1,000 mg total) by mouth 2 (two) times daily. 2 tabs daily 120 tablet 2  . sitaGLIPtin (JANUVIA) 100 MG tablet Take 1 tablet (100 mg total) by mouth daily. 30 tablet 5  . naproxen sodium (ANAPROX) 550 MG tablet TAKE 1 TABLET BY MOUTH 2 TIMES DAILY WITH A MEAL FOR PAIN (Patient not taking: Reported on 12/30/2016) 30 tablet 2   Facility-Administered Medications Prior to Visit  Medication Dose Route Frequency Provider Last Rate Last Dose  . 0.9 %  sodium chloride infusion  500 mL Intravenous Continuous Fairview Cellar  P, MD        ROS Review of Systems  Constitutional: Negative for activity change, appetite change and fatigue.  HENT: Positive for postnasal drip. Negative for congestion, sinus pressure and sore throat.   Eyes: Negative for visual disturbance.  Respiratory: Negative for cough, chest tightness, shortness of breath and wheezing.   Cardiovascular: Negative for chest pain and palpitations.  Gastrointestinal: Negative for abdominal distention, abdominal pain and constipation.  Endocrine: Negative  for polydipsia.  Genitourinary: Negative for dysuria and frequency.  Musculoskeletal: Negative for arthralgias and back pain.  Skin: Negative for rash.  Neurological: Negative for tremors, light-headedness and numbness.  Hematological: Does not bruise/bleed easily.  Psychiatric/Behavioral: Negative for agitation and behavioral problems.    Objective:  BP 121/79   Pulse 92   Temp (!) 97.5 F (36.4 C) (Oral)   Ht 5' 5"  (1.651 m)   Wt 212 lb 6.4 oz (96.3 kg)   SpO2 99%   BMI 35.35 kg/m   BP/Weight 12/30/2016 08/31/879 1/0/3159  Systolic BP 458 592 924  Diastolic BP 79 81 79  Wt. (Lbs) 212.4 213.4 213  BMI 35.35 35.51 36.56     Physical Exam  Constitutional: She is oriented to person, place, and time. She appears well-developed and well-nourished.  HENT:  Right Ear: External ear normal.  Left Ear: External ear normal.  Mouth/Throat: Oropharynx is clear and moist.  Slightly enlarged papillae on the posterior tongue  Neck: No JVD present.  Cardiovascular: Normal rate, normal heart sounds and intact distal pulses.   No murmur heard. Pulmonary/Chest: Effort normal and breath sounds normal. She has no wheezes. She has no rales. She exhibits no tenderness.  Abdominal: Soft. Bowel sounds are normal. She exhibits no distension and no mass. There is no tenderness.  Musculoskeletal: Normal range of motion.  Neurological: She is alert and oriented to person, place, and time.       CMP Latest Ref Rng & Units 11/27/2016 01/19/2016 03/22/2015  Glucose 65 - 99 mg/dL 301(H) 113(H) 213(H)  BUN 6 - 24 mg/dL 12 14 11   Creatinine 0.57 - 1.00 mg/dL 0.72 0.88 0.71  Sodium 134 - 144 mmol/L 135 140 137  Potassium 3.5 - 5.2 mmol/L 4.6 4.4 4.3  Chloride 96 - 106 mmol/L 96 107 101  CO2 20 - 29 mmol/L 25 25 23   Calcium 8.7 - 10.2 mg/dL 9.3 9.4 9.5  Total Protein 6.0 - 8.5 g/dL 7.7 7.0 -  Total Bilirubin 0.0 - 1.2 mg/dL 0.3 0.5 -  Alkaline Phos 39 - 117 IU/L 146(H) 88 -  AST 0 - 40 IU/L 34 18 -   ALT 0 - 32 IU/L 34(H) 13 -    Lipid Panel     Component Value Date/Time   CHOL 163 01/19/2016 1043   TRIG 157 (H) 01/19/2016 1043   HDL 51 01/19/2016 1043   CHOLHDL 3.2 01/19/2016 1043   VLDL 31 (H) 01/19/2016 1043   LDLCALC 81 01/19/2016 1043    Lab Results  Component Value Date   HGBA1C 10.2 11/27/2016    Assessment & Plan:   1. Diabetes mellitus due to underlying condition with hyperosmolarity without coma, without long-term current use of insulin (HCC) Uncontrolled with A1c of 10.2 Blood sugar log reveals improvement; no regimen changed today Diabetic diet, lifestyle modification - POCT glucose (manual entry) - glipiZIDE (GLUCOTROL) 10 MG tablet; Take 1 tablet (10 mg total) by mouth 2 (two) times daily before a meal.  Dispense: 60 tablet; Refill: 5 -  metFORMIN (GLUCOPHAGE-XR) 500 MG 24 hr tablet; Take 2 tablets (1,000 mg total) by mouth 2 (two) times daily. 2 tabs daily  Dispense: 120 tablet; Refill: 5  2. Other diabetic neurological complication associated with type 2 diabetes mellitus (HCC) Controlled - gabapentin (NEURONTIN) 300 MG capsule; Take 1 capsule (300 mg total) by mouth at bedtime.  Dispense: 30 capsule; Refill: 5  3. Essential hypertension Controlled - lisinopril-hydrochlorothiazide (PRINZIDE,ZESTORETIC) 10-12.5 MG tablet; Take 1 tablet by mouth daily.  Dispense: 30 tablet; Refill: 5  4. Pure hypercholesterolemia Controlled - atorvastatin (LIPITOR) 20 MG tablet; Take 1 tablet (20 mg total) by mouth daily.  Dispense: 30 tablet; Refill: 5  5. Need for influenza vaccination - Flu Vaccine QUAD 36+ mos IM  6. Oral aphthous ulcer Placed on viscous lidocaine   Meds ordered this encounter  Medications  . gabapentin (NEURONTIN) 300 MG capsule    Sig: Take 1 capsule (300 mg total) by mouth at bedtime.    Dispense:  30 capsule    Refill:  5  . lisinopril-hydrochlorothiazide (PRINZIDE,ZESTORETIC) 10-12.5 MG tablet    Sig: Take 1 tablet by mouth daily.     Dispense:  30 tablet    Refill:  5  . glipiZIDE (GLUCOTROL) 10 MG tablet    Sig: Take 1 tablet (10 mg total) by mouth 2 (two) times daily before a meal.    Dispense:  60 tablet    Refill:  5  . sitaGLIPtin (JANUVIA) 100 MG tablet    Sig: Take 1 tablet (100 mg total) by mouth daily.    Dispense:  30 tablet    Refill:  5  . atorvastatin (LIPITOR) 20 MG tablet    Sig: Take 1 tablet (20 mg total) by mouth daily.    Dispense:  30 tablet    Refill:  5  . lidocaine (XYLOCAINE) 2 % solution    Sig: Use as directed 10 mLs in the mouth or throat every 6 (six) hours as needed for mouth pain.    Dispense:  100 mL    Refill:  0  . cetirizine (ZYRTEC) 10 MG tablet    Sig: Take 1 tablet (10 mg total) by mouth daily.    Dispense:  30 tablet    Refill:  3  . metFORMIN (GLUCOPHAGE-XR) 500 MG 24 hr tablet    Sig: Take 2 tablets (1,000 mg total) by mouth 2 (two) times daily. 2 tabs daily    Dispense:  120 tablet    Refill:  5    Follow-up: Return in about 3 months (around 04/01/2017) for Follow-up on diabetes mellitus.   Arnoldo Morale MD

## 2017-01-08 ENCOUNTER — Ambulatory Visit
Admission: RE | Admit: 2017-01-08 | Discharge: 2017-01-08 | Disposition: A | Discharge status: Home / Self Care | Payer: No Typology Code available for payment source | Source: Ambulatory Visit | Attending: Family Medicine | Admitting: Family Medicine

## 2017-01-08 DIAGNOSIS — Z1231 Encounter for screening mammogram for malignant neoplasm of breast: Secondary | ICD-10-CM

## 2017-01-10 ENCOUNTER — Telehealth: Payer: Self-pay

## 2017-01-10 NOTE — Telephone Encounter (Signed)
Pt was called and informed of lab results.  [id number].

## 2017-01-29 ENCOUNTER — Telehealth: Payer: Self-pay

## 2017-01-29 NOTE — Telephone Encounter (Signed)
-----   Message from Yetta Flock, MD sent at 01/27/2017  4:39 PM EST ----- Regarding: overdue colonoscopy Would you mind sending this patient a letter to contact us to schedule a colonoscopy - she has a difficult to remove polyp, has not followed up. Thanks

## 2017-01-29 NOTE — Telephone Encounter (Signed)
Letter mailed to patient reminding her to contact our office to schedule follow up colonoscopy.

## 2017-04-01 ENCOUNTER — Encounter: Payer: Self-pay | Admitting: Family Medicine

## 2017-04-01 ENCOUNTER — Ambulatory Visit: Payer: Self-pay | Attending: Family Medicine | Admitting: Family Medicine

## 2017-04-01 VITALS — BP 144/77 | HR 86 | Temp 98.0°F | Ht 65.0 in | Wt 213.8 lb

## 2017-04-01 DIAGNOSIS — I10 Essential (primary) hypertension: Secondary | ICD-10-CM | POA: Insufficient documentation

## 2017-04-01 DIAGNOSIS — Z7984 Long term (current) use of oral hypoglycemic drugs: Secondary | ICD-10-CM | POA: Insufficient documentation

## 2017-04-01 DIAGNOSIS — E0800 Diabetes mellitus due to underlying condition with hyperosmolarity without nonketotic hyperglycemic-hyperosmolar coma (NKHHC): Secondary | ICD-10-CM

## 2017-04-01 DIAGNOSIS — E1300 Other specified diabetes mellitus with hyperosmolarity without nonketotic hyperglycemic-hyperosmolar coma (NKHHC): Secondary | ICD-10-CM | POA: Insufficient documentation

## 2017-04-01 DIAGNOSIS — E1140 Type 2 diabetes mellitus with diabetic neuropathy, unspecified: Secondary | ICD-10-CM | POA: Insufficient documentation

## 2017-04-01 DIAGNOSIS — E7800 Pure hypercholesterolemia, unspecified: Secondary | ICD-10-CM | POA: Insufficient documentation

## 2017-04-01 DIAGNOSIS — E1149 Type 2 diabetes mellitus with other diabetic neurological complication: Secondary | ICD-10-CM

## 2017-04-01 DIAGNOSIS — E1165 Type 2 diabetes mellitus with hyperglycemia: Secondary | ICD-10-CM | POA: Insufficient documentation

## 2017-04-01 DIAGNOSIS — Z79899 Other long term (current) drug therapy: Secondary | ICD-10-CM | POA: Insufficient documentation

## 2017-04-01 DIAGNOSIS — Z1159 Encounter for screening for other viral diseases: Secondary | ICD-10-CM

## 2017-04-01 LAB — GLUCOSE, POCT (MANUAL RESULT ENTRY): POC Glucose: 100 mg/dl — AB (ref 70–99)

## 2017-04-01 LAB — POCT GLYCOSYLATED HEMOGLOBIN (HGB A1C): Hemoglobin A1C: 6.9

## 2017-04-01 MED ORDER — NAPROXEN SODIUM 550 MG PO TABS: ORAL_TABLET | ORAL | 2 refills | Status: DC

## 2017-04-01 MED ORDER — GLIPIZIDE 10 MG PO TABS: 10.0000 mg | ORAL_TABLET | Freq: Two times a day (BID) | ORAL | 5 refills | Status: DC

## 2017-04-01 MED ORDER — ATORVASTATIN CALCIUM 20 MG PO TABS: 20.0000 mg | ORAL_TABLET | Freq: Every day | ORAL | 5 refills | Status: DC

## 2017-04-01 MED ORDER — METFORMIN HCL ER 500 MG PO TB24: 1000.0000 mg | ORAL_TABLET | Freq: Two times a day (BID) | ORAL | 5 refills | Status: DC

## 2017-04-01 MED ORDER — SITAGLIPTIN PHOSPHATE 100 MG PO TABS: 100.0000 mg | ORAL_TABLET | Freq: Every day | ORAL | 5 refills | Status: DC

## 2017-04-01 MED ORDER — GABAPENTIN 300 MG PO CAPS: 300.0000 mg | ORAL_CAPSULE | Freq: Two times a day (BID) | ORAL | 5 refills | Status: DC

## 2017-04-01 MED ORDER — LISINOPRIL-HYDROCHLOROTHIAZIDE 10-12.5 MG PO TABS: 1.0000 | ORAL_TABLET | Freq: Every day | ORAL | 5 refills | Status: DC

## 2017-04-01 NOTE — Progress Notes (Signed)
 Subjective:  Patient ID: Carmen Burns, female    DOB: 12/30/1960  Age: 56 y.o. MRN: [id number]  CC: Diabetes   HPI Carmen Burns is a 56-year-old female with a history of type 2 diabetes mellitus (A1c 6.9), diabetic neuropathy, hypertension, hyperlipidemia who presents today for follow-up visit.  Her A1c 6.9 and has improved significantly from 10.2 previously and she denies hypoglycemia or blurry vision and has been compliant with her medications and a diabetic diet however she complains of worsening neuropathy in her fingers which she describes as numbness which is uncontrolled on her current dose of gabapentin-300 mg at bedtime. He has been unable to see ophthalmology for an annual eye exam due to lack of medical coverage.  She has been compliant with her antihypertensive and statin and denies myalgias or adverse effects. She is requesting a thyroid blood test has no other concerns today.   Past Medical History:  Diagnosis Date  . Anemia 2013  . Diabetes mellitus without complication (HCC)   . Hyperlipidemia   . Hypertension   . Refusal of blood transfusions as patient is Jehovah's Witness     Past Surgical History:  Procedure Laterality Date  . APPENDECTOMY    . DILITATION & CURRETTAGE/HYSTROSCOPY WITH NOVASURE ABLATION N/A 01/12/2013   Procedure: DILATATION & CURETTAGE/HYSTEROSCOPY WITH NOVASURE ABLATION;  Surgeon: Ugonna A Anyanwu, MD;  Location: [facility];  Service: Gynecology;  Laterality: N/A;    Allergies  Allergen Reactions  . Penicillins Itching and Rash     Outpatient Medications Prior to Visit  Medication Sig Dispense Refill  . Blood Glucose Monitoring Suppl (TRUE METRIX METER) W/DEVICE KIT USE TO CHECK BLOOD SUGAR THREE TIMES DAILY AND AT BEDTIME 1 kit 0  . cetirizine (ZYRTEC) 10 MG tablet Take 1 tablet (10 mg total) by mouth daily. 30 tablet 3  . glucose blood test strip USE TO CHECK BLOOD SUGAR THREE TIMES DAILY AND AT BEDTIME 100 each 12  . TRUEPLUS  LANCETS 28G MISC USE TO CHECK BLOOD SUGAR THREE TIMES DAILY AND AT BEDTIME 100 each 2  . atorvastatin (LIPITOR) 20 MG tablet Take 1 tablet (20 mg total) by mouth daily. 30 tablet 5  . gabapentin (NEURONTIN) 300 MG capsule Take 1 capsule (300 mg total) by mouth at bedtime. 30 capsule 5  . glipiZIDE (GLUCOTROL) 10 MG tablet Take 1 tablet (10 mg total) by mouth 2 (two) times daily before a meal. 60 tablet 5  . lisinopril-hydrochlorothiazide (PRINZIDE,ZESTORETIC) 10-12.5 MG tablet Take 1 tablet by mouth daily. 30 tablet 5  . metFORMIN (GLUCOPHAGE-XR) 500 MG 24 hr tablet Take 2 tablets (1,000 mg total) by mouth 2 (two) times daily. 2 tabs daily 120 tablet 5  . sitaGLIPtin (JANUVIA) 100 MG tablet Take 1 tablet (100 mg total) by mouth daily. 30 tablet 5  . lidocaine (XYLOCAINE) 2 % solution Use as directed 10 mLs in the mouth or throat every 6 (six) hours as needed for mouth pain. (Patient not taking: Reported on 04/01/2017) 100 mL 0  . polyethylene glycol powder (GLYCOLAX/MIRALAX) powder Take 17 g by mouth 2 (two) times daily as needed. (Patient not taking: Reported on 04/01/2017) 3350 g 1  . mupirocin ointment (BACTROBAN) 2 % Place 1 application into the nose 2 (two) times daily. (Patient not taking: Reported on 04/01/2017) 22 g 1  . naproxen sodium (ANAPROX) 550 MG tablet TAKE 1 TABLET BY MOUTH 2 TIMES DAILY WITH A MEAL FOR PAIN (Patient not taking: Reported on 12/30/2016) 30 tablet 2     Facility-Administered Medications Prior to Visit  Medication Dose Route Frequency Provider Last Rate Last Dose  . 0.9 %  sodium chloride infusion  500 mL Intravenous Continuous Armbruster, Steven P, MD        ROS Review of Systems  Constitutional: Negative for activity change, appetite change and fatigue.  HENT: Negative for congestion, sinus pressure and sore throat.   Eyes: Negative for visual disturbance.  Respiratory: Negative for cough, chest tightness, shortness of breath and wheezing.   Cardiovascular: Negative for  chest pain and palpitations.  Gastrointestinal: Negative for abdominal distention, abdominal pain and constipation.  Endocrine: Negative for polydipsia.  Genitourinary: Negative for dysuria and frequency.  Musculoskeletal: Negative for arthralgias and back pain.  Skin: Negative for rash.  Neurological: Negative for tremors, light-headedness and numbness.  Hematological: Does not bruise/bleed easily.  Psychiatric/Behavioral: Negative for agitation and behavioral problems.    Objective:  BP (!) 144/77   Pulse 86   Temp 98 F (36.7 C) (Oral)   Ht 5' 5" (1.651 m)   Wt 213 lb 12.8 oz (97 kg)   SpO2 99%   BMI 35.58 kg/m   BP/Weight 04/01/2017 12/30/2016 11/27/2016  Systolic BP 144 121 121  Diastolic BP 77 79 81  Wt. (Lbs) 213.8 212.4 213.4  BMI 35.58 35.35 35.51      Physical Exam  Constitutional: She is oriented to person, place, and time. She appears well-developed and well-nourished.  Cardiovascular: Normal rate, normal heart sounds and intact distal pulses.  No murmur heard. Pulmonary/Chest: Effort normal and breath sounds normal. She has no wheezes. She has no rales. She exhibits no tenderness.  Abdominal: Soft. Bowel sounds are normal. She exhibits no distension and no mass. There is no tenderness.  Musculoskeletal: Normal range of motion.  Neurological: She is alert and oriented to person, place, and time.  Skin: Skin is warm and dry.  Psychiatric: She has a normal mood and affect.     CMP Latest Ref Rng & Units 11/27/2016 01/19/2016 03/22/2015  Glucose 65 - 99 mg/dL 301(H) 113(H) 213(H)  BUN 6 - 24 mg/dL 12 14 11  Creatinine 0.57 - 1.00 mg/dL 0.72 0.88 0.71  Sodium 134 - 144 mmol/L 135 140 137  Potassium 3.5 - 5.2 mmol/L 4.6 4.4 4.3  Chloride 96 - 106 mmol/L 96 107 101  CO2 20 - 29 mmol/L 25 25 23  Calcium 8.7 - 10.2 mg/dL 9.3 9.4 9.5  Total Protein 6.0 - 8.5 g/dL 7.7 7.0 -  Total Bilirubin 0.0 - 1.2 mg/dL 0.3 0.5 -  Alkaline Phos 39 - 117 IU/L 146(H) 88 -  AST 0 -  40 IU/L 34 18 -  ALT 0 - 32 IU/L 34(H) 13 -    Lipid Panel     Component Value Date/Time   CHOL 163 01/19/2016 1043   TRIG 157 (H) 01/19/2016 1043   HDL 51 01/19/2016 1043   CHOLHDL 3.2 01/19/2016 1043   VLDL 31 (H) 01/19/2016 1043   LDLCALC 81 01/19/2016 1043    Lab Results  Component Value Date   HGBA1C 6.9 04/01/2017      Assessment & Plan:   1. Diabetes mellitus due to underlying condition with hyperosmolarity without coma, without long-term current use of insulin (HCC) Controlled with A1c of 6.9 which has improved significantly from 10.2 previously Continue current regimen, diabetic diet, lifestyle modifications - POCT glucose (manual entry) - POCT glycosylated hemoglobin (Hb A1C) - metFORMIN (GLUCOPHAGE-XR) 500 MG 24 hr tablet; Take 2 tablets (1,000 mg   total) by mouth 2 (two) times daily. 2 tabs daily  Dispense: 120 tablet; Refill: 5 - Lipid panel; Future - TSH; Future - glipiZIDE (GLUCOTROL) 10 MG tablet; Take 1 tablet (10 mg total) by mouth 2 (two) times daily before a meal.  Dispense: 60 tablet; Refill: 5  2. Other diabetic neurological complication associated with type 2 diabetes mellitus (HCC) Uncontrolled Increase dose of gabapentin - gabapentin (NEURONTIN) 300 MG capsule; Take 1 capsule (300 mg total) by mouth 2 (two) times daily.  Dispense: 60 capsule; Refill: 5  3. Essential hypertension Blood pressure is slightly above target of less than 130/80 Low-sodium, DASH diet, lifestyle modifications We will reassess at next visit - lisinopril-hydrochlorothiazide (PRINZIDE,ZESTORETIC) 10-12.5 MG tablet; Take 1 tablet by mouth daily.  Dispense: 30 tablet; Refill: 5  4. Pure hypercholesterolemia Controlled - atorvastatin (LIPITOR) 20 MG tablet; Take 1 tablet (20 mg total) by mouth daily.  Dispense: 30 tablet; Refill: 5  5. Screening for viral disease - HIV antibody (with reflex); Future - Hepatitis c antibody (reflex); Future   Meds ordered this encounter    Medications  . gabapentin (NEURONTIN) 300 MG capsule    Sig: Take 1 capsule (300 mg total) by mouth 2 (two) times daily.    Dispense:  60 capsule    Refill:  5    Discontinue previous dose  . sitaGLIPtin (JANUVIA) 100 MG tablet    Sig: Take 1 tablet (100 mg total) by mouth daily.    Dispense:  30 tablet    Refill:  5  . naproxen sodium (ANAPROX) 550 MG tablet    Sig: TAKE 1 TABLET BY MOUTH 2 TIMES DAILY WITH A MEAL FOR PAIN    Dispense:  30 tablet    Refill:  2  . metFORMIN (GLUCOPHAGE-XR) 500 MG 24 hr tablet    Sig: Take 2 tablets (1,000 mg total) by mouth 2 (two) times daily. 2 tabs daily    Dispense:  120 tablet    Refill:  5  . lisinopril-hydrochlorothiazide (PRINZIDE,ZESTORETIC) 10-12.5 MG tablet    Sig: Take 1 tablet by mouth daily.    Dispense:  30 tablet    Refill:  5  . glipiZIDE (GLUCOTROL) 10 MG tablet    Sig: Take 1 tablet (10 mg total) by mouth 2 (two) times daily before a meal.    Dispense:  60 tablet    Refill:  5  . atorvastatin (LIPITOR) 20 MG tablet    Sig: Take 1 tablet (20 mg total) by mouth daily.    Dispense:  30 tablet    Refill:  5    Follow-up: Return in about 3 weeks (around 04/22/2017) for PAP smear.   Arnoldo Morale MD

## 2017-04-01 NOTE — Progress Notes (Signed)
Pt is having nerve pain and would like to increase the gabapentin.

## 2017-04-04 ENCOUNTER — Ambulatory Visit: Payer: No Typology Code available for payment source | Attending: Family Medicine

## 2017-04-04 DIAGNOSIS — E0800 Diabetes mellitus due to underlying condition with hyperosmolarity without nonketotic hyperglycemic-hyperosmolar coma (NKHHC): Secondary | ICD-10-CM

## 2017-04-04 DIAGNOSIS — Z1159 Encounter for screening for other viral diseases: Secondary | ICD-10-CM

## 2017-04-04 NOTE — Progress Notes (Signed)
Patient here for lab visit only 

## 2017-04-05 LAB — HEPATITIS C ANTIBODY (REFLEX)

## 2017-04-05 LAB — LIPID PANEL
CHOL/HDL RATIO: 2.7 ratio (ref 0.0–4.4)
Cholesterol, Total: 143 mg/dL (ref 100–199)
HDL: 53 mg/dL (ref >39)
LDL Calculated: 63 mg/dL (ref 0–99)
Triglycerides: 133 mg/dL (ref 0–149)
VLDL Cholesterol Cal: 27 mg/dL (ref 5–40)

## 2017-04-05 LAB — HIV ANTIBODY (ROUTINE TESTING W REFLEX): HIV SCREEN 4TH GENERATION: NONREACTIVE

## 2017-04-05 LAB — TSH: TSH: 2.02 u[IU]/mL (ref 0.450–4.500)

## 2017-04-05 LAB — HCV COMMENT:

## 2017-04-15 ENCOUNTER — Telehealth: Payer: Self-pay

## 2017-04-15 NOTE — Telephone Encounter (Signed)
Pt was called via interpretor and was informed of lab results.

## 2017-04-16 ENCOUNTER — Ambulatory Visit: Payer: No Typology Code available for payment source

## 2017-04-22 ENCOUNTER — Ambulatory Visit: Payer: No Typology Code available for payment source | Admitting: Family Medicine

## 2017-04-25 ENCOUNTER — Encounter: Payer: Self-pay | Admitting: Family Medicine

## 2017-04-25 ENCOUNTER — Ambulatory Visit: Payer: Self-pay | Attending: Family Medicine | Admitting: Family Medicine

## 2017-04-25 VITALS — BP 128/82 | HR 100 | Temp 97.6°F | Ht 65.0 in | Wt 211.0 lb

## 2017-04-25 DIAGNOSIS — Z9889 Other specified postprocedural states: Secondary | ICD-10-CM | POA: Insufficient documentation

## 2017-04-25 DIAGNOSIS — E1140 Type 2 diabetes mellitus with diabetic neuropathy, unspecified: Secondary | ICD-10-CM | POA: Insufficient documentation

## 2017-04-25 DIAGNOSIS — E785 Hyperlipidemia, unspecified: Secondary | ICD-10-CM | POA: Insufficient documentation

## 2017-04-25 DIAGNOSIS — Z124 Encounter for screening for malignant neoplasm of cervix: Secondary | ICD-10-CM | POA: Insufficient documentation

## 2017-04-25 DIAGNOSIS — I10 Essential (primary) hypertension: Secondary | ICD-10-CM | POA: Insufficient documentation

## 2017-04-25 DIAGNOSIS — Z7984 Long term (current) use of oral hypoglycemic drugs: Secondary | ICD-10-CM | POA: Insufficient documentation

## 2017-04-25 DIAGNOSIS — Z880 Allergy status to penicillin: Secondary | ICD-10-CM | POA: Insufficient documentation

## 2017-04-25 DIAGNOSIS — Z79899 Other long term (current) drug therapy: Secondary | ICD-10-CM | POA: Insufficient documentation

## 2017-04-25 NOTE — Progress Notes (Signed)
Subjective:  Patient ID: Carmen Burns, female    DOB: 02-Jan-1961  Age: 57 y.o. MRN: [id number]  CC: Gynecologic Exam   HPI Pattijo Juste is a 57 year old female with a history of type 2 diabetes mellitus (A1c 6.9), diabetic neuropathy, hypertension, hyperlipidemia who presents today for a Pap smear. She denies vaginal itching, vaginal discharge, pelvic pain. She had her mammogram in 12/2016-negative for malignancy Denies acute concerns today.  Past Medical History:  Diagnosis Date  . Anemia 2013  . Diabetes mellitus without complication ([place])   . Hyperlipidemia   . Hypertension   . Refusal of blood transfusions as patient is Jehovah's Witness     Past Surgical History:  Procedure Laterality Date  . APPENDECTOMY    . DILITATION & CURRETTAGE/HYSTROSCOPY WITH NOVASURE ABLATION N/A 01/12/2013   Procedure: DILATATION & CURETTAGE/HYSTEROSCOPY WITH NOVASURE ABLATION;  Surgeon: Osborne Oman, MD;  Location: [facility];  Service: Gynecology;  Laterality: N/A;    Allergies  Allergen Reactions  . Penicillins Itching and Rash     Past Medical History:  Diagnosis Date  . Anemia 2013  . Diabetes mellitus without complication ([place])   . Hyperlipidemia   . Hypertension   . Refusal of blood transfusions as patient is Jehovah's Witness     Past Surgical History:  Procedure Laterality Date  . APPENDECTOMY    . DILITATION & CURRETTAGE/HYSTROSCOPY WITH NOVASURE ABLATION N/A 01/12/2013   Procedure: DILATATION & CURETTAGE/HYSTEROSCOPY WITH NOVASURE ABLATION;  Surgeon: Osborne Oman, MD;  Location: [facility];  Service: Gynecology;  Laterality: N/A;    Allergies  Allergen Reactions  . Penicillins Itching and Rash     Outpatient Medications Prior to Visit  Medication Sig Dispense Refill  . atorvastatin (LIPITOR) 20 MG tablet Take 1 tablet (20 mg total) by mouth daily. 30 tablet 5  . Blood Glucose Monitoring Suppl (TRUE METRIX METER) W/DEVICE KIT USE TO CHECK BLOOD SUGAR THREE  TIMES DAILY AND AT BEDTIME 1 kit 0  . cetirizine (ZYRTEC) 10 MG tablet Take 1 tablet (10 mg total) by mouth daily. 30 tablet 3  . gabapentin (NEURONTIN) 300 MG capsule Take 1 capsule (300 mg total) by mouth 2 (two) times daily. 60 capsule 5  . glipiZIDE (GLUCOTROL) 10 MG tablet Take 1 tablet (10 mg total) by mouth 2 (two) times daily before a meal. 60 tablet 5  . glucose blood test strip USE TO CHECK BLOOD SUGAR THREE TIMES DAILY AND AT BEDTIME 100 each 12  . lisinopril-hydrochlorothiazide (PRINZIDE,ZESTORETIC) 10-12.5 MG tablet Take 1 tablet by mouth daily. 30 tablet 5  . metFORMIN (GLUCOPHAGE-XR) 500 MG 24 hr tablet Take 2 tablets (1,000 mg total) by mouth 2 (two) times daily. 2 tabs daily 120 tablet 5  . naproxen sodium (ANAPROX) 550 MG tablet TAKE 1 TABLET BY MOUTH 2 TIMES DAILY WITH A MEAL FOR PAIN 30 tablet 2  . sitaGLIPtin (JANUVIA) 100 MG tablet Take 1 tablet (100 mg total) by mouth daily. 30 tablet 5  . TRUEPLUS LANCETS 28G MISC USE TO CHECK BLOOD SUGAR THREE TIMES DAILY AND AT BEDTIME 100 each 2  . lidocaine (XYLOCAINE) 2 % solution Use as directed 10 mLs in the mouth or throat every 6 (six) hours as needed for mouth pain. (Patient not taking: Reported on 04/01/2017) 100 mL 0  . polyethylene glycol powder (GLYCOLAX/MIRALAX) powder Take 17 g by mouth 2 (two) times daily as needed. (Patient not taking: Reported on 04/01/2017) 3350 g 1   Facility-Administered Medications Prior to Visit  Medication Dose Route Frequency Provider Last Rate Last Dose  . 0.9 %  sodium chloride infusion  500 mL Intravenous Continuous Armbruster, Carlota Raspberry, MD        ROS Review of Systems  Constitutional: Negative for activity change and appetite change.  HENT: Negative for sinus pressure and sore throat.   Respiratory: Negative for chest tightness, shortness of breath and wheezing.   Cardiovascular: Negative for chest pain and palpitations.  Gastrointestinal: Negative for abdominal distention, abdominal pain and  constipation.  Genitourinary: Negative.   Musculoskeletal: Negative.   Psychiatric/Behavioral: Negative for behavioral problems and dysphoric mood.    Objective:  BP 128/82   Pulse 100   Temp 97.6 F (36.4 C) (Oral)   Ht _0  (1.651 m)   Wt 211 lb (95.7 kg)   SpO2 99%   BMI 35.11 kg/m   BP/Weight 04/25/2017 04/01/2017 75/09/9726  Systolic BP 206 015 615  Diastolic BP 82 77 79  Wt. (Lbs) 211 213.8 212.4  BMI 35.11 35.58 35.35      Physical Exam  Constitutional: She is oriented to person, place, and time. She appears well-developed and well-nourished.  Cardiovascular: Normal rate, normal heart sounds and intact distal pulses.  No murmur heard. Pulmonary/Chest: Effort normal and breath sounds normal. She has no wheezes. She has no rales. She exhibits no tenderness.  Abdominal: Soft. Bowel sounds are normal. She exhibits no distension and no mass. There is no tenderness.  Genitourinary:  Genitourinary Comments: External genitalia, vagina, cervix, adnexa - normal  Musculoskeletal: Normal range of motion.  Neurological: She is alert and oriented to person, place, and time.     Assessment & Plan:   1. Screening for cervical cancer No indication for STD testing as she is not high risk Mammogram was done in 12/2016 - Cytology - PAP([place])   No orders of the defined types were placed in this encounter.   Follow-up: Return in about 3 months (around 07/23/2017) for follow up of diabetes mellitus.   Charlott Rakes MD

## 2017-04-25 NOTE — Patient Instructions (Signed)

## 2017-04-30 LAB — CYTOLOGY - PAP
Diagnosis: NEGATIVE
Diagnosis: REACTIVE
HPV (WINDOPATH): NOT DETECTED

## 2017-05-01 ENCOUNTER — Telehealth: Payer: Self-pay | Admitting: *Deleted

## 2017-05-01 NOTE — Telephone Encounter (Signed)
May inform patient of results when she returns call:  Notes recorded by Carilyn Goodpasture, RN on 05/01/2017 at 4:56 PM EST Left message on voicemail for patient to return call. Gwyndolyn Saxon Spanish interpreter: [phone] assisted with the call. ------  Notes recorded by Charlott Rakes, MD on 04/30/2017 at 2:21 PM EST Pap smear is negative for malignancy

## 2017-06-27 ENCOUNTER — Ambulatory Visit: Payer: Self-pay | Attending: Family Medicine | Admitting: Family Medicine

## 2017-06-27 ENCOUNTER — Encounter: Payer: Self-pay | Admitting: Family Medicine

## 2017-06-27 VITALS — BP 136/84 | HR 97 | Temp 97.8°F | Ht 65.0 in | Wt 215.6 lb

## 2017-06-27 DIAGNOSIS — E0800 Diabetes mellitus due to underlying condition with hyperosmolarity without nonketotic hyperglycemic-hyperosmolar coma (NKHHC): Secondary | ICD-10-CM

## 2017-06-27 DIAGNOSIS — D120 Benign neoplasm of cecum: Secondary | ICD-10-CM | POA: Insufficient documentation

## 2017-06-27 DIAGNOSIS — Z7984 Long term (current) use of oral hypoglycemic drugs: Secondary | ICD-10-CM | POA: Insufficient documentation

## 2017-06-27 DIAGNOSIS — K219 Gastro-esophageal reflux disease without esophagitis: Secondary | ICD-10-CM | POA: Insufficient documentation

## 2017-06-27 DIAGNOSIS — K635 Polyp of colon: Secondary | ICD-10-CM

## 2017-06-27 DIAGNOSIS — I10 Essential (primary) hypertension: Secondary | ICD-10-CM | POA: Insufficient documentation

## 2017-06-27 DIAGNOSIS — E1140 Type 2 diabetes mellitus with diabetic neuropathy, unspecified: Secondary | ICD-10-CM | POA: Insufficient documentation

## 2017-06-27 DIAGNOSIS — Z79899 Other long term (current) drug therapy: Secondary | ICD-10-CM | POA: Insufficient documentation

## 2017-06-27 DIAGNOSIS — E7800 Pure hypercholesterolemia, unspecified: Secondary | ICD-10-CM | POA: Insufficient documentation

## 2017-06-27 DIAGNOSIS — E785 Hyperlipidemia, unspecified: Secondary | ICD-10-CM | POA: Insufficient documentation

## 2017-06-27 DIAGNOSIS — E1100 Type 2 diabetes mellitus with hyperosmolarity without nonketotic hyperglycemic-hyperosmolar coma (NKHHC): Secondary | ICD-10-CM | POA: Insufficient documentation

## 2017-06-27 DIAGNOSIS — E1149 Type 2 diabetes mellitus with other diabetic neurological complication: Secondary | ICD-10-CM

## 2017-06-27 LAB — GLUCOSE, POCT (MANUAL RESULT ENTRY): POC Glucose: 98 mg/dl (ref 70–99)

## 2017-06-27 LAB — POCT GLYCOSYLATED HEMOGLOBIN (HGB A1C): Hemoglobin A1C: 6.8

## 2017-06-27 MED ORDER — GLIPIZIDE 10 MG PO TABS: 10.0000 mg | ORAL_TABLET | Freq: Two times a day (BID) | ORAL | 5 refills | Status: DC

## 2017-06-27 MED ORDER — METFORMIN HCL ER 500 MG PO TB24: 1000.0000 mg | ORAL_TABLET | Freq: Two times a day (BID) | ORAL | 5 refills | Status: DC

## 2017-06-27 MED ORDER — PANTOPRAZOLE SODIUM 40 MG PO TBEC: 40.0000 mg | DELAYED_RELEASE_TABLET | Freq: Every day | ORAL | 5 refills | Status: DC

## 2017-06-27 MED ORDER — GABAPENTIN 300 MG PO CAPS: 600.0000 mg | ORAL_CAPSULE | Freq: Two times a day (BID) | ORAL | 5 refills | Status: DC

## 2017-06-27 MED ORDER — ATORVASTATIN CALCIUM 20 MG PO TABS: 20.0000 mg | ORAL_TABLET | Freq: Every day | ORAL | 5 refills | Status: DC

## 2017-06-27 MED ORDER — LISINOPRIL-HYDROCHLOROTHIAZIDE 10-12.5 MG PO TABS: 1.0000 | ORAL_TABLET | Freq: Every day | ORAL | 5 refills | Status: DC

## 2017-06-27 NOTE — Patient Instructions (Signed)
Opciones de alimentos para pacientes con reflujo gastroesofágico - Adultos  (Food Choices for Gastroesophageal Reflux Disease, Adult)  Cuando se tiene reflujo gastroesofágico (ERGE), los alimentos que se ingieren y los hábitos de alimentación son muy importantes. Elegir los alimentos adecuados puede ayudar a aliviar las molestias.  ¿QUÉ PAUTAS DEBO SEGUIR?  · Elija las frutas, los vegetales, los cereales integrales y los productos lácteos con bajo contenido de grasa.  · Elija las carnes de vaca, de pescado y de ave con bajo contenido de grasas.  · Limite las grasas, como los aceites, los aderezos para ensalada, la manteca, los frutos secos y el aguacate.  · Lleve un registro de alimentos. Esto ayuda a identificar los alimentos que ocasionan síntomas.  · Evite los alimentos que le ocasionen síntomas. Pueden ser distintos para cada persona.  · Haga comidas pequeñas durante el día en lugar de 3 comidas abundantes.  · Coma lentamente, en un lugar donde esté distendido.  · Limite el consumo de alimentos fritos.  · Cocine los alimentos utilizando métodos que no sean la fritura.  · Evite el consumo alcohol.  · Evite beber grandes cantidades de líquidos con las comidas.  · Evite agacharse o recostarse hasta después de 2 o 3 horas de haber comido.    ¿QUÉ ALIMENTOS NO SE RECOMIENDAN?  Estos son algunos alimentos y bebidas que pueden empeorar los síntomas:  Vegetales  Tomates. Jugo de tomate. Salsa de tomate y espagueti. Ajíes. Cebolla y ajo. Rábano picante.  Frutas  Naranjas, pomelos y limón (fruta y jugo).  Carnes  Carnes de vaca, de pescado y de ave con gran contenido de grasas. Esto incluye los perros calientes, las costillas, el jamón, la salchicha, el salame y el tocino.  Lácteos  Leche entera y leche chocolatada. Crema ácida. Crema. Mantequilla. Helados. Queso crema.  Bebidas  Té o café. Bebidas gaseosas o bebidas energizantes.  Condimentos  Salsa picante. Salsa barbacoa.  Dulces/postres   Chocolate y cacao. Rosquillas. Menta y mentol.  Grasas y aceites  Alimentos muy grasos. Esto incluye las papas fritas.  Otros  Vinagre. Especias picantes. Esto incluye la pimienta negra, la pimienta blanca, la pimienta roja, la pimienta de cayena, el curry en polvo, los clavos de olor, el jengibre y el chile en polvo.  Esta no es una lista completa de los alimentos y las bebidas que se deben evitar. Comuníquese con el nutricionista para recibir más información.  Esta información no tiene como fin reemplazar el consejo del médico. Asegúrese de hacerle al médico cualquier pregunta que tenga.  Document Released: 09/10/2011 Document Revised: 04/01/2014 Document Reviewed: 01/13/2013  Elsevier Interactive Patient Education © 2017 Elsevier Inc.

## 2017-06-27 NOTE — Progress Notes (Signed)
Subjective:  Patient ID: Carmen Burns, female    DOB: July 18, 1960  Age: 57 y.o. MRN: [id number]  CC: Diabetes   HPI Carmen Burns is a 57 year old female with a history of type 2 diabetes mellitus (A1c 6.8), hypertension, hyperlipidemia who presents today for follow-up visit. She endorses compliance with metformin, Januvia and glipizide and denies hypoglycemic episodes or visual concerns.  She is yet to undergo an eye exam due to lack of medical coverage. Doing well on her antihypertensive and her statin and denies adverse effects.  She complains of epigastric burning which has been present for the last few weeks and associated dyspepsia and reflux.  Also complains of numbness and burning in her feet despite taking gabapentin. She informs me she is due for colonoscopy as she was previously informed she had a polyp at her last colonoscopy and needed a repeat. Review of her chart indicates she had a colonoscopy in 01/31/2016 at which time findings revealed a polyp at the previous appendectomy site.  Past Medical History:  Diagnosis Date  . Anemia 2013  . Diabetes mellitus without complication ([place])   . Hyperlipidemia   . Hypertension   . Refusal of blood transfusions as patient is Jehov[employer]'s Witness     Past Surgical History:  Procedure Laterality Date  . APPENDECTOMY    . DILITATION & CURRETTAGE/HYSTROSCOPY WITH NOVASURE ABLATION N/A 01/12/2013   Procedure: DILATATION & CURETTAGE/HYSTEROSCOPY WITH NOVASURE ABLATION;  Surgeon: Osborne Oman, MD;  Location: [facility];  Service: Gynecology;  Laterality: N/A;    Family History  Problem Relation Age of Onset  . Diabetes Brother   . Hypertension Brother   . Colon cancer Neg Hx   . Breast cancer Neg Hx     Allergies  Allergen Reactions  . Penicillins Itching and Rash     Outpatient Medications Prior to Visit  Medication Sig Dispense Refill  . Blood Glucose Monitoring Suppl (TRUE METRIX METER) W/DEVICE KIT USE TO CHECK  BLOOD SUGAR THREE TIMES DAILY AND AT BEDTIME 1 kit 0  . cetirizine (ZYRTEC) 10 MG tablet Take 1 tablet (10 mg total) by mouth daily. 30 tablet 3  . glucose blood test strip USE TO CHECK BLOOD SUGAR THREE TIMES DAILY AND AT BEDTIME 100 each 12  . naproxen sodium (ANAPROX) 550 MG tablet TAKE 1 TABLET BY MOUTH 2 TIMES DAILY WITH A MEAL FOR PAIN 30 tablet 2  . sitaGLIPtin (JANUVIA) 100 MG tablet Take 1 tablet (100 mg total) by mouth daily. 30 tablet 5  . TRUEPLUS LANCETS 28G MISC USE TO CHECK BLOOD SUGAR THREE TIMES DAILY AND AT BEDTIME 100 each 2  . atorvastatin (LIPITOR) 20 MG tablet Take 1 tablet (20 mg total) by mouth daily. 30 tablet 5  . gabapentin (NEURONTIN) 300 MG capsule Take 1 capsule (300 mg total) by mouth 2 (two) times daily. 60 capsule 5  . glipiZIDE (GLUCOTROL) 10 MG tablet Take 1 tablet (10 mg total) by mouth 2 (two) times daily before a meal. 60 tablet 5  . lisinopril-hydrochlorothiazide (PRINZIDE,ZESTORETIC) 10-12.5 MG tablet Take 1 tablet by mouth daily. 30 tablet 5  . metFORMIN (GLUCOPHAGE-XR) 500 MG 24 hr tablet Take 2 tablets (1,000 mg total) by mouth 2 (two) times daily. 2 tabs daily 120 tablet 5  . lidocaine (XYLOCAINE) 2 % solution Use as directed 10 mLs in the mouth or throat every 6 (six) hours as needed for mouth pain. (Patient not taking: Reported on 04/01/2017) 100 mL 0  . polyethylene glycol powder (  GLYCOLAX/MIRALAX) powder Take 17 g by mouth 2 (two) times daily as needed. (Patient not taking: Reported on 04/01/2017) 3350 g 1   Facility-Administered Medications Prior to Visit  Medication Dose Route Frequency Provider Last Rate Last Dose  . 0.9 %  sodium chloride infusion  500 mL Intravenous Continuous Armbruster, Carlota Raspberry, MD        ROS Review of Systems  Constitutional: Negative for activity change, appetite change and fatigue.  HENT: Negative for congestion, sinus pressure and sore throat.   Eyes: Negative for visual disturbance.  Respiratory: Negative for cough,  chest tightness, shortness of breath and wheezing.   Cardiovascular: Negative for chest pain and palpitations.  Gastrointestinal: Negative for abdominal distention, abdominal pain and constipation.  Endocrine: Negative for polydipsia.  Genitourinary: Negative for dysuria and frequency.  Musculoskeletal: Negative for arthralgias and back pain.  Skin: Negative for rash.  Neurological: Positive for numbness. Negative for tremors and light-headedness.  Hematological: Does not bruise/bleed easily.  Psychiatric/Behavioral: Negative for agitation and behavioral problems.    Objective:  BP 136/84   Pulse 97   Temp 97.8 F (36.6 C) (Oral)   Ht 5' 5"  (1.651 m)   Wt 215 lb 9.6 oz (97.8 kg)   SpO2 97%   BMI 35.88 kg/m   BP/Weight 06/27/2017 4/0/9811 11/23/4780  Systolic BP 956 213 086  Diastolic BP 84 82 77  Wt. (Lbs) 215.6 211 213.8  BMI 35.88 35.11 35.58      Physical Exam  Constitutional: She is oriented to person, place, and time. She appears well-developed and well-nourished.  Cardiovascular: Normal rate, normal heart sounds and intact distal pulses.  No murmur heard. Pulmonary/Chest: Effort normal and breath sounds normal. She has no wheezes. She has no rales. She exhibits no tenderness.  Abdominal: Soft. Bowel sounds are normal. She exhibits no distension and no mass. There is no tenderness.  Musculoskeletal: Normal range of motion.  Neurological: She is alert and oriented to person, place, and time.  Skin: Skin is warm and dry.  Psychiatric: She has a normal mood and affect.     Lab Results  Component Value Date   HGBA1C 6.8 06/27/2017    Assessment & Plan:   1. Diabetes mellitus due to underlying condition with hyperosmolarity without coma, without long-term current use of insulin (HCC) Controlled with A1c of 6.8  Continue diabetic diet, lifestyle modifications - POCT glucose (manual entry) - POCT glycosylated hemoglobin (Hb A1C) - glipiZIDE (GLUCOTROL) 10 MG tablet;  Take 1 tablet (10 mg total) by mouth 2 (two) times daily before a meal.  Dispense: 60 tablet; Refill: 5 - metFORMIN (GLUCOPHAGE-XR) 500 MG 24 hr tablet; Take 2 tablets (1,000 mg total) by mouth 2 (two) times daily. 2 tabs daily  Dispense: 120 tablet; Refill: 5  2. Pure hypercholesterolemia Controlled Low-cholesterol diet - atorvastatin (LIPITOR) 20 MG tablet; Take 1 tablet (20 mg total) by mouth daily.  Dispense: 30 tablet; Refill: 5  3. Other diabetic neurological complication associated with type 2 diabetes mellitus (HCC) Uncontrolled Increased dose of gabapentin - gabapentin (NEURONTIN) 300 MG capsule; Take 2 capsules (600 mg total) by mouth 2 (two) times daily.  Dispense: 120 capsule; Refill: 5  4. Essential hypertension Controlled Counseled on blood pressure goal of less than 130/80, low-sodium, DASH diet, medication compliance, 150 minutes of moderate intensity exercise per week. Discussed medication compliance, adverse effects. - lisinopril-hydrochlorothiazide (PRINZIDE,ZESTORETIC) 10-12.5 MG tablet; Take 1 tablet by mouth daily.  Dispense: 30 tablet; Refill: 5  5. Gastroesophageal reflux  disease without esophagitis Newly diagnosed Advised to avoid late meals, avoid recumbency up to 2 hours after meals - pantoprazole (PROTONIX) 40 MG tablet; Take 1 tablet (40 mg total) by mouth daily.  Dispense: 30 tablet; Refill: 5  6. Polyp of cecum Colonoscopy from 2017 revealed polyp at previous appendectomy site and repeat in 3-6 months recommended by Dr. Havery Moros - Ambulatory referral to Gastroenterology   Meds ordered this encounter  Medications  . atorvastatin (LIPITOR) 20 MG tablet    Sig: Take 1 tablet (20 mg total) by mouth daily.    Dispense:  30 tablet    Refill:  5  . gabapentin (NEURONTIN) 300 MG capsule    Sig: Take 2 capsules (600 mg total) by mouth 2 (two) times daily.    Dispense:  120 capsule    Refill:  5    Discontinue previous dose  . glipiZIDE (GLUCOTROL) 10 MG  tablet    Sig: Take 1 tablet (10 mg total) by mouth 2 (two) times daily before a meal.    Dispense:  60 tablet    Refill:  5  . lisinopril-hydrochlorothiazide (PRINZIDE,ZESTORETIC) 10-12.5 MG tablet    Sig: Take 1 tablet by mouth daily.    Dispense:  30 tablet    Refill:  5  . metFORMIN (GLUCOPHAGE-XR) 500 MG 24 hr tablet    Sig: Take 2 tablets (1,000 mg total) by mouth 2 (two) times daily. 2 tabs daily    Dispense:  120 tablet    Refill:  5  . pantoprazole (PROTONIX) 40 MG tablet    Sig: Take 1 tablet (40 mg total) by mouth daily.    Dispense:  30 tablet    Refill:  5    Follow-up: Return in about 3 months (around 09/26/2017) for follow up of chronic medical cnditions.   Charlott Rakes MD

## 2017-08-12 ENCOUNTER — Ambulatory Visit: Payer: Self-pay | Admitting: Gastroenterology

## 2017-09-26 ENCOUNTER — Ambulatory Visit: Payer: Self-pay | Admitting: Family Medicine

## 2017-09-29 ENCOUNTER — Encounter: Payer: Self-pay | Admitting: Family Medicine

## 2017-09-29 ENCOUNTER — Ambulatory Visit: Payer: Self-pay | Attending: Family Medicine | Admitting: Family Medicine

## 2017-09-29 VITALS — BP 137/80 | HR 113 | Temp 98.0°F | Ht 65.0 in | Wt 220.4 lb

## 2017-09-29 DIAGNOSIS — Z79899 Other long term (current) drug therapy: Secondary | ICD-10-CM | POA: Insufficient documentation

## 2017-09-29 DIAGNOSIS — Z880 Allergy status to penicillin: Secondary | ICD-10-CM | POA: Insufficient documentation

## 2017-09-29 DIAGNOSIS — K219 Gastro-esophageal reflux disease without esophagitis: Secondary | ICD-10-CM

## 2017-09-29 DIAGNOSIS — Z7984 Long term (current) use of oral hypoglycemic drugs: Secondary | ICD-10-CM | POA: Insufficient documentation

## 2017-09-29 DIAGNOSIS — E1149 Type 2 diabetes mellitus with other diabetic neurological complication: Secondary | ICD-10-CM

## 2017-09-29 DIAGNOSIS — E7800 Pure hypercholesterolemia, unspecified: Secondary | ICD-10-CM

## 2017-09-29 DIAGNOSIS — I10 Essential (primary) hypertension: Secondary | ICD-10-CM

## 2017-09-29 DIAGNOSIS — E1100 Type 2 diabetes mellitus with hyperosmolarity without nonketotic hyperglycemic-hyperosmolar coma (NKHHC): Secondary | ICD-10-CM | POA: Insufficient documentation

## 2017-09-29 DIAGNOSIS — E1140 Type 2 diabetes mellitus with diabetic neuropathy, unspecified: Secondary | ICD-10-CM | POA: Insufficient documentation

## 2017-09-29 DIAGNOSIS — E0800 Diabetes mellitus due to underlying condition with hyperosmolarity without nonketotic hyperglycemic-hyperosmolar coma (NKHHC): Secondary | ICD-10-CM

## 2017-09-29 LAB — GLUCOSE, POCT (MANUAL RESULT ENTRY): POC GLUCOSE: 161 mg/dL — AB (ref 70–99)

## 2017-09-29 NOTE — Patient Instructions (Signed)
Diabetes Mellitus and Nutrition When you have diabetes (diabetes mellitus), it is very important to have healthy eating habits because your blood sugar (glucose) levels are greatly affected by what you eat and drink. Eating healthy foods in the appropriate amounts, at about the same times every day, can help you:  Control your blood glucose.  Lower your risk of heart disease.  Improve your blood pressure.  Reach or maintain a healthy weight.  Every person with diabetes is different, and each person has different needs for a meal plan. Your health care provider may recommend that you work with a diet and nutrition specialist (dietitian) to make a meal plan that is best for you. Your meal plan may vary depending on factors such as:  The calories you need.  The medicines you take.  Your weight.  Your blood glucose, blood pressure, and cholesterol levels.  Your activity level.  Other health conditions you have, such as heart or kidney disease.  How do carbohydrates affect me? Carbohydrates affect your blood glucose level more than any other type of food. Eating carbohydrates naturally increases the amount of glucose in your blood. Carbohydrate counting is a method for keeping track of how many carbohydrates you eat. Counting carbohydrates is important to keep your blood glucose at a healthy level, especially if you use insulin or take certain oral diabetes medicines. It is important to know how many carbohydrates you can safely have in each meal. This is different for every person. Your dietitian can help you calculate how many carbohydrates you should have at each meal and for snack. Foods that contain carbohydrates include:  Bread, cereal, rice, pasta, and crackers.  Potatoes and corn.  Peas, beans, and lentils.  Milk and yogurt.  Fruit and juice.  Desserts, such as cakes, cookies, ice cream, and candy.  How does alcohol affect me? Alcohol can cause a sudden decrease in blood  glucose (hypoglycemia), especially if you use insulin or take certain oral diabetes medicines. Hypoglycemia can be a life-threatening condition. Symptoms of hypoglycemia (sleepiness, dizziness, and confusion) are similar to symptoms of having too much alcohol. If your health care provider says that alcohol is safe for you, follow these guidelines:  Limit alcohol intake to no more than 1 drink per day for nonpregnant women and 2 drinks per day for men. One drink equals 12 oz of beer, 5 oz of wine, or 1 oz of hard liquor.  Do not drink on an empty stomach.  Keep yourself hydrated with water, diet soda, or unsweetened iced tea.  Keep in mind that regular soda, juice, and other mixers may contain a lot of sugar and must be counted as carbohydrates.  What are tips for following this plan? Reading food labels  Start by checking the serving size on the label. The amount of calories, carbohydrates, fats, and other nutrients listed on the label are based on one serving of the food. Many foods contain more than one serving per package.  Check the total grams (g) of carbohydrates in one serving. You can calculate the number of servings of carbohydrates in one serving by dividing the total carbohydrates by 15. For example, if a food has 30 g of total carbohydrates, it would be equal to 2 servings of carbohydrates.  Check the number of grams (g) of saturated and trans fats in one serving. Choose foods that have low or no amount of these fats.  Check the number of milligrams (mg) of sodium in one serving. Most people   should limit total sodium intake to less than 2,300 mg per day.  Always check the nutrition information of foods labeled as "low-fat" or "nonfat". These foods may be higher in added sugar or refined carbohydrates and should be avoided.  Talk to your dietitian to identify your daily goals for nutrients listed on the label. Shopping  Avoid buying canned, premade, or processed foods. These  foods tend to be high in fat, sodium, and added sugar.  Shop around the outside edge of the grocery store. This includes fresh fruits and vegetables, bulk grains, fresh meats, and fresh dairy. Cooking  Use low-heat cooking methods, such as baking, instead of high-heat cooking methods like deep frying.  Cook using healthy oils, such as olive, canola, or sunflower oil.  Avoid cooking with butter, cream, or high-fat meats. Meal planning  Eat meals and snacks regularly, preferably at the same times every day. Avoid going long periods of time without eating.  Eat foods high in fiber, such as fresh fruits, vegetables, beans, and whole grains. Talk to your dietitian about how many servings of carbohydrates you can eat at each meal.  Eat 4-6 ounces of lean protein each day, such as lean meat, chicken, fish, eggs, or tofu. 1 ounce is equal to 1 ounce of meat, chicken, or fish, 1 egg, or 1/4 cup of tofu.  Eat some foods each day that contain healthy fats, such as avocado, nuts, seeds, and fish. Lifestyle   Check your blood glucose regularly.  Exercise at least 30 minutes 5 or more days each week, or as told by your health care provider.  Take medicines as told by your health care provider.  Do not use any products that contain nicotine or tobacco, such as cigarettes and e-cigarettes. If you need help quitting, ask your health care provider.  Work with a counselor or diabetes educator to identify strategies to manage stress and any emotional and social challenges. What are some questions to ask my health care provider?  Do I need to meet with a diabetes educator?  Do I need to meet with a dietitian?  What number can I call if I have questions?  When are the best times to check my blood glucose? Where to find more information:  American Diabetes Association: diabetes.org/food-and-fitness/food  Academy of Nutrition and Dietetics:  www.eatright.org/resources/health/diseases-and-conditions/diabetes  National Institute of Diabetes and Digestive and Kidney Diseases (NIH): www.niddk.nih.gov/health-information/diabetes/overview/diet-eating-physical-activity Summary  A healthy meal plan will help you control your blood glucose and maintain a healthy lifestyle.  Working with a diet and nutrition specialist (dietitian) can help you make a meal plan that is best for you.  Keep in mind that carbohydrates and alcohol have immediate effects on your blood glucose levels. It is important to count carbohydrates and to use alcohol carefully. This information is not intended to replace advice given to you by your health care provider. Make sure you discuss any questions you have with your health care provider. Document Released: 12/06/2004 Document Revised: 04/15/2016 Document Reviewed: 04/15/2016 Elsevier Interactive Patient Education  2018 [employer] Inc.  

## 2017-09-29 NOTE — Progress Notes (Signed)
Subjective:  Patient ID: Carmen Burns, female    DOB: October 31, 1960  Age: 57 y.o. MRN: [id number]  CC: Diabetes   HPI Carmen Burns  is a 57 year old female with a history of type 2 diabetes mellitus (A1c 6.8), diabetic neuropathy hypertension, hyperlipidemia, GERD who presents today for follow-up visit. She was commenced on Protonix at her last visit due to complains of reflux and reports resolution of reflux symptoms. She tolerates her glipizide, metformin and Januvia and denies hypoglycemia, visual concerns and her neuropathy is controlled on gabapentin.  Currently takes a statin and denies the presence of myalgias and she is compliant with her antihypertensive. With regards to healthcare maintenance evaluation for colonoscopy comes up on 10/08/2017 At this time she has no additional concerns.  Past Medical History:  Diagnosis Date  . Anemia 2013  . Diabetes mellitus without complication ([place])   . Hyperlipidemia   . Hypertension   . Refusal of blood transfusions as patient is Jehovah's Witness     Past Surgical History:  Procedure Laterality Date  . APPENDECTOMY    . DILITATION & CURRETTAGE/HYSTROSCOPY WITH NOVASURE ABLATION N/A 01/12/2013   Procedure: DILATATION & CURETTAGE/HYSTEROSCOPY WITH NOVASURE ABLATION;  Surgeon: Osborne Oman, MD;  Location: [facility];  Service: Gynecology;  Laterality: N/A;    Allergies  Allergen Reactions  . Penicillins Itching and Rash     Outpatient Medications Prior to Visit  Medication Sig Dispense Refill  . atorvastatin (LIPITOR) 20 MG tablet Take 1 tablet (20 mg total) by mouth daily. 30 tablet 5  . Blood Glucose Monitoring Suppl (TRUE METRIX METER) W/DEVICE KIT USE TO CHECK BLOOD SUGAR THREE TIMES DAILY AND AT BEDTIME 1 kit 0  . cetirizine (ZYRTEC) 10 MG tablet Take 1 tablet (10 mg total) by mouth daily. 30 tablet 3  . gabapentin (NEURONTIN) 300 MG capsule Take 2 capsules (600 mg total) by mouth 2 (two) times daily. 120 capsule 5  .  glipiZIDE (GLUCOTROL) 10 MG tablet Take 1 tablet (10 mg total) by mouth 2 (two) times daily before a meal. 60 tablet 5  . glucose blood test strip USE TO CHECK BLOOD SUGAR THREE TIMES DAILY AND AT BEDTIME 100 each 12  . lidocaine (XYLOCAINE) 2 % solution Use as directed 10 mLs in the mouth or throat every 6 (six) hours as needed for mouth pain. 100 mL 0  . lisinopril-hydrochlorothiazide (PRINZIDE,ZESTORETIC) 10-12.5 MG tablet Take 1 tablet by mouth daily. 30 tablet 5  . metFORMIN (GLUCOPHAGE-XR) 500 MG 24 hr tablet Take 2 tablets (1,000 mg total) by mouth 2 (two) times daily. 2 tabs daily 120 tablet 5  . naproxen sodium (ANAPROX) 550 MG tablet TAKE 1 TABLET BY MOUTH 2 TIMES DAILY WITH A MEAL FOR PAIN 30 tablet 2  . pantoprazole (PROTONIX) 40 MG tablet Take 1 tablet (40 mg total) by mouth daily. 30 tablet 5  . polyethylene glycol powder (GLYCOLAX/MIRALAX) powder Take 17 g by mouth 2 (two) times daily as needed. 3350 g 1  . sitaGLIPtin (JANUVIA) 100 MG tablet Take 1 tablet (100 mg total) by mouth daily. 30 tablet 5  . TRUEPLUS LANCETS 28G MISC USE TO CHECK BLOOD SUGAR THREE TIMES DAILY AND AT BEDTIME 100 each 2   Facility-Administered Medications Prior to Visit  Medication Dose Route Frequency Provider Last Rate Last Dose  . 0.9 %  sodium chloride infusion  500 mL Intravenous Continuous Armbruster, Carlota Raspberry, MD        ROS Review of Systems  Constitutional: Negative  for activity change, appetite change and fatigue.  HENT: Negative for congestion, sinus pressure and sore throat.   Eyes: Negative for visual disturbance.  Respiratory: Negative for cough, chest tightness, shortness of breath and wheezing.   Cardiovascular: Negative for chest pain and palpitations.  Gastrointestinal: Negative for abdominal distention, abdominal pain and constipation.  Endocrine: Negative for polydipsia.  Genitourinary: Negative for dysuria and frequency.  Musculoskeletal: Negative for arthralgias and back pain.    Skin: Negative for rash.  Neurological: Negative for tremors, light-headedness and numbness.  Hematological: Does not bruise/bleed easily.  Psychiatric/Behavioral: Negative for agitation and behavioral problems.    Objective:  BP 137/80   Pulse (!) 113   Temp 98 F (36.7 C) (Oral)   Ht 5' 5"  (1.651 m)   Wt 220 lb 6.4 oz (100 kg)   SpO2 98%   BMI 36.68 kg/m   BP/Weight 09/29/2017 03/28/4816 07/29/3147  Systolic BP 702 637 858  Diastolic BP 80 84 82  Wt. (Lbs) 220.4 215.6 211  BMI 36.68 35.88 35.11      Physical Exam  Constitutional: She is oriented to person, place, and time. She appears well-developed and well-nourished.  Cardiovascular: Normal heart sounds and intact distal pulses. Tachycardia present.  No murmur heard. Pulmonary/Chest: Effort normal and breath sounds normal. She has no wheezes. She has no rales. She exhibits no tenderness.  Abdominal: Soft. Bowel sounds are normal. She exhibits no distension and no mass. There is no tenderness.  Musculoskeletal: Normal range of motion.  Neurological: She is alert and oriented to person, place, and time.  Skin: Skin is warm and dry.  Psychiatric: She has a normal mood and affect.     CMP Latest Ref Rng & Units 11/27/2016 01/19/2016 03/22/2015  Glucose 65 - 99 mg/dL 301(H) 113(H) 213(H)  BUN 6 - 24 mg/dL 12 14 11   Creatinine 0.57 - 1.00 mg/dL 0.72 0.88 0.71  Sodium 134 - 144 mmol/L 135 140 137  Potassium 3.5 - 5.2 mmol/L 4.6 4.4 4.3  Chloride 96 - 106 mmol/L 96 107 101  CO2 20 - 29 mmol/L 25 25 23   Calcium 8.7 - 10.2 mg/dL 9.3 9.4 9.5  Total Protein 6.0 - 8.5 g/dL 7.7 7.0 -  Total Bilirubin 0.0 - 1.2 mg/dL 0.3 0.5 -  Alkaline Phos 39 - 117 IU/L 146(H) 88 -  AST 0 - 40 IU/L 34 18 -  ALT 0 - 32 IU/L 34(H) 13 -    Lipid Panel     Component Value Date/Time   CHOL 143 04/04/2017 1020   TRIG 133 04/04/2017 1020   HDL 53 04/04/2017 1020   CHOLHDL 2.7 04/04/2017 1020   CHOLHDL 3.2 01/19/2016 1043   VLDL 31 (H)  01/19/2016 1043   LDLCALC 63 04/04/2017 1020    Lab Results  Component Value Date   HGBA1C 6.8 06/27/2017    Assessment & Plan:   1. Diabetes mellitus due to underlying condition with hyperosmolarity without coma, without long-term current use of insulin (HCC) Controlled with A1c of 6.8 With send of A1c and adjust regimen accordingly Counseled on Diabetic diet, my plate method, 850 minutes of moderate intensity exercise/week Keep blood sugar logs with fasting goals of 80-120 mg/dl, random of less than 180 and in the event of sugars less than 60 mg/dl or greater than 400 mg/dl please notify the clinic ASAP. It is recommended that you undergo annual eye exams and annual foot exams. Pneumonia vaccine is recommended. - POCT glucose (manual entry) - Hemoglobin  A1c - Microalbumin/Creatinine Ratio, Urine - CMP14+EGFR  2. Hypertension, unspecified type Controlled Continue antihypertensive Counseled on blood pressure goal of less than 130/80, low-sodium, DASH diet, medication compliance, 150 minutes of moderate intensity exercise per week. Discussed medication compliance, adverse effects.  3. Other diabetic neurological complication associated with type 2 diabetes mellitus ([place]) Doing well on gabapentin  4. Gastroesophageal reflux disease without esophagitis Controlled on Protonix  5. Pure hypercholesterolemia Doing well on Lipitor Low-cholesterol diet, lifestyle modifications   No orders of the defined types were placed in this encounter.   Follow-up: Return in about 3 months (around 12/30/2017) for Follow-up of chronic medical conditions.   Charlott Rakes MD

## 2017-09-30 ENCOUNTER — Other Ambulatory Visit: Payer: Self-pay | Admitting: Family Medicine

## 2017-09-30 LAB — CMP14+EGFR
ALK PHOS: 132 IU/L — AB (ref 39–117)
ALT: 23 IU/L (ref 0–32)
AST: 24 IU/L (ref 0–40)
Albumin/Globulin Ratio: 1.3 (ref 1.2–2.2)
Albumin: 4.2 g/dL (ref 3.5–5.5)
BUN/Creatinine Ratio: 16 (ref 9–23)
BUN: 13 mg/dL (ref 6–24)
Bilirubin Total: <0.2 mg/dL (ref 0.0–1.2)
CALCIUM: 9.9 mg/dL (ref 8.7–10.2)
CO2: 22 mmol/L (ref 20–29)
CREATININE: 0.8 mg/dL (ref 0.57–1.00)
Chloride: 101 mmol/L (ref 96–106)
GFR calc Af Amer: 94 mL/min/{1.73_m2} (ref >59)
GFR, EST NON AFRICAN AMERICAN: 82 mL/min/{1.73_m2} (ref >59)
GLOBULIN, TOTAL: 3.2 g/dL (ref 1.5–4.5)
GLUCOSE: 145 mg/dL — AB (ref 65–99)
Potassium: 4.2 mmol/L (ref 3.5–5.2)
SODIUM: 142 mmol/L (ref 134–144)
Total Protein: 7.4 g/dL (ref 6.0–8.5)

## 2017-09-30 LAB — MICROALBUMIN / CREATININE URINE RATIO
CREATININE, UR: 57.1 mg/dL
MICROALBUM., U, RANDOM: 4.9 ug/mL
Microalb/Creat Ratio: 8.6 mg/g creat (ref 0.0–30.0)

## 2017-09-30 LAB — HEMOGLOBIN A1C
Est. average glucose Bld gHb Est-mCnc: 183 mg/dL
Hgb A1c MFr Bld: 8 % — ABNORMAL HIGH (ref 4.8–5.6)

## 2017-10-08 ENCOUNTER — Ambulatory Visit (INDEPENDENT_AMBULATORY_CARE_PROVIDER_SITE_OTHER): Payer: Self-pay | Admitting: Gastroenterology

## 2017-10-08 ENCOUNTER — Encounter: Payer: Self-pay | Admitting: Gastroenterology

## 2017-10-08 VITALS — BP 136/88 | HR 88 | Ht 63.0 in | Wt 221.5 lb

## 2017-10-08 DIAGNOSIS — Z86010 Personal history of colonic polyps: Secondary | ICD-10-CM

## 2017-10-08 MED ORDER — SUPREP BOWEL PREP KIT 17.5-3.13-1.6 GM/177ML PO SOLN: ORAL | 0 refills | Status: DC

## 2017-10-08 NOTE — Progress Notes (Signed)
HPI :  57 year old female here for a follow-up visit for colon polyps. She was initially seen for her first colonoscopy in June 2017, at which time she had a large sessile serrated polyp around the appendiceal orifice. This was removed in piecemeal fashion. She had a follow-up colonoscopy 5 months later which showed a small area of residual sessile serrated polyp at the appendiceal orifice, difficult to remove due to location at the appendiceal orifice and scar tissue from prior polypectomy and appendectomy. We reached out to her multiple times to discuss follow-up plans for this colon polyp, this is her first time coming back to see [place] since her last colonoscopy.  He denies any problems with her bowels other than chronic constipation which is mild. She is using MiraLAX once daily which works okay, she asked if she can increase to this to higher dose if needed. She denies any blood in her stools. She denies any new abdominal pains.  Colonoscopy 09/20/2015 - large cecal polyp around AO removed in piecemeal - sessile serrated polyp Colonoscopy 01/31/2016 - residual polyp at the AO treated with piecemeal resection and coagulation   Past Medical History:  Diagnosis Date  . Anemia 2013  . Diabetes mellitus without complication ([place])   . Hyperlipidemia   . Hypertension   . Refusal of blood transfusions as patient is Jehovah's Witness      Past Surgical History:  Procedure Laterality Date  . APPENDECTOMY    . DILITATION & CURRETTAGE/HYSTROSCOPY WITH NOVASURE ABLATION N/A 01/12/2013   Procedure: DILATATION & CURETTAGE/HYSTEROSCOPY WITH NOVASURE ABLATION;  Surgeon: Osborne Oman, MD;  Location: [facility];  Service: Gynecology;  Laterality: N/A;   Family History  Problem Relation Age of Onset  . Diabetes Brother   . Hypertension Brother   . Colon cancer Neg Hx   . Breast cancer Neg Hx    Social History   Tobacco Use  . Smoking status: Never Smoker  . Smokeless tobacco: Never Used    Substance Use Topics  . Alcohol use: Yes    Alcohol/week: 0.6 oz    Types: 1 Glasses of wine per week    Comment: socially  . Drug use: No   Current Outpatient Medications  Medication Sig Dispense Refill  . atorvastatin (LIPITOR) 20 MG tablet Take 1 tablet (20 mg total) by mouth daily. 30 tablet 5  . Blood Glucose Monitoring Suppl (TRUE METRIX METER) W/DEVICE KIT USE TO CHECK BLOOD SUGAR THREE TIMES DAILY AND AT BEDTIME 1 kit 0  . cetirizine (ZYRTEC) 10 MG tablet Take 1 tablet (10 mg total) by mouth daily. 30 tablet 3  . gabapentin (NEURONTIN) 300 MG capsule Take 2 capsules (600 mg total) by mouth 2 (two) times daily. 120 capsule 5  . glipiZIDE (GLUCOTROL) 10 MG tablet Take 1 tablet (10 mg total) by mouth 2 (two) times daily before a meal. 60 tablet 5  . glucose blood test strip USE TO CHECK BLOOD SUGAR THREE TIMES DAILY AND AT BEDTIME 100 each 12  . lisinopril-hydrochlorothiazide (PRINZIDE,ZESTORETIC) 10-12.5 MG tablet Take 1 tablet by mouth daily. 30 tablet 5  . metFORMIN (GLUCOPHAGE-XR) 500 MG 24 hr tablet Take 2 tablets (1,000 mg total) by mouth 2 (two) times daily. 2 tabs daily 120 tablet 5  . naproxen sodium (ANAPROX) 550 MG tablet TAKE 1 TABLET BY MOUTH 2 TIMES DAILY WITH A MEAL FOR PAIN 30 tablet 2  . pantoprazole (PROTONIX) 40 MG tablet Take 1 tablet (40 mg total) by mouth daily. [place]  tablet 5  . polyethylene glycol powder (GLYCOLAX/MIRALAX) powder Take 17 g by mouth 2 (two) times daily as needed. 3350 g 1  . sitaGLIPtin (JANUVIA) 100 MG tablet Take 1 tablet (100 mg total) by mouth daily. 30 tablet 5  . TRUEPLUS LANCETS 28G MISC USE TO CHECK BLOOD SUGAR THREE TIMES DAILY AND AT BEDTIME 100 each 2   Current Facility-Administered Medications  Medication Dose Route Frequency Provider Last Rate Last Dose  . 0.9 %  sodium chloride infusion  500 mL Intravenous Continuous Yvon Mccord, Carlota Raspberry, MD       Allergies  Allergen Reactions  . Penicillins Itching and Rash     Review of  Systems: All systems reviewed and negative except where noted in HPI.    Lab Results  Component Value Date   WBC 8.4 03/22/2015   HGB 13.8 03/22/2015   HCT 41.9 03/22/2015   MCV 90.9 03/22/2015   PLT 286 03/22/2015    Lab Results  Component Value Date   CREATININE 0.80 09/29/2017   BUN 13 09/29/2017   NA 142 09/29/2017   K 4.2 09/29/2017   CL 101 09/29/2017   CO2 22 09/29/2017     Physical Exam: BP 136/88 (BP Location: Left Arm, Patient Position: Sitting, Cuff Size: Normal)   Pulse 88   Ht 5' 3"  (1.6 m) Comment: height measured without shoes  Wt 221 lb 8 oz (100.5 kg)   LMP 06/20/2015 Comment: no chance pregnant,husband has had vasectomy pt sates  BMI 39.24 kg/m  Constitutional: Pleasant,well-developed, female in no acute distress. HEENT: Normocephalic and atraumatic. Conjunctivae are normal. No scleral icterus. Neck supple.  Cardiovascular: Normal rate, regular rhythm.  Pulmonary/chest: Effort normal and breath sounds normal. No wheezing, rales or rhonchi. Abdominal: Soft, nondistended, nontender.  There are no masses palpable. No hepatomegaly. Extremities: no edema Lymphadenopathy: No cervical adenopathy noted. Neurological: Alert and oriented to person place and time. Skin: Skin is warm and dry. No rashes noted. Psychiatric: Normal mood and affect. Behavior is normal.   ASSESSMENT AND PLAN: 57 year old female here for reassessment following issue:  Sessile serrated colon polyps - as above she had a large sessile serrated polyp around the appendiceal orifice in 2017, removed in piecemeal fashion. Fortunately she has a history of an appendectomy, however given the location of this lesion near the appendiceal orifice it has been technically challenging to remove. Her last colonoscopy showed a small area of residual polyp, again removed and treated with coagulation. I discussed risks of recurrence for this type of lesion. I'm recommending a follow-up colonoscopy at this  time to see if there is any further residual polyp. If it again is there and cannot successfully be removed endoscopically, she may warrant surgical resection. I discussed these issues with her and her friend who is providing translation today. She verbalized understanding of the issues. She is agreeable to a colonoscopy at this time after discussion of risks and benefits. Further recommendations pending results of that.  [place] Cellar, MD [facility]

## 2017-10-08 NOTE — Patient Instructions (Addendum)
If you are age 59 or older, your body mass index should be between 23-30. Your Body mass index is 39.24 kg/m. If this is out of the aforementioned range listed, please consider follow up with your Primary Care Provider.  If you are age 42 or younger, your body mass index should be between 19-25. Your Body mass index is 39.24 kg/m. If this is out of the aformentioned range listed, please consider follow up with your Primary Care Provider.   You have been scheduled for a colonoscopy. Please follow written instructions given to you at your visit today.  Please pick up your prep supplies at the pharmacy within the next 1-3 days. If you use inhalers (even only as needed), please bring them with you on the day of your procedure. Your physician has requested that you go to www.startemmi.com and enter the access code given to you at your visit today. This web site gives a general overview about your procedure. However, you should still follow specific instructions given to you by our office regarding your preparation for the procedure.     Thank you for entrusting me with your care and for choosing [facility], Dr. [place] Cellar

## 2017-10-24 ENCOUNTER — Other Ambulatory Visit: Payer: Self-pay | Admitting: Family Medicine

## 2017-11-27 ENCOUNTER — Encounter: Payer: Self-pay | Admitting: Gastroenterology

## 2017-11-27 ENCOUNTER — Ambulatory Visit (AMBULATORY_SURGERY_CENTER): Payer: Self-pay | Admitting: Gastroenterology

## 2017-11-27 VITALS — BP 128/70 | HR 64 | Temp 98.7°F | Resp 20 | Ht 63.0 in | Wt 221.0 lb

## 2017-11-27 DIAGNOSIS — K6389 Other specified diseases of intestine: Secondary | ICD-10-CM

## 2017-11-27 DIAGNOSIS — K635 Polyp of colon: Secondary | ICD-10-CM

## 2017-11-27 DIAGNOSIS — Z86010 Personal history of colonic polyps: Secondary | ICD-10-CM

## 2017-11-27 DIAGNOSIS — D120 Benign neoplasm of cecum: Secondary | ICD-10-CM

## 2017-11-27 DIAGNOSIS — D125 Benign neoplasm of sigmoid colon: Secondary | ICD-10-CM

## 2017-11-27 MED ORDER — SODIUM CHLORIDE 0.9 % IV SOLN: 500.0000 mL | Freq: Once | INTRAVENOUS | Status: DC

## 2017-11-27 NOTE — Op Note (Addendum)
[place] Patient Name: Carmen Burns Procedure Date: 11/27/2017 8:27 AM MRN: [id number] Endoscopist: Remo Lipps P. Havery Moros , MD Age: 57 Referring MD:  Date of Birth: 02-Mar-1961 Gender: Female Account #: [id number] Procedure:                Colonoscopy Indications:              High risk colon cancer surveillance: Personal                            history of colonic polyps - large polyp in 2017                            removed in piecemeal at [facility] (history of                            appendectomy), difficult polypectomy, small area of                            recurrence treated in 2017, here for surveillance Medicines:                Monitored Anesthesia Care Procedure:                Pre-Anesthesia Assessment:                           - Prior to the procedure, a History and Physical                            was performed, and patient medications and                            allergies were reviewed. The patient's tolerance of                            previous anesthesia was also reviewed. The risks                            and benefits of the procedure and the sedation                            options and risks were discussed with the patient.                            All questions were answered, and informed consent                            was obtained. Prior Anticoagulants: The patient has                            taken no previous anticoagulant or antiplatelet                            agents. ASA Grade Assessment: II - A patient with  mild systemic disease. After reviewing the risks                            and benefits, the patient was deemed in                            satisfactory condition to undergo the procedure.                           After obtaining informed consent, the colonoscope                            was passed under direct vision. Throughout the                            procedure, the  patient's blood pressure, pulse, and                            oxygen saturations were monitored continuously. The                            Colonoscope was introduced through the anus and                            advanced to the the cecum, identified by                            appendiceal orifice and ileocecal valve. The                            colonoscopy was performed without difficulty. The                            patient tolerated the procedure well. The quality                            of the bowel preparation was good. The ileocecal                            valve, appendiceal orifice, and rectum were                            photographed. Scope In: 8:32:46 AM Scope Out: 2:68:34 AM Scope Withdrawal Time: 0 hours 20 minutes 7 seconds  Total Procedure Duration: 0 hours 23 minutes 33 seconds  Findings:                 The perianal and digital rectal examinations were                            normal.                           A medium post polypectomy scar was found  appendiceal orifice. The scar looked good in                            general. There was a focal 2-70mm area of either                            scar tissue versus residual polypoid tissue. It was                            removed with cold snare and then base around it was                            biopsied were taken with a cold forceps for                            histology.                           A diminutive polyp was found in the cecum. The                            polyp was sessile. The polyp was removed with a                            cold biopsy forceps. Resection and retrieval were                            complete.                           A 4 mm polyp was found in the sigmoid colon. The                            polyp was sessile. The polyp was removed with a                            cold snare. Resection and retrieval were complete.                            There was one diverticulum appreciated in the                            cecum. the exam was otherwise without abnormality. Complications:            No immediate complications. Estimated blood loss:                            Minimal. Estimated Blood Loss:     Estimated blood loss was minimal. Impression:               - Post-polypectomy scar at the appendiceal orifice.                            Diminutive area of either scar tissue versus  residual polypoid tissue. Snared / biopsied.                           - One diminutive polyp in the cecum, removed with a                            cold biopsy forceps. Resected and retrieved.                           - One 4 mm polyp in the sigmoid colon, removed with                            a cold snare. Resected and retrieved.                           - Cecal diverticulum                           - The examination was otherwise normal. Recommendation:           - Patient has a contact number available for                            emergencies. The signs and symptoms of potential                            delayed complications were discussed with the                            patient. Return to normal activities tomorrow.                            Written discharge instructions were provided to the                            patient.                           - Resume previous diet.                           - Continue present medications.                           - Await pathology results.                           - Repeat colonoscopy in 3 years for surveillance. Remo Lipps P. Britlee Skolnik, MD 11/27/2017 9:04:02 AM This report has been signed electronically.

## 2017-11-27 NOTE — Progress Notes (Signed)
Patient is Jehovah's witness and does not accept blood transfusions. Friend of patient requests to nurse Sarah she would like patient to have pediatric scope during procedure. MD and tech made aware.

## 2017-11-27 NOTE — Progress Notes (Signed)
Called to room to assist during endoscopic procedure.  Patient ID and intended procedure confirmed with present staff. Received instructions for my participation in the procedure from the performing physician.  

## 2017-11-27 NOTE — Progress Notes (Signed)
Alert and oriented x3, pleased with MAC, report to RN  

## 2017-11-27 NOTE — Progress Notes (Signed)
Pt's states no medical or surgical changes since previsit or office visit. 

## 2017-11-27 NOTE — Progress Notes (Signed)
Interpreter used today at the [facility] for this pt.  Interpreter's name is- Scientist, research (life sciences).

## 2017-11-27 NOTE — Patient Instructions (Addendum)
Handouts:  Polyps  YOU HAD AN ENDOSCOPIC PROCEDURE TODAY AT [facility]:   Refer to the procedure report that was given to you for any specific questions about what was found during the examination.  If the procedure report does not answer your questions, please call your gastroenterologist to clarify.  If you requested that your care partner not be given the details of your procedure findings, then the procedure report has been included in a sealed envelope for you to review at your convenience later.  YOU SHOULD EXPECT: Some feelings of bloating in the abdomen. Passage of more gas than usual.  Walking can help get rid of the air that was put into your GI tract during the procedure and reduce the bloating. If you had a lower endoscopy (such as a colonoscopy or flexible sigmoidoscopy) you may notice spotting of blood in your stool or on the toilet paper. If you underwent a bowel prep for your procedure, you may not have a normal bowel movement for a few days.  Please Note:  You might notice some irritation and congestion in your nose or some drainage.  This is from the oxygen used during your procedure.  There is no need for concern and it should clear up in a day or so.  SYMPTOMS TO REPORT IMMEDIATELY:   Following lower endoscopy (colonoscopy or flexible sigmoidoscopy):  Excessive amounts of blood in the stool  Significant tenderness or worsening of abdominal pains  Swelling of the abdomen that is new, acute  Fever of 100F or higher  For urgent or emergent issues, a gastroenterologist can be reached at any hour by calling [phone].   DIET:  We do recommend a small meal at first, but then you may proceed to your regular diet.  Drink plenty of fluids but you should avoid alcoholic beverages for 24 hours.  ACTIVITY:  You should plan to take it easy for the rest of today and you should NOT DRIVE or use heavy machinery until tomorrow (because of the sedation medicines used  during the test).    FOLLOW UP: Our staff will call the number listed on your records the next business day following your procedure to check on you and address any questions or concerns that you may have regarding the information given to you following your procedure. If we do not reach you, we will leave a message.  However, if you are feeling well and you are not experiencing any problems, there is no need to return our call.  We will assume that you have returned to your regular daily activities without incident.  If any biopsies were taken you will be contacted by phone or by letter within the next 1-3 weeks.  Please call us at [phone] if you have not heard about the biopsies in 3 weeks.    SIGNATURES/CONFIDENTIALITY: You and/or your care partner have signed paperwork which will be entered into your electronic medical record.  These signatures attest to the fact that that the information above on your After Visit Summary has been reviewed and is understood.  Full responsibility of the confidentiality of this discharge information lies with you and/or your care-partner.USTED TUVO UN PROCEDIMIENTO ENDOSCPICO HOY EN EL  ENDOSCOPY CENTER:   Lea el informe del procedimiento que se le entreg para cualquier pregunta especfica sobre lo que se Primary school teacher.  Si el informe del examen no responde a sus preguntas, por favor llame a su gastroenterlogo para aclararlo.  Si usted solicit que no se le den [employer] de lo que se Estate manager/land agent en su procedimiento al [employer] va a cuidar, entonces el informe del procedimiento se ha incluido en un sobre sellado para que usted lo revise despus cuando le sea ms conveniente.   LO QUE PUEDE ESPERAR: Algunas sensaciones de hinchazn en el abdomen.  Puede tener ms gases de lo normal.  El caminar puede ayudarle a eliminar el aire que se le puso en el tracto gastrointestinal durante el procedimiento y reducir la hinchazn.  Si le hicieron  una endoscopia inferior (como una colonoscopia o una sigmoidoscopia flexible), podra notar manchas de sangre en las heces fecales o en el papel higinico.  Si se someti a una preparacin intestinal para su procedimiento, es posible que no tenga una evacuacin intestinal normal durante [employer].   Tenga en cuenta:  Es posible que note un poco de irritacin y congestin en la nariz o algn drenaje.  Esto es debido al oxgeno [employer] durante su procedimiento.  No hay que preocuparse y esto debe desaparecer ms o Scientist, research (medical).   SNTOMAS PARA REPORTAR INMEDIATAMENTE:  Despus de una endoscopia inferior (colonoscopia o sigmoidoscopia flexible):  Cantidades excesivas de sangre en las heces fecales  Sensibilidad significativa o empeoramiento de los dolores abdominales   Hinchazn aguda del abdomen que antes no tena   Fiebre de 100F o ms   Despus de la endoscopia superior (EGD)  Vmitos de Biochemist, clinical o material como caf molido   Dolor en el pecho o dolor debajo de los omplatos que antes no tena   Dolor o dificultad persistente para tragar  Falta de aire que antes no tena   Fiebre de 100F o ms  Heces fecales negras y pegajosas   Para asuntos urgentes o de Freight forwarder, puede comunicarse con un gastroenterlogo a cualquier hora llamando al [phone].  DIETA:  Recomendamos una comida pequea al principio, pero luego puede continuar con su dieta normal.  Tome muchos lquidos, Teacher, adult education las bebidas alcohlicas durante 24 horas.    ACTIVIDAD:  Debe planear tomarse las cosas con calma por el resto del da y no debe CONDUCIR ni usar maquinaria pesada Programmer, applications (debido a los medicamentos de sedacin utilizados durante el examen).     SEGUIMIENTO: Nuestro personal llamar al nmero que aparece en su historial al siguiente da hbil de su procedimiento para ver cmo se siente y para responder cualquier pregunta o inquietud que pueda tener con respecto a la informacin que se le  dio despus del procedimiento. Si no podemos contactarle, le dejaremos un mensaje.  Sin embargo, si se siente bien y no tiene Paediatric nurse, no es necesario que nos devuelva la llamada.  Asumiremos que ha regresado a sus actividades diarias normales sin incidentes. Si se le tomaron algunas biopsias, le contactaremos por telfono o por carta en las prximas 3 semanas.  Si no ha sabido [employer] biopsias en el transcurso de 3 semanas, por favor llmenos al [phone].   FIRMAS/CONFIDENCIALIDAD: Usted y/o el acompaante que le cuide han firmado documentos que se ingresarn en su historial mdico electrnico.  Estas firmas atestiguan el hecho de que la informacin anterior

## 2017-11-28 ENCOUNTER — Telehealth: Payer: Self-pay | Admitting: *Deleted

## 2017-11-28 ENCOUNTER — Telehealth: Payer: Self-pay

## 2017-11-28 NOTE — Telephone Encounter (Signed)
  Follow up Call-  Call back number 11/27/2017 01/31/2016 09/20/2015  Post procedure Call Back phone  # [phone] daughter in law, Raymon Mutton [phone]  Permission to leave phone message Yes Yes Yes  Some recent data might be hidden     Patient questions:  Message left to call us if necessary.  Second call.

## 2017-11-28 NOTE — Telephone Encounter (Signed)
No answer, left message to call back later today, B.Iren Whipp RN. 

## 2017-12-02 ENCOUNTER — Encounter: Payer: Self-pay | Admitting: Gastroenterology

## 2017-12-02 ENCOUNTER — Other Ambulatory Visit: Payer: Self-pay | Admitting: Family Medicine

## 2017-12-04 ENCOUNTER — Other Ambulatory Visit: Payer: Self-pay

## 2017-12-30 ENCOUNTER — Ambulatory Visit: Payer: Self-pay | Attending: Family Medicine | Admitting: Family Medicine

## 2017-12-30 ENCOUNTER — Encounter: Payer: Self-pay | Admitting: Family Medicine

## 2017-12-30 VITALS — BP 138/90 | HR 91 | Temp 97.7°F | Ht 63.0 in | Wt 218.6 lb

## 2017-12-30 DIAGNOSIS — E1140 Type 2 diabetes mellitus with diabetic neuropathy, unspecified: Secondary | ICD-10-CM | POA: Insufficient documentation

## 2017-12-30 DIAGNOSIS — E1149 Type 2 diabetes mellitus with other diabetic neurological complication: Secondary | ICD-10-CM

## 2017-12-30 DIAGNOSIS — E0800 Diabetes mellitus due to underlying condition with hyperosmolarity without nonketotic hyperglycemic-hyperosmolar coma (NKHHC): Secondary | ICD-10-CM

## 2017-12-30 DIAGNOSIS — Z79899 Other long term (current) drug therapy: Secondary | ICD-10-CM | POA: Insufficient documentation

## 2017-12-30 DIAGNOSIS — Z880 Allergy status to penicillin: Secondary | ICD-10-CM | POA: Insufficient documentation

## 2017-12-30 DIAGNOSIS — Z7984 Long term (current) use of oral hypoglycemic drugs: Secondary | ICD-10-CM | POA: Insufficient documentation

## 2017-12-30 DIAGNOSIS — E1100 Type 2 diabetes mellitus with hyperosmolarity without nonketotic hyperglycemic-hyperosmolar coma (NKHHC): Secondary | ICD-10-CM | POA: Insufficient documentation

## 2017-12-30 DIAGNOSIS — E7800 Pure hypercholesterolemia, unspecified: Secondary | ICD-10-CM | POA: Insufficient documentation

## 2017-12-30 DIAGNOSIS — I10 Essential (primary) hypertension: Secondary | ICD-10-CM | POA: Insufficient documentation

## 2017-12-30 DIAGNOSIS — K219 Gastro-esophageal reflux disease without esophagitis: Secondary | ICD-10-CM | POA: Insufficient documentation

## 2017-12-30 LAB — POCT GLYCOSYLATED HEMOGLOBIN (HGB A1C): Hemoglobin A1C: 7.6 % — AB (ref 4.0–5.6)

## 2017-12-30 LAB — GLUCOSE, POCT (MANUAL RESULT ENTRY): POC GLUCOSE: 165 mg/dL — AB (ref 70–99)

## 2017-12-30 MED ORDER — ATORVASTATIN CALCIUM 20 MG PO TABS: 20.0000 mg | ORAL_TABLET | Freq: Every day | ORAL | 5 refills | Status: DC

## 2017-12-30 MED ORDER — LISINOPRIL-HYDROCHLOROTHIAZIDE 10-12.5 MG PO TABS: 1.0000 | ORAL_TABLET | Freq: Every day | ORAL | 5 refills | Status: DC

## 2017-12-30 MED ORDER — SITAGLIPTIN PHOSPHATE 100 MG PO TABS: 100.0000 mg | ORAL_TABLET | Freq: Every day | ORAL | 5 refills | Status: DC

## 2017-12-30 MED ORDER — GLIPIZIDE 10 MG PO TABS: 10.0000 mg | ORAL_TABLET | Freq: Two times a day (BID) | ORAL | 5 refills | Status: DC

## 2017-12-30 MED ORDER — GABAPENTIN 300 MG PO CAPS: 600.0000 mg | ORAL_CAPSULE | Freq: Two times a day (BID) | ORAL | 5 refills | Status: DC

## 2017-12-30 MED ORDER — METFORMIN HCL ER 500 MG PO TB24: 1000.0000 mg | ORAL_TABLET | Freq: Two times a day (BID) | ORAL | 5 refills | Status: DC

## 2017-12-30 NOTE — Progress Notes (Signed)
Subjective:  Patient ID: Carmen Burns, female    DOB: 08-23-1960  Age: 57 y.o. MRN: [id number]  CC: Diabetes   HPI Carmen Burns is a 57 year old female with a history of type 2 diabetes mellitus (A1c 7.6), diabetic neuropathy hypertension, hyperlipidemia, GERD who presents today for follow-up visit. Her A1c is 7.6 which has trended up from 6.8 previously and she has been compliant with her medications and denies change in her lifestyle.  Denies hypoglycemia, visual concerns or numbness in extremities. Doing well on her antihypertensive and denies adverse effects from her medications. She has not been taking Lipitor because she thought since her cholesterol is controlled she could come off it. Reflux symptoms have been controlled. She denies additional concerns today.  Past Medical History:  Diagnosis Date  . Anemia 2013  . Diabetes mellitus without complication ([place])   . Hyperlipidemia   . Hypertension   . Refusal of blood transfusions as patient is Jehovah's Witness     Past Surgical History:  Procedure Laterality Date  . APPENDECTOMY    . DILITATION & CURRETTAGE/HYSTROSCOPY WITH NOVASURE ABLATION N/A 01/12/2013   Procedure: DILATATION & CURETTAGE/HYSTEROSCOPY WITH NOVASURE ABLATION;  Surgeon: Osborne Oman, MD;  Location: [facility];  Service: Gynecology;  Laterality: N/A;    Allergies  Allergen Reactions  . Penicillins Itching and Rash     Outpatient Medications Prior to Visit  Medication Sig Dispense Refill  . Blood Glucose Monitoring Suppl (TRUE METRIX METER) W/DEVICE KIT USE TO CHECK BLOOD SUGAR THREE TIMES DAILY AND AT BEDTIME 1 kit 0  . cetirizine (ZYRTEC) 10 MG tablet Take 1 tablet (10 mg total) by mouth daily. 30 tablet 3  . glucose blood test strip USE TO CHECK BLOOD SUGAR THREE TIMES DAILY AND AT BEDTIME 100 each 12  . pantoprazole (PROTONIX) 40 MG tablet Take 1 tablet (40 mg total) by mouth daily. 30 tablet 5  . TRUEPLUS LANCETS 28G MISC USE TO CHECK  BLOOD SUGAR THREE TIMES DAILY AND AT BEDTIME 100 each 2  . gabapentin (NEURONTIN) 300 MG capsule Take 2 capsules (600 mg total) by mouth 2 (two) times daily. 120 capsule 5  . glipiZIDE (GLUCOTROL) 10 MG tablet Take 1 tablet (10 mg total) by mouth 2 (two) times daily before a meal. 60 tablet 5  . lisinopril-hydrochlorothiazide (PRINZIDE,ZESTORETIC) 10-12.5 MG tablet Take 1 tablet by mouth daily. 30 tablet 5  . metFORMIN (GLUCOPHAGE-XR) 500 MG 24 hr tablet Take 2 tablets (1,000 mg total) by mouth 2 (two) times daily. 2 tabs daily 120 tablet 5  . sitaGLIPtin (JANUVIA) 100 MG tablet Take 1 tablet (100 mg total) by mouth daily. 30 tablet 5  . naproxen sodium (ANAPROX) 550 MG tablet TAKE 1 TABLET BY MOUTH 2 TIMES DAILY WITH A MEAL FOR PAIN (Patient not taking: Reported on 12/30/2017) 30 tablet 1  . atorvastatin (LIPITOR) 20 MG tablet Take 1 tablet (20 mg total) by mouth daily. (Patient not taking: Reported on 11/27/2017) 30 tablet 5   No facility-administered medications prior to visit.     ROS Review of Systems  Constitutional: Negative for activity change, appetite change and fatigue.  HENT: Negative for congestion, sinus pressure and sore throat.   Eyes: Negative for visual disturbance.  Respiratory: Negative for cough, chest tightness, shortness of breath and wheezing.   Cardiovascular: Negative for chest pain and palpitations.  Gastrointestinal: Negative for abdominal distention, abdominal pain and constipation.  Endocrine: Negative for polydipsia.  Genitourinary: Negative for dysuria and frequency.  Musculoskeletal: Negative  for arthralgias and back pain.  Skin: Negative for rash.  Neurological: Negative for tremors, light-headedness and numbness.  Hematological: Does not bruise/bleed easily.  Psychiatric/Behavioral: Negative for agitation and behavioral problems.    Objective:  BP 138/90   Pulse 91   Temp 97.7 F (36.5 C) (Oral)   Ht 5' 3"  (1.6 m)   Wt 218 lb 9.6 oz (99.2 kg)   LMP  06/20/2015 Comment: no chance pregnant,husband has had vasectomy pt sates  SpO2 100%   BMI 38.72 kg/m   BP/Weight 12/30/2017 11/27/2017 09/30/6281  Systolic BP 662 947 654  Diastolic BP 90 70 88  Wt. (Lbs) 218.6 221 221.5  BMI 38.72 39.15 39.24      Physical Exam  Constitutional: She is oriented to person, place, and time. She appears well-developed and well-nourished.  Cardiovascular: Normal rate, normal heart sounds and intact distal pulses.  No murmur heard. Pulmonary/Chest: Effort normal and breath sounds normal. She has no wheezes. She has no rales. She exhibits no tenderness.  Abdominal: Soft. Bowel sounds are normal. She exhibits no distension and no mass. There is no tenderness.  Musculoskeletal: Normal range of motion.  Neurological: She is alert and oriented to person, place, and time.  Skin: Skin is warm and dry.  Psychiatric: She has a normal mood and affect.    CMP Latest Ref Rng & Units 09/29/2017 11/27/2016 01/19/2016  Glucose 65 - 99 mg/dL 145(H) 301(H) 113(H)  BUN 6 - 24 mg/dL 13 12 14   Creatinine 0.57 - 1.00 mg/dL 0.80 0.72 0.88  Sodium 134 - 144 mmol/L 142 135 140  Potassium 3.5 - 5.2 mmol/L 4.2 4.6 4.4  Chloride 96 - 106 mmol/L 101 96 107  CO2 20 - 29 mmol/L 22 25 25   Calcium 8.7 - 10.2 mg/dL 9.9 9.3 9.4  Total Protein 6.0 - 8.5 g/dL 7.4 7.7 7.0  Total Bilirubin 0.0 - 1.2 mg/dL <0.2 0.3 0.5  Alkaline Phos 39 - 117 IU/L 132(H) 146(H) 88  AST 0 - 40 IU/L 24 34 18  ALT 0 - 32 IU/L 23 34(H) 13    Lab Results  Component Value Date   HGBA1C 7.6 (A) 12/30/2017     Assessment & Plan:   1. Diabetes mellitus due to underlying condition with hyperosmolarity without coma, without long-term current use of insulin (HCC) A1c of 7.6 which has trended up from 6.8 previously No regimen change today Strongly encouraged to adhere to a diabetic diet and lifestyle modifications and we will reassess at next visit - POCT glycosylated hemoglobin (Hb A1C) - POCT glucose  (manual entry) - metFORMIN (GLUCOPHAGE-XR) 500 MG 24 hr tablet; Take 2 tablets (1,000 mg total) by mouth 2 (two) times daily. 2 tabs daily  Dispense: 120 tablet; Refill: 5 - glipiZIDE (GLUCOTROL) 10 MG tablet; Take 1 tablet (10 mg total) by mouth 2 (two) times daily before a meal.  Dispense: 60 tablet; Refill: 5  2. Essential hypertension Controlled Counseled on blood pressure goal of less than 130/80, low-sodium, DASH diet, medication compliance, 150 minutes of moderate intensity exercise per week. Discussed medication compliance, adverse effects. - lisinopril-hydrochlorothiazide (PRINZIDE,ZESTORETIC) 10-12.5 MG tablet; Take 1 tablet by mouth daily.  Dispense: 30 tablet; Refill: 5  3. Other diabetic neurological complication associated with type 2 diabetes mellitus (HCC) Stable - gabapentin (NEURONTIN) 300 MG capsule; Take 2 capsules (600 mg total) by mouth 2 (two) times daily.  Dispense: 120 capsule; Refill: 5  4. Pure hypercholesterolemia Controlled She has stopped taking Lipitor due to  lipids been controlled Advised to resume Lipitor and encourage she will need to stay on this chronically Low-cholesterol diet - atorvastatin (LIPITOR) 20 MG tablet; Take 1 tablet (20 mg total) by mouth daily.  Dispense: 30 tablet; Refill: 5   Meds ordered this encounter  Medications  . sitaGLIPtin (JANUVIA) 100 MG tablet    Sig: Take 1 tablet (100 mg total) by mouth daily.    Dispense:  30 tablet    Refill:  5  . metFORMIN (GLUCOPHAGE-XR) 500 MG 24 hr tablet    Sig: Take 2 tablets (1,000 mg total) by mouth 2 (two) times daily. 2 tabs daily    Dispense:  120 tablet    Refill:  5  . lisinopril-hydrochlorothiazide (PRINZIDE,ZESTORETIC) 10-12.5 MG tablet    Sig: Take 1 tablet by mouth daily.    Dispense:  30 tablet    Refill:  5  . glipiZIDE (GLUCOTROL) 10 MG tablet    Sig: Take 1 tablet (10 mg total) by mouth 2 (two) times daily before a meal.    Dispense:  60 tablet    Refill:  5  . gabapentin  (NEURONTIN) 300 MG capsule    Sig: Take 2 capsules (600 mg total) by mouth 2 (two) times daily.    Dispense:  120 capsule    Refill:  5    Discontinue previous dose  . atorvastatin (LIPITOR) 20 MG tablet    Sig: Take 1 tablet (20 mg total) by mouth daily.    Dispense:  30 tablet    Refill:  5    Follow-up: Return in about 3 months (around 04/01/2018) for Follow-up of chronic medical conditions.   Charlott Rakes MD

## 2017-12-30 NOTE — Patient Instructions (Signed)
Opciones de alimentos para Therapist, nutritional de triglicridos (Food Choices to Lower Your Triglycerides) Los triglicridos son un tipo de grasas que se Chief Strategy Officer. Un nivel elevado de triglicridos puede aumentar el riesgo de padecer enfermedades cardacas e infartos. Si sus niveles de triglicridos son altos, los alimentos que se ingieren y los hbitos de alimentacin son Theatre stage manager. Elegir los alimentos adecuados puede ayudar a Therapist, nutritional de triglicridos. [place]?  Baje de peso si es necesario.  Limite o evite el alcohol.  Llene la mitad del plato con vegetales y ensaladas de hojas verdes.  [place] a dos porciones por da. Elija frutas en lugar de jugos.  Ocupe un cuarto del plato con cereales integrales. Busque la palabra "integral" en Equities trader de la lista de ingredientes.  Llene un cuarto del plato con alimentos con protenas magras.  Disfrute de pescados grasos (como salmn, caballa, sardinas y atn) tres veces por semana.  [place] grasas saludables.  Limite los alimentos con alto contenido de almidn y Location manager.  Consuma ms comida casera y menos de restaurante, de buf y comida rpida.  Limite el consumo de alimentos fritos.  Cocine los alimentos utilizando mtodos que no sean la fritura.  Limite el consumo de grasas saturadas.  Verifique las listas de ingredientes para evitar alimentos con aceites parcialmente hidrogenados (grasas trans).  QU ALIMENTOS PUEDO COMER? Cereales Cereales integrales, como los panes de salvado o [place], las [place], los cereales y las pastas. Avena sin endulzar, trigo, [place], quinua o arroz integral. Tortillas de harina de maz o de salvado. Vegetales Verduras frescas o congeladas (crudas, al vapor, asadas o grilladas). Ensaladas de hojas verdes. Fruits Frutas frescas, en conserva (en su jugo natural) o frutas congeladas. Carnes y otros productos con protenas Carne de  res molida (al 85% o ms [place]), carne de res de animales alimentados con pastos o carne de res sin la grasa. Pollo o pavo sin piel. Carne de pollo o de [place]. Cerdo sin la grasa. Todos los pescados y frutos de mar. Huevos. Porotos, guisantes o lentejas secos. Frutos secos o semillas sin sal. Frijoles secos o en lata sin sal. Lcteos Productos lcteos con bajo contenido de grasas, como [place] o al 1%, quesos reducidos en grasas o al 2%, ricota con bajo contenido de grasas o [employer], o yogur natural con bajo contenido de [place]. Grasas y Naval architect en barra que no contengan grasas trans. [place] y condimentos para ensaladas livianos o reducidos en grasas. [place]. Aceites de crtamo, oliva o canola. [place] natural de man o almendra. Los artculos mencionados arriba pueden no ser [employer] de las bebidas o los alimentos recomendados. Comunquese con el nutricionista para conocer ms opciones. QU ALIMENTOS NO SE RECOMIENDAN? Cereales Pan blanco. Pastas blancas. Arroz blanco. Pan de maz. Bagels, pasteles y croissants. Galletas saladas que contengan grasas trans. Vegetales Papas blancas. Maz. Vegetales con crema o fritos. Verduras en [place]. Fruits Frutas secas. Fruta enlatada en almbar liviano o espeso. Jugo de frutas. Carnes y otros productos con protenas [place] de carne con Lobbyist. Costillas, alas de pollo, tocineta, salchicha, mortadela, salame, chinchulines, tocino, perros calientes, salchichas alemanas y embutidos envasados. Lcteos Leche entera o al 2%, crema, mezcla de [place] y crema y queso crema. Yogur entero o endulzado. Quesos con toda su grasa. [place] no lcteas y coberturas batidas. Quesos procesados, quesos para untar o cuajadas. Dulces y postres Jarabe de maz, azcares, miel  y Control and instrumentation engineer. Caramelos. Mermelada y [place]. Chrissie Noa. Cereales endulzados. Galletas, pasteles, bizcochuelos, donas, muffins y helado. Grasas y  aceites [place], [place] en barra, Flora de [place], [place], [place] clarificada o grasa de tocino. Aceites de coco, de palmiste o de palma. Bebidas Alcohol. Bebidas endulzadas (como refrescos, limonadas y bebidas frutales o ponches). Los artculos mencionados arriba pueden no ser [employer] de las bebidas y los alimentos que se Higher education careers adviser. Comunquese con el nutricionista para recibir ms informacin. Esta informacin no tiene Marine scientist el consejo del mdico. Asegrese de hacerle al mdico cualquier pregunta que tenga. Document Released: 08/29/2009 Document Revised: 03/16/2013 Document Reviewed: 01/13/2013 Elsevier Interactive Patient Education  2017 [employer].

## 2017-12-31 ENCOUNTER — Other Ambulatory Visit: Payer: Self-pay | Admitting: Family Medicine

## 2018-01-27 ENCOUNTER — Other Ambulatory Visit: Payer: Self-pay | Admitting: Family Medicine

## 2018-01-27 DIAGNOSIS — K219 Gastro-esophageal reflux disease without esophagitis: Secondary | ICD-10-CM

## 2018-03-31 ENCOUNTER — Ambulatory Visit: Payer: Self-pay | Admitting: Family Medicine

## 2018-04-13 ENCOUNTER — Ambulatory Visit: Payer: Self-pay | Attending: Family Medicine | Admitting: Family Medicine

## 2018-04-13 ENCOUNTER — Encounter: Payer: Self-pay | Admitting: Family Medicine

## 2018-04-13 VITALS — BP 132/82 | HR 78 | Temp 97.7°F | Ht 63.0 in | Wt 216.8 lb

## 2018-04-13 DIAGNOSIS — E1149 Type 2 diabetes mellitus with other diabetic neurological complication: Secondary | ICD-10-CM | POA: Insufficient documentation

## 2018-04-13 DIAGNOSIS — E1100 Type 2 diabetes mellitus with hyperosmolarity without nonketotic hyperglycemic-hyperosmolar coma (NKHHC): Secondary | ICD-10-CM | POA: Insufficient documentation

## 2018-04-13 DIAGNOSIS — E785 Hyperlipidemia, unspecified: Secondary | ICD-10-CM | POA: Insufficient documentation

## 2018-04-13 DIAGNOSIS — E0800 Diabetes mellitus due to underlying condition with hyperosmolarity without nonketotic hyperglycemic-hyperosmolar coma (NKHHC): Secondary | ICD-10-CM

## 2018-04-13 DIAGNOSIS — Z880 Allergy status to penicillin: Secondary | ICD-10-CM | POA: Insufficient documentation

## 2018-04-13 DIAGNOSIS — E7800 Pure hypercholesterolemia, unspecified: Secondary | ICD-10-CM | POA: Insufficient documentation

## 2018-04-13 DIAGNOSIS — G8929 Other chronic pain: Secondary | ICD-10-CM | POA: Insufficient documentation

## 2018-04-13 DIAGNOSIS — E1140 Type 2 diabetes mellitus with diabetic neuropathy, unspecified: Secondary | ICD-10-CM | POA: Insufficient documentation

## 2018-04-13 DIAGNOSIS — Z7984 Long term (current) use of oral hypoglycemic drugs: Secondary | ICD-10-CM | POA: Insufficient documentation

## 2018-04-13 DIAGNOSIS — Z79899 Other long term (current) drug therapy: Secondary | ICD-10-CM | POA: Insufficient documentation

## 2018-04-13 DIAGNOSIS — M25562 Pain in left knee: Secondary | ICD-10-CM | POA: Insufficient documentation

## 2018-04-13 DIAGNOSIS — I10 Essential (primary) hypertension: Secondary | ICD-10-CM | POA: Insufficient documentation

## 2018-04-13 LAB — GLUCOSE, POCT (MANUAL RESULT ENTRY): POC GLUCOSE: 149 mg/dL — AB (ref 70–99)

## 2018-04-13 MED ORDER — NAPROXEN SODIUM 550 MG PO TABS: 550.0000 mg | ORAL_TABLET | Freq: Two times a day (BID) | ORAL | 1 refills | Status: DC

## 2018-04-13 NOTE — Progress Notes (Signed)
Subjective:  Patient ID: Carmen Burns, female    DOB: [birth year]  Age: 58 y.o. MRN: [id number]  CC: Diabetes   HPI Carmen Burns  is a 58 year old female with a history of type 2 diabetes mellitus (A1c 7.6), diabetic neuropathy hypertension, hyperlipidemia, GERD who presents today for follow-up visit. She complains of left knee pain ever since she ran out of naproxen.  Pain is described as mild and located on the medial aspect of her left knee but does not radiate.  Sometimes notices slight swelling.  Doing well on her diabetic medications and denies hypoglycemia, visual concerns.  Diabetic neuropathy is controlled on the current regimen. Doing well on her antihypertensive and tolerating his statins with no adverse effects. She has no additional concerns today.  Past Medical History:  Diagnosis Date  . Anemia 2013  . Diabetes mellitus without complication ([place])   . Hyperlipidemia   . Hypertension   . Refusal of blood transfusions as patient is Jehovah's Witness     Past Surgical History:  Procedure Laterality Date  . APPENDECTOMY    . DILITATION & CURRETTAGE/HYSTROSCOPY WITH NOVASURE ABLATION N/A 01/12/2013   Procedure: DILATATION & CURETTAGE/HYSTEROSCOPY WITH NOVASURE ABLATION;  Surgeon: Osborne Oman, MD;  Location: [facility];  Service: Gynecology;  Laterality: N/A;    Allergies  Allergen Reactions  . Penicillins Itching and Rash     Outpatient Medications Prior to Visit  Medication Sig Dispense Refill  . atorvastatin (LIPITOR) 20 MG tablet Take 1 tablet (20 mg total) by mouth daily. 30 tablet 5  . Blood Glucose Monitoring Suppl (TRUE METRIX METER) W/DEVICE KIT USE TO CHECK BLOOD SUGAR THREE TIMES DAILY AND AT BEDTIME 1 kit 0  . cetirizine (ZYRTEC) 10 MG tablet Take 1 tablet (10 mg total) by mouth daily. 30 tablet 3  . gabapentin (NEURONTIN) 300 MG capsule Take 2 capsules (600 mg total) by mouth 2 (two) times daily. 120 capsule 5  . glipiZIDE (GLUCOTROL) 10 MG  tablet Take 1 tablet (10 mg total) by mouth 2 (two) times daily before a meal. 60 tablet 5  . glucose blood test strip USE TO CHECK BLOOD SUGAR THREE TIMES DAILY AND AT BEDTIME 100 each 12  . lisinopril-hydrochlorothiazide (PRINZIDE,ZESTORETIC) 10-12.5 MG tablet Take 1 tablet by mouth daily. 30 tablet 5  . metFORMIN (GLUCOPHAGE-XR) 500 MG 24 hr tablet Take 2 tablets (1,000 mg total) by mouth 2 (two) times daily. 2 tabs daily 120 tablet 5  . pantoprazole (PROTONIX) 40 MG tablet TAKE 1 TABLET (40 MG TOTAL) BY MOUTH DAILY. 30 tablet 2  . sitaGLIPtin (JANUVIA) 100 MG tablet Take 1 tablet (100 mg total) by mouth daily. 30 tablet 5  . TRUEPLUS LANCETS 28G MISC USE TO CHECK BLOOD SUGAR THREE TIMES DAILY AND AT BEDTIME 100 each 2  . naproxen sodium (ANAPROX) 550 MG tablet TAKE 1 TABLET BY MOUTH 2 TIMES DAILY WITH A MEAL FOR PAIN (Patient not taking: Reported on 12/30/2017) 30 tablet 1   No facility-administered medications prior to visit.     ROS Review of Systems  Constitutional: Negative for activity change, appetite change and fatigue.  HENT: Negative for congestion, sinus pressure and sore throat.   Eyes: Negative for visual disturbance.  Respiratory: Negative for cough, chest tightness, shortness of breath and wheezing.   Cardiovascular: Negative for chest pain and palpitations.  Gastrointestinal: Negative for abdominal distention, abdominal pain and constipation.  Endocrine: Negative for polydipsia.  Genitourinary: Negative for dysuria and frequency.  Musculoskeletal:  Knee pain  Skin: Negative for rash.  Neurological: Negative for tremors, light-headedness and numbness.  Hematological: Does not bruise/bleed easily.  Psychiatric/Behavioral: Negative for agitation and behavioral problems.    Objective:  BP 132/82   Pulse 78   Temp 97.7 F (36.5 C) (Oral)   Ht 5' 3"  (1.6 m)   Wt 216 lb 12.8 oz (98.3 kg)   LMP 06/20/2015 Comment: no chance pregnant,husband has had vasectomy pt  sates  SpO2 100%   BMI 38.40 kg/m   BP/Weight 04/13/2018 11/30/1189 06/29/8293  Systolic BP 621 308 657  Diastolic BP 82 90 70  Wt. (Lbs) 216.8 218.6 221  BMI 38.4 38.72 39.15      Physical Exam Constitutional:      Appearance: She is well-developed.  Cardiovascular:     Rate and Rhythm: Normal rate.     Heart sounds: Normal heart sounds. No murmur.  Pulmonary:     Effort: Pulmonary effort is normal.     Breath sounds: Normal breath sounds. No wheezing or rales.  Chest:     Chest wall: No tenderness.  Abdominal:     General: Bowel sounds are normal. There is no distension.     Palpations: Abdomen is soft. There is no mass.     Tenderness: There is no abdominal tenderness.  Musculoskeletal: Normal range of motion.     Comments: Normal appearance of left knee, no tenderness to palpation, no crepitus  Neurological:     Mental Status: She is alert and oriented to person, place, and time.  Psychiatric:        Mood and Affect: Mood normal.        Behavior: Behavior normal.      CMP Latest Ref Rng & Units 09/29/2017 11/27/2016 01/19/2016  Glucose 65 - 99 mg/dL 145(H) 301(H) 113(H)  BUN 6 - 24 mg/dL 13 12 14   Creatinine 0.57 - 1.00 mg/dL 0.80 0.72 0.88  Sodium 134 - 144 mmol/L 142 135 140  Potassium 3.5 - 5.2 mmol/L 4.2 4.6 4.4  Chloride 96 - 106 mmol/L 101 96 107  CO2 20 - 29 mmol/L 22 25 25   Calcium 8.7 - 10.2 mg/dL 9.9 9.3 9.4  Total Protein 6.0 - 8.5 g/dL 7.4 7.7 7.0  Total Bilirubin 0.0 - 1.2 mg/dL <0.2 0.3 0.5  Alkaline Phos 39 - 117 IU/L 132(H) 146(H) 88  AST 0 - 40 IU/L 24 34 18  ALT 0 - 32 IU/L 23 34(H) 13    Lipid Panel     Component Value Date/Time   CHOL 143 04/04/2017 1020   TRIG 133 04/04/2017 1020   HDL 53 04/04/2017 1020   CHOLHDL 2.7 04/04/2017 1020   CHOLHDL 3.2 01/19/2016 1043   VLDL 31 (H) 01/19/2016 1043   LDLCALC 63 04/04/2017 1020    Lab Results  Component Value Date   HGBA1C 7.6 (A) 12/30/2017     Assessment & Plan:   1. Diabetes  mellitus due to underlying condition with hyperosmolarity without coma, without long-term current use of insulin (HCC) Controlled with A1c of 7.6 - POCT glucose (manual entry) - Hemoglobin A1c  2. Chronic pain of left knee Uncontrolled Advised that weight loss would be beneficial Use knee brace, apply ice Refilled naproxen which she has been out of - naproxen sodium (ANAPROX) 550 MG tablet; Take 1 tablet (550 mg total) by mouth 2 (two) times daily with a meal.  Dispense: 60 tablet; Refill: 1  3. Hypertension, unspecified type Controlled Low-sodium, DASH diet  4. Pure hypercholesterolemia Controlled Low-cholesterol diet  5. Other diabetic neurological complication associated with type 2 diabetes mellitus (HCC) Stable - CMP14+EGFR - Lipid panel - Microalbumin/Creatinine Ratio, Urine   Meds ordered this encounter  Medications  . naproxen sodium (ANAPROX) 550 MG tablet    Sig: Take 1 tablet (550 mg total) by mouth 2 (two) times daily with a meal.    Dispense:  60 tablet    Refill:  1    Follow-up: Return in about 3 months (around 07/13/2018) for follow up of chronic medical conditions.   Charlott Rakes MD

## 2018-04-14 LAB — LIPID PANEL
Chol/HDL Ratio: 3.7 ratio (ref 0.0–4.4)
Cholesterol, Total: 188 mg/dL (ref 100–199)
HDL: 51 mg/dL (ref >39)
LDL Calculated: 108 mg/dL — ABNORMAL HIGH (ref 0–99)
Triglycerides: 143 mg/dL (ref 0–149)
VLDL Cholesterol Cal: 29 mg/dL (ref 5–40)

## 2018-04-14 LAB — CMP14+EGFR
A/G RATIO: 1.6 (ref 1.2–2.2)
ALT: 23 IU/L (ref 0–32)
AST: 19 IU/L (ref 0–40)
Albumin: 4.3 g/dL (ref 3.8–4.9)
Alkaline Phosphatase: 124 IU/L — ABNORMAL HIGH (ref 39–117)
BILIRUBIN TOTAL: 0.3 mg/dL (ref 0.0–1.2)
BUN/Creatinine Ratio: 16 (ref 9–23)
BUN: 12 mg/dL (ref 6–24)
CHLORIDE: 100 mmol/L (ref 96–106)
CO2: 23 mmol/L (ref 20–29)
Calcium: 9.7 mg/dL (ref 8.7–10.2)
Creatinine, Ser: 0.76 mg/dL (ref 0.57–1.00)
GFR calc Af Amer: 99 mL/min/{1.73_m2} (ref >59)
GFR calc non Af Amer: 86 mL/min/{1.73_m2} (ref >59)
Globulin, Total: 2.7 g/dL (ref 1.5–4.5)
Glucose: 162 mg/dL — ABNORMAL HIGH (ref 65–99)
Potassium: 4.4 mmol/L (ref 3.5–5.2)
Sodium: 137 mmol/L (ref 134–144)
Total Protein: 7 g/dL (ref 6.0–8.5)

## 2018-04-14 LAB — HEMOGLOBIN A1C
Est. average glucose Bld gHb Est-mCnc: 189 mg/dL
HEMOGLOBIN A1C: 8.2 % — AB (ref 4.8–5.6)

## 2018-04-27 ENCOUNTER — Telehealth: Payer: Self-pay

## 2018-04-27 NOTE — Telephone Encounter (Signed)
Patient was called and informed of lab results via interpreter([id number])

## 2018-04-27 NOTE — Telephone Encounter (Signed)
-----   Message from Charlott Rakes, MD sent at 04/14/2018  6:16 PM EST ----- A1c is 8.2 which has trended up from 7.6.Advise to work on Diabetic diet and comply with medications as we will be considering insulin if level continues to rise. Cholesterol is normal

## 2018-04-30 ENCOUNTER — Other Ambulatory Visit: Payer: Self-pay | Admitting: Family Medicine

## 2018-04-30 DIAGNOSIS — K219 Gastro-esophageal reflux disease without esophagitis: Secondary | ICD-10-CM

## 2018-05-25 ENCOUNTER — Telehealth: Payer: Self-pay | Admitting: Family Medicine

## 2018-05-25 NOTE — Telephone Encounter (Signed)
Pt came in to drop off paper work to be filled out by provider please follow up once completed

## 2018-05-28 NOTE — Telephone Encounter (Signed)
Paperwork has been received and patient will be contacted once paperwork is done.

## 2018-07-02 ENCOUNTER — Other Ambulatory Visit: Payer: Self-pay | Admitting: Family Medicine

## 2018-07-02 DIAGNOSIS — E0800 Diabetes mellitus due to underlying condition with hyperosmolarity without nonketotic hyperglycemic-hyperosmolar coma (NKHHC): Secondary | ICD-10-CM

## 2018-07-14 ENCOUNTER — Ambulatory Visit: Payer: Self-pay | Admitting: Family Medicine

## 2018-07-28 ENCOUNTER — Ambulatory Visit: Payer: Self-pay | Attending: Family Medicine | Admitting: Family Medicine

## 2018-07-28 ENCOUNTER — Other Ambulatory Visit: Payer: Self-pay

## 2018-07-28 ENCOUNTER — Encounter: Payer: Self-pay | Admitting: Family Medicine

## 2018-07-28 DIAGNOSIS — I10 Essential (primary) hypertension: Secondary | ICD-10-CM

## 2018-07-28 DIAGNOSIS — E7800 Pure hypercholesterolemia, unspecified: Secondary | ICD-10-CM

## 2018-07-28 DIAGNOSIS — E0800 Diabetes mellitus due to underlying condition with hyperosmolarity without nonketotic hyperglycemic-hyperosmolar coma (NKHHC): Secondary | ICD-10-CM

## 2018-07-28 DIAGNOSIS — E1149 Type 2 diabetes mellitus with other diabetic neurological complication: Secondary | ICD-10-CM

## 2018-07-28 MED ORDER — GABAPENTIN 300 MG PO CAPS: 600.0000 mg | ORAL_CAPSULE | Freq: Two times a day (BID) | ORAL | 1 refills | Status: DC

## 2018-07-28 MED ORDER — GLIPIZIDE 10 MG PO TABS: 10.0000 mg | ORAL_TABLET | Freq: Two times a day (BID) | ORAL | 1 refills | Status: DC

## 2018-07-28 MED ORDER — LISINOPRIL-HYDROCHLOROTHIAZIDE 10-12.5 MG PO TABS: 1.0000 | ORAL_TABLET | Freq: Every day | ORAL | 1 refills | Status: DC

## 2018-07-28 MED ORDER — ATORVASTATIN CALCIUM 40 MG PO TABS: 40.0000 mg | ORAL_TABLET | Freq: Every day | ORAL | 1 refills | Status: DC

## 2018-07-28 MED ORDER — METFORMIN HCL ER 500 MG PO TB24: 1000.0000 mg | ORAL_TABLET | Freq: Two times a day (BID) | ORAL | 1 refills | Status: DC

## 2018-07-28 NOTE — Progress Notes (Signed)
Patient has been called and DOB has been verified. Patient has been screened and transferred to PCP to start phone visit.     

## 2018-07-28 NOTE — Progress Notes (Signed)
Virtual Visit via Telephone Note  I connected with Carmen Burns, on 07/28/2018 at 9:49 AM by telephone and verified that I am speaking with the correct person using two identifiers.   Consent: I discussed the limitations, risks, security and privacy concerns of performing an evaluation and management service by telephone and the availability of in person appointments. I also discussed with the patient that there may be a patient responsible charge related to this service. The patient expressed understanding and agreed to proceed.   Location of Patient: Home  Location of Provider: Clinic   Persons participating in Telemedicine visit: Kimra Kantor ID# [id number] Ozzie Hoyle Dr. Felecia Shelling     History of Present Illness: Carmen Burns is a 58 year old female with a history of type 2 diabetes mellitus (A1c 8.2 from 03/2018), diabetic neuropathy hypertension, hyperlipidemia, GERD who presents today for follow-up visit. Her fasting blood sugar was 113 today and she states fasting sugars and other days have been normal with blood sugars all under 200.  Her neuropathy is controlled on gabapentin and she denies hypoglycemia. Compliant with her antihypertensive and denies side effects from her medications. Doing well on her statins with no complaints of myalgias. She has no additional concerns at this time.  Past Medical History:  Diagnosis Date  . Anemia 2013  . Diabetes mellitus without complication ([place])   . Hyperlipidemia   . Hypertension   . Refusal of blood transfusions as patient is Jehovah's Witness    Allergies  Allergen Reactions  . Penicillins Itching and Rash    Current Outpatient Medications on File Prior to Visit  Medication Sig Dispense Refill  . atorvastatin (LIPITOR) 20 MG tablet Take 1 tablet (20 mg total) by mouth daily. 30 tablet 5  . Blood Glucose Monitoring Suppl (TRUE METRIX METER) W/DEVICE KIT USE TO CHECK BLOOD SUGAR THREE TIMES DAILY  AND AT BEDTIME 1 kit 0  . cetirizine (ZYRTEC) 10 MG tablet Take 1 tablet (10 mg total) by mouth daily. 30 tablet 3  . gabapentin (NEURONTIN) 300 MG capsule Take 2 capsules (600 mg total) by mouth 2 (two) times daily. 120 capsule 5  . glipiZIDE (GLUCOTROL) 10 MG tablet TAKE 1 TABLET (10 MG TOTAL) BY MOUTH 2 (TWO) TIMES DAILY BEFORE A MEAL. 60 tablet 2  . glucose blood test strip USE TO CHECK BLOOD SUGAR THREE TIMES DAILY AND AT BEDTIME 100 each 12  . lisinopril-hydrochlorothiazide (PRINZIDE,ZESTORETIC) 10-12.5 MG tablet Take 1 tablet by mouth daily. 30 tablet 5  . metFORMIN (GLUCOPHAGE-XR) 500 MG 24 hr tablet Take 2 tablets (1,000 mg total) by mouth 2 (two) times daily. 2 tabs daily 120 tablet 5  . naproxen sodium (ANAPROX) 550 MG tablet Take 1 tablet (550 mg total) by mouth 2 (two) times daily with a meal. 60 tablet 1  . pantoprazole (PROTONIX) 40 MG tablet TAKE 1 TABLET (40 MG TOTAL) BY MOUTH DAILY. 30 tablet 2  . TRUEPLUS LANCETS 28G MISC USE TO CHECK BLOOD SUGAR THREE TIMES DAILY AND AT BEDTIME 100 each 2  . sitaGLIPtin (JANUVIA) 100 MG tablet Take 1 tablet (100 mg total) by mouth daily. (Patient not taking: Reported on 07/28/2018) 30 tablet 5   No current facility-administered medications on file prior to visit.     Observations/Objective: Awake,alert, oriented x3 Not in acute distress   CMP Latest Ref Rng & Units 04/13/2018 09/29/2017 11/27/2016  Glucose 65 - 99 mg/dL 162(H) 145(H) 301(H)  BUN 6 - 24 mg/dL 12 13 12   Creatinine 0.57 -  1.00 mg/dL 0.76 0.80 0.72  Sodium 134 - 144 mmol/L 137 142 135  Potassium 3.5 - 5.2 mmol/L 4.4 4.2 4.6  Chloride 96 - 106 mmol/L 100 101 96  CO2 20 - 29 mmol/L 23 22 25   Calcium 8.7 - 10.2 mg/dL 9.7 9.9 9.3  Total Protein 6.0 - 8.5 g/dL 7.0 7.4 7.7  Total Bilirubin 0.0 - 1.2 mg/dL 0.3 <0.2 0.3  Alkaline Phos 39 - 117 IU/L 124(H) 132(H) 146(H)  AST 0 - 40 IU/L 19 24 34  ALT 0 - 32 IU/L 23 23 34(H)    Lipid Panel     Component Value Date/Time   CHOL  188 04/13/2018 1238   TRIG 143 04/13/2018 1238   HDL 51 04/13/2018 1238   CHOLHDL 3.7 04/13/2018 1238   CHOLHDL 3.2 01/19/2016 1043   VLDL 31 (H) 01/19/2016 1043   LDLCALC 108 (H) 04/13/2018 1238    Lab Results  Component Value Date   HGBA1C 8.2 (H) 04/13/2018    The 10-year ASCVD risk score Mikey Bussing DC Jr., et al., 2013) is: 8.1%   Values used to calculate the score:     Age: 76 years     Sex: Female     Is Non-Hispanic African American: No     Diabetic: Yes     Tobacco smoker: No     Systolic Blood Pressure: 993 mmHg     Is BP treated: Yes     HDL Cholesterol: 51 mg/dL     Total Cholesterol: 188 mg/dL  Assessment and Plan: 1. Pure hypercholesterolemia LDL of 108 with goal of less than 100 ASCVD risk of 8.1% Increase atorvastatin from 20 mg to 40 mg - atorvastatin (LIPITOR) 40 MG tablet; Take 1 tablet (40 mg total) by mouth daily.  Dispense: 90 tablet; Refill: 1  2. Other diabetic neurological complication associated with type 2 diabetes mellitus (HCC) Controlled - gabapentin (NEURONTIN) 300 MG capsule; Take 2 capsules (600 mg total) by mouth 2 (two) times daily.  Dispense: 360 capsule; Refill: 1  3. Diabetes mellitus due to underlying condition with hyperosmolarity without coma, without long-term current use of insulin (HCC) Not at goal with A1c of 8.2 from 03/2018 Fasting blood sugars have been controlled with 113 this morning She never obtained Januvia which was previously prescribed due to problems with paperwork for medication assistance program We will review and reassess at next person visit with an A1c and adjust regimen accordingly - glipiZIDE (GLUCOTROL) 10 MG tablet; Take 1 tablet (10 mg total) by mouth 2 (two) times daily before a meal.  Dispense: 180 tablet; Refill: 1 - metFORMIN (GLUCOPHAGE-XR) 500 MG 24 hr tablet; Take 2 tablets (1,000 mg total) by mouth 2 (two) times daily. 2 tabs daily  Dispense: 360 tablet; Refill: 1  4. Essential hypertension Blood  pressure was previously controlled at last office visit No regimen change today Counseled on blood pressure goal of less than 130/80, low-sodium, DASH diet, medication compliance, 150 minutes of moderate intensity exercise per week. Discussed medication compliance, adverse effects. - lisinopril-hydrochlorothiazide (ZESTORETIC) 10-12.5 MG tablet; Take 1 tablet by mouth daily.  Dispense: 90 tablet; Refill: 1   Follow Up Instructions: Return in about 3 months (around 10/28/2018).     I discussed the assessment and treatment plan with the patient. The patient was provided an opportunity to ask questions and all were answered. The patient agreed with the plan and demonstrated an understanding of the instructions.   The patient was advised to call back  or seek an in-person evaluation if the symptoms worsen or if the condition fails to improve as anticipated.     I provided 15 minutes total of non-face-to-face time during this encounter including median intraservice time, reviewing previous notes, labs, imaging, medications and explaining diagnosis and management.     Charlott Rakes, MD, FAAFP. [facility] [place], [place]   07/28/2018, 9:49 AMle

## 2018-09-02 ENCOUNTER — Other Ambulatory Visit: Payer: Self-pay | Admitting: Family Medicine

## 2018-09-02 DIAGNOSIS — K219 Gastro-esophageal reflux disease without esophagitis: Secondary | ICD-10-CM

## 2018-09-29 ENCOUNTER — Telehealth: Payer: Self-pay | Admitting: Family Medicine

## 2018-09-29 NOTE — Telephone Encounter (Signed)
Pt called stating she has not been able to get her  -sitaGLIPtin (JANUVIA) 100 MG tablet  Since 07/28/2018. Please follow up, she states some paperwork was filled out incorrectly which her pharmacy at [facility] needs. Please follow up

## 2018-09-29 NOTE — Telephone Encounter (Signed)
Patient was called and informed that she can obtain samples from our pharmacy and re enroll in [facility] once she arrives.

## 2018-10-01 ENCOUNTER — Other Ambulatory Visit (HOSPITAL_COMMUNITY): Payer: Self-pay | Admitting: *Deleted

## 2018-10-01 DIAGNOSIS — N644 Mastodynia: Secondary | ICD-10-CM

## 2018-10-05 ENCOUNTER — Other Ambulatory Visit: Payer: Self-pay | Admitting: Family Medicine

## 2018-10-05 DIAGNOSIS — K219 Gastro-esophageal reflux disease without esophagitis: Secondary | ICD-10-CM

## 2018-12-29 ENCOUNTER — Encounter (HOSPITAL_COMMUNITY): Payer: Self-pay

## 2018-12-29 ENCOUNTER — Other Ambulatory Visit: Payer: Self-pay

## 2018-12-29 ENCOUNTER — Ambulatory Visit (HOSPITAL_COMMUNITY)
Admission: RE | Admit: 2018-12-29 | Discharge: 2018-12-29 | Disposition: A | Discharge status: Home / Self Care | Payer: Self-pay | Source: Ambulatory Visit | Attending: Obstetrics and Gynecology | Admitting: Obstetrics and Gynecology

## 2018-12-29 ENCOUNTER — Ambulatory Visit: Payer: Self-pay

## 2018-12-29 ENCOUNTER — Ambulatory Visit
Admission: RE | Admit: 2018-12-29 | Discharge: 2018-12-29 | Disposition: A | Discharge status: Home / Self Care | Payer: No Typology Code available for payment source | Source: Ambulatory Visit | Attending: Obstetrics and Gynecology | Admitting: Obstetrics and Gynecology

## 2018-12-29 DIAGNOSIS — N644 Mastodynia: Secondary | ICD-10-CM | POA: Insufficient documentation

## 2018-12-29 DIAGNOSIS — Z1239 Encounter for other screening for malignant neoplasm of breast: Secondary | ICD-10-CM | POA: Insufficient documentation

## 2018-12-29 NOTE — Progress Notes (Signed)
Complaints of left outer breast pain x 2 months that comes and goes. Patient rates the pain at a 7 out of 10.  Pap Smear: Pap smear not completed today. Last Pap smear was 04/25/2017 at [facility] and normal with negative HPV. Per patient has no history of an abnormal Pap smear. Last Pap smear result is in Epic.  Physical exam: Breasts Breasts symmetrical. No skin abnormalities bilateral breasts. No nipple retraction bilateral breasts. No nipple discharge bilateral breasts. No lymphadenopathy. No lumps palpated bilateral breasts. Complaints of left outer breast tenderness on exam. Referred patient to the [facility] for a diagnostic mammogram. Appointment scheduled for Tuesday, December 29, 2018 at 1050.        Pelvic/Bimanual No Pap smear completed today since last Pap smear and HPV typing was 04/25/2017. Pap smear not indicated per BCCCP guidelines.   Smoking History: Patient has never smoked.  Patient Navigation: Patient education provided. Access to services provided for patient through [facility] program. Spanish interpreter provided.   Colorectal Cancer Screening: Per patient has never had a colonoscopy completed. No complaints today.   Breast and Cervical Cancer Risk Assessment: Patient has no family history of breast cancer, known genetic mutations, or radiation treatment to the chest before age 24. Patient has no history of cervical dysplasia, immunocompromised, or DES exposure in-utero.  Risk Assessment    Risk Scores      12/29/2018   Last edited by: Loletta Parish, RN   5-year risk: 0.7 %   Lifetime risk: 3.8 %         Used Spanish interpreter Omer Jack from [facility].

## 2018-12-29 NOTE — Patient Instructions (Signed)
Explained breast self awareness with Carmen Burns. Patient did not need a Pap smear today due to last Pap smear and HPV typing was 04/25/2017. Let her know BCCCP will cover Pap smears and HPV typing every 5 years unless has a history of abnormal Pap smears. Referred patient to the [facility] for a diagnostic mammogram. Appointment scheduled for Tuesday, December 29, 2018 at 1050. Patient aware of appointment and will be there. Carmen Burns verbalized understanding.  Carmen Burns, Carmen [place], RN 12:24 PM

## 2018-12-30 ENCOUNTER — Ambulatory Visit: Payer: Self-pay | Admitting: Family Medicine

## 2019-01-13 ENCOUNTER — Other Ambulatory Visit: Payer: Self-pay | Admitting: Family Medicine

## 2019-01-13 DIAGNOSIS — K219 Gastro-esophageal reflux disease without esophagitis: Secondary | ICD-10-CM

## 2019-01-14 IMAGING — MG DIGITAL SCREENING BILATERAL MAMMOGRAM WITH CAD
4 series · 4 of 4 positions shown · non-contrast
Comparison: Previous exam(s).

CLINICAL DATA: Screening.

EXAM:
DIGITAL SCREENING BILATERAL MAMMOGRAM WITH CAD

[L MLO]
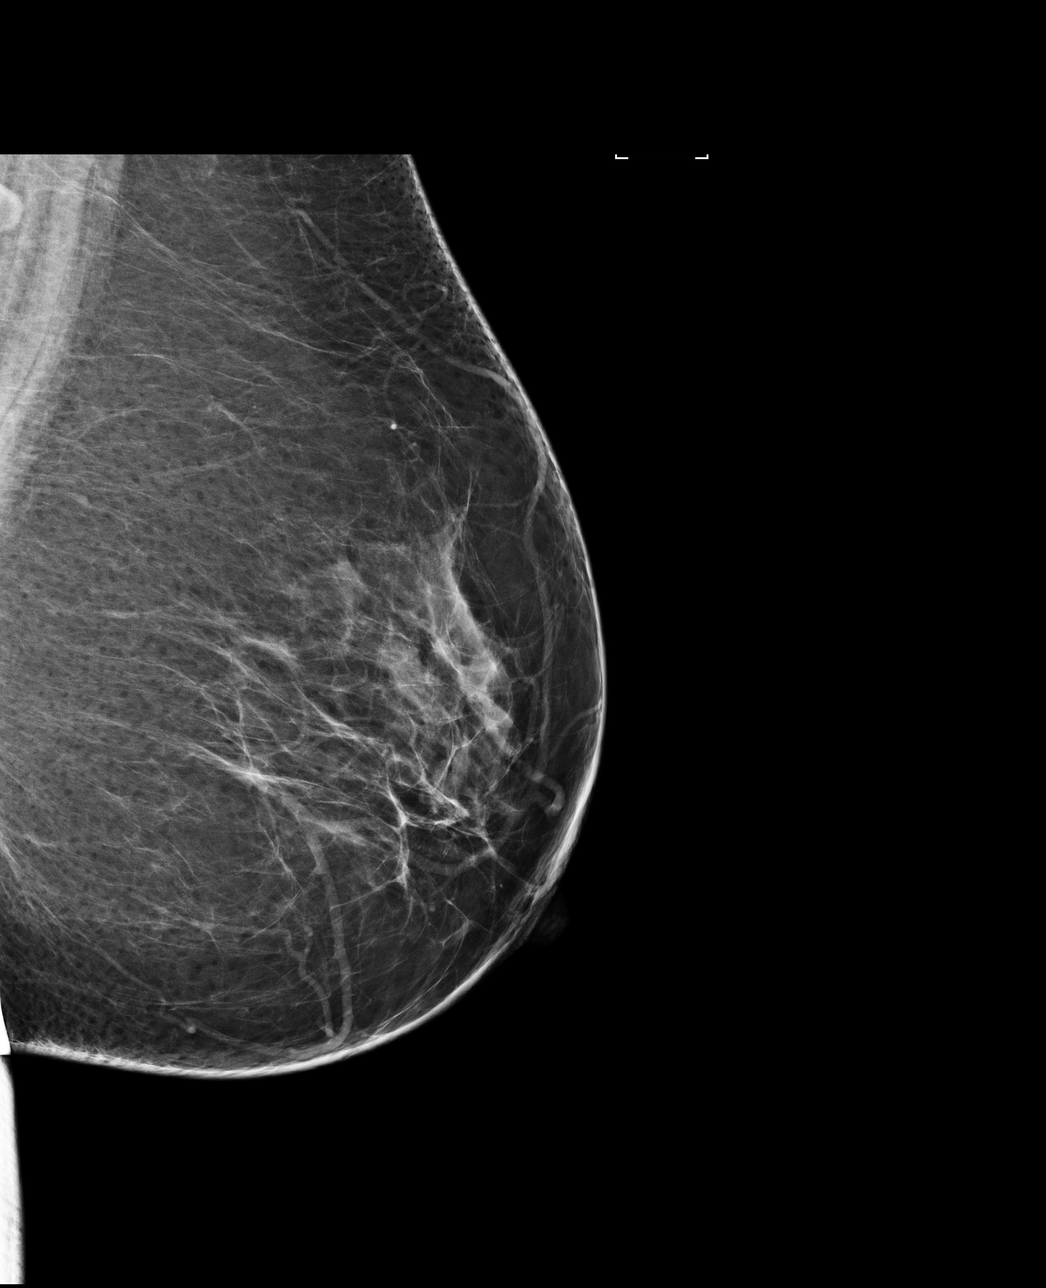

[R MLO]
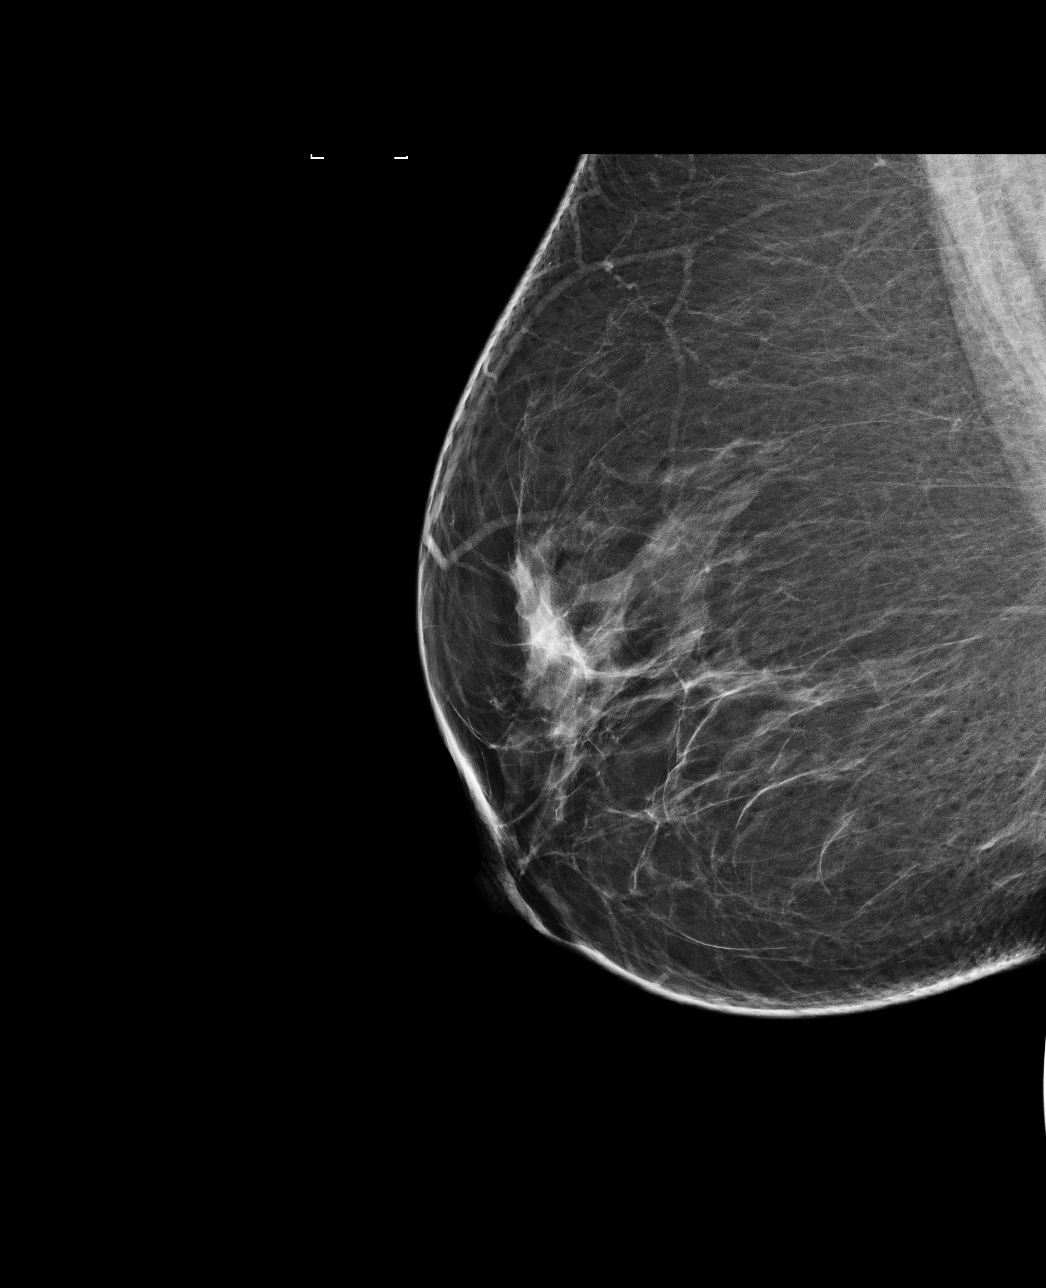

[L CC]
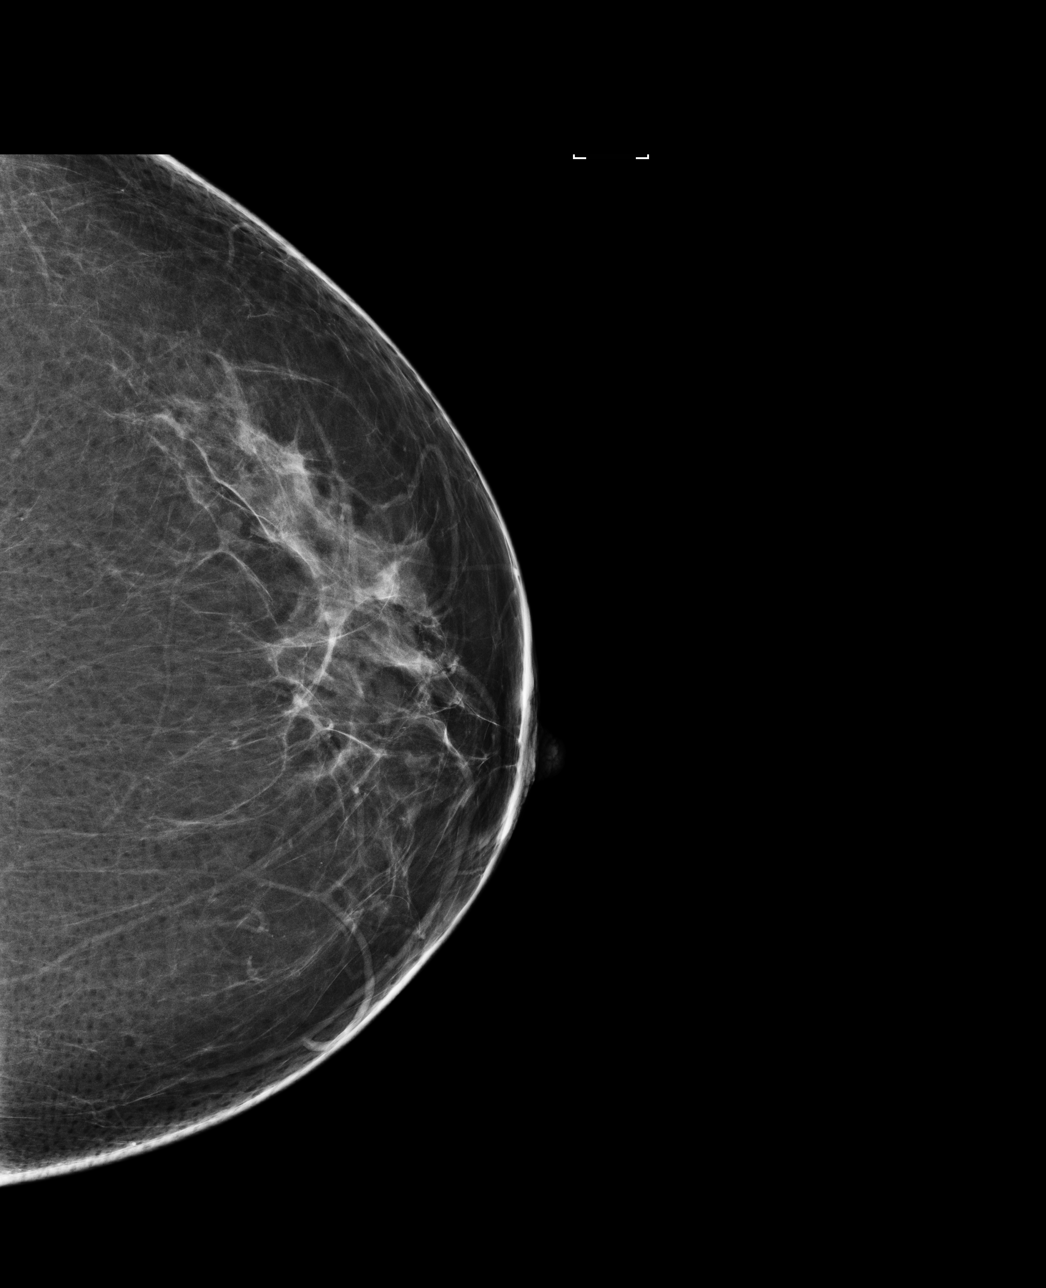

[R CC]
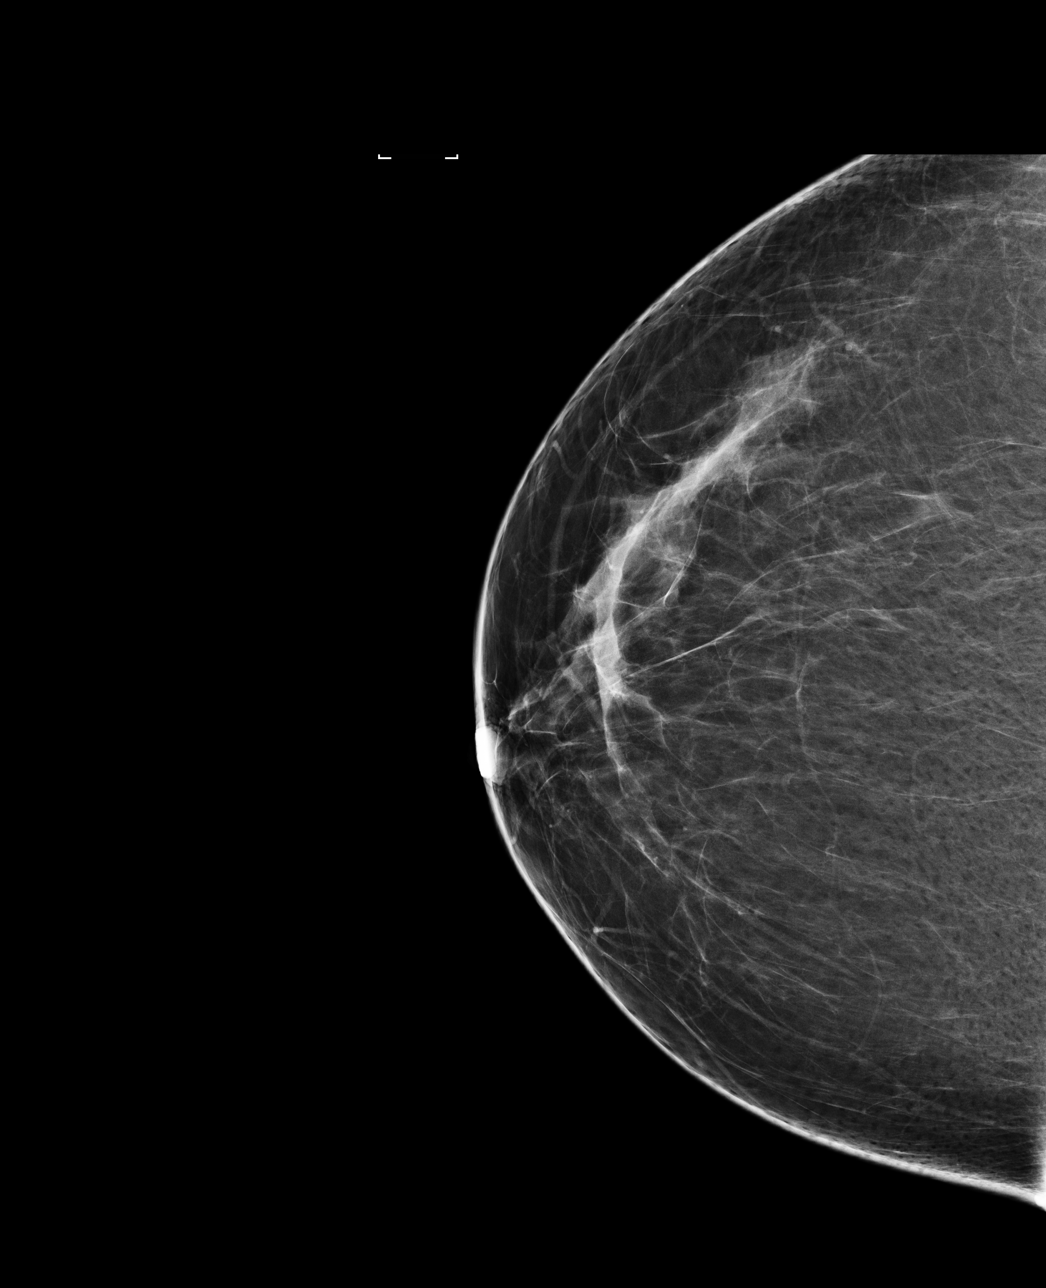

[4 of 4 positions shown; findings below may reference images not displayed]

ACR Breast Density Category b: There are scattered areas of
fibroglandular density.
FINDINGS: There are no findings suspicious for malignancy. Images were
processed with CAD.
IMPRESSION: No mammographic evidence of malignancy. A result letter of this
screening mammogram will be mailed directly to the patient.

RECOMMENDATION:
Screening mammogram in one year. (Code:AS-G-LCT)

BI-RADS CATEGORY  1: Negative.

## 2019-01-18 ENCOUNTER — Encounter: Payer: Self-pay | Admitting: Family Medicine

## 2019-01-18 ENCOUNTER — Ambulatory Visit: Payer: Self-pay | Attending: Family Medicine | Admitting: Family Medicine

## 2019-01-18 ENCOUNTER — Other Ambulatory Visit: Payer: Self-pay

## 2019-01-18 DIAGNOSIS — E0800 Diabetes mellitus due to underlying condition with hyperosmolarity without nonketotic hyperglycemic-hyperosmolar coma (NKHHC): Secondary | ICD-10-CM

## 2019-01-18 DIAGNOSIS — I10 Essential (primary) hypertension: Secondary | ICD-10-CM

## 2019-01-18 DIAGNOSIS — E1149 Type 2 diabetes mellitus with other diabetic neurological complication: Secondary | ICD-10-CM

## 2019-01-18 DIAGNOSIS — E7800 Pure hypercholesterolemia, unspecified: Secondary | ICD-10-CM

## 2019-01-18 MED ORDER — SITAGLIPTIN PHOSPHATE 100 MG PO TABS: 100.0000 mg | ORAL_TABLET | Freq: Every day | ORAL | 1 refills | Status: DC

## 2019-01-18 MED ORDER — ATORVASTATIN CALCIUM 40 MG PO TABS: 40.0000 mg | ORAL_TABLET | Freq: Every day | ORAL | 1 refills | Status: DC

## 2019-01-18 MED ORDER — GLIPIZIDE 10 MG PO TABS: 10.0000 mg | ORAL_TABLET | Freq: Two times a day (BID) | ORAL | 1 refills | Status: DC

## 2019-01-18 MED ORDER — GABAPENTIN 300 MG PO CAPS: 600.0000 mg | ORAL_CAPSULE | Freq: Two times a day (BID) | ORAL | 1 refills | Status: DC

## 2019-01-18 MED ORDER — METFORMIN HCL ER 500 MG PO TB24: 1000.0000 mg | ORAL_TABLET | Freq: Two times a day (BID) | ORAL | 1 refills | Status: DC

## 2019-01-18 MED ORDER — LISINOPRIL-HYDROCHLOROTHIAZIDE 10-12.5 MG PO TABS: 1.0000 | ORAL_TABLET | Freq: Every day | ORAL | 1 refills | Status: DC

## 2019-01-18 NOTE — Progress Notes (Signed)
Virtual Visit via Telephone Note  I connected with Carmen Burns, on 01/18/2019 at 11:56 AM by telephone due to the COVID-19 pandemic and verified that I am speaking with the correct person using two identifiers.   Consent: I discussed the limitations, risks, security and privacy concerns of performing an evaluation and management service by telephone and the availability of in person appointments. I also discussed with the patient that there may be a patient responsible charge related to this service. The patient expressed understanding and agreed to proceed.   Location of Patient: Home  Location of Provider: Clinic   Persons participating in Telemedicine visit: Shanielle Correll [place]-CMA Dr. Felecia Shelling Interpreter    History of Present Illness: Carmen Burns is a 58 year old female with a history of type 2 diabetes mellitus (A1c 8.2 from 03/2018), diabetic neuropathy hypertension, hyperlipidemia, GERD who presents today for follow-up visit.   Her blood sugars have been in the 116 range. She has eating right and exercising. Denies visual concerns, hypoglycemia, neuropathy is controlled on Gabapentin. Compliant with her antihypertensive and statin and denies adverse effects from her medications. Reflux symptoms have been controlled. She has no additional concerns today.  Past Medical History:  Diagnosis Date  . Anemia 2013  . Diabetes mellitus without complication ([place])   . Hyperlipidemia   . Hypertension   . Refusal of blood transfusions as patient is Jehovah's Witness    Allergies  Allergen Reactions  . Penicillins Itching and Rash    Current Outpatient Medications on File Prior to Visit  Medication Sig Dispense Refill  . atorvastatin (LIPITOR) 40 MG tablet Take 1 tablet (40 mg total) by mouth daily. 90 tablet 1  . Blood Glucose Monitoring Suppl (TRUE METRIX METER) W/DEVICE KIT USE TO CHECK BLOOD SUGAR THREE TIMES DAILY AND AT BEDTIME 1 kit 0  . cetirizine  (ZYRTEC) 10 MG tablet Take 1 tablet (10 mg total) by mouth daily. 30 tablet 3  . gabapentin (NEURONTIN) 300 MG capsule Take 2 capsules (600 mg total) by mouth 2 (two) times daily. 360 capsule 1  . glipiZIDE (GLUCOTROL) 10 MG tablet Take 1 tablet (10 mg total) by mouth 2 (two) times daily before a meal. 180 tablet 1  . glucose blood test strip USE TO CHECK BLOOD SUGAR THREE TIMES DAILY AND AT BEDTIME 100 each 12  . JANUVIA 100 MG tablet TAKE 1 TABLET (100 MG TOTAL) BY MOUTH DAILY. 30 tablet 0  . lisinopril-hydrochlorothiazide (ZESTORETIC) 10-12.5 MG tablet Take 1 tablet by mouth daily. 90 tablet 1  . metFORMIN (GLUCOPHAGE-XR) 500 MG 24 hr tablet Take 2 tablets (1,000 mg total) by mouth 2 (two) times daily. 2 tabs daily 360 tablet 1  . naproxen sodium (ANAPROX) 550 MG tablet Take 1 tablet (550 mg total) by mouth 2 (two) times daily with a meal. 60 tablet 1  . pantoprazole (PROTONIX) 40 MG tablet TAKE 1 TABLET (40 MG TOTAL) BY MOUTH DAILY. 30 tablet 0  . TRUEPLUS LANCETS 28G MISC USE TO CHECK BLOOD SUGAR THREE TIMES DAILY AND AT BEDTIME 100 each 2   No current facility-administered medications on file prior to visit.     Observations/Objective: Awake, alert, oriented x3 Not in acute distress  CMP Latest Ref Rng & Units 04/13/2018 09/29/2017 11/27/2016  Glucose 65 - 99 mg/dL 162(H) 145(H) 301(H)  BUN 6 - 24 mg/dL 12 13 12   Creatinine 0.57 - 1.00 mg/dL 0.76 0.80 0.72  Sodium 134 - 144 mmol/L 137 142 135  Potassium 3.5 - 5.2  mmol/L 4.4 4.2 4.6  Chloride 96 - 106 mmol/L 100 101 96  CO2 20 - 29 mmol/L 23 22 25   Calcium 8.7 - 10.2 mg/dL 9.7 9.9 9.3  Total Protein 6.0 - 8.5 g/dL 7.0 7.4 7.7  Total Bilirubin 0.0 - 1.2 mg/dL 0.3 <0.2 0.3  Alkaline Phos 39 - 117 IU/L 124(H) 132(H) 146(H)  AST 0 - 40 IU/L 19 24 34  ALT 0 - 32 IU/L 23 23 34(H)    Lipid Panel     Component Value Date/Time   CHOL 188 04/13/2018 1238   TRIG 143 04/13/2018 1238   HDL 51 04/13/2018 1238   CHOLHDL 3.7 04/13/2018 1238    CHOLHDL 3.2 01/19/2016 1043   VLDL 31 (H) 01/19/2016 1043   LDLCALC 108 (H) 04/13/2018 1238   LABVLDL 29 04/13/2018 1238    Lab Results  Component Value Date   HGBA1C 8.2 (H) 04/13/2018     Assessment and Plan: 1. Pure hypercholesterolemia LDL slightly elevated above goal of less than 100 We will check lipid panel Low-cholesterol diet - atorvastatin (LIPITOR) 40 MG tablet; Take 1 tablet (40 mg total) by mouth daily.  Dispense: 90 tablet; Refill: 1  2. Other diabetic neurological complication associated with type 2 diabetes mellitus (HCC) Controlled on gabapentin - gabapentin (NEURONTIN) 300 MG capsule; Take 2 capsules (600 mg total) by mouth 2 (two) times daily.  Dispense: 360 capsule; Refill: 1  3. Diabetes mellitus due to underlying condition with hyperosmolarity without coma, without long-term current use of insulin (HCC) Uncontrolled with A1c of 8.2; goal is less than 7 Blood sugar logs reveal improvement We will send of A1c and adjust regimen accordingly - Hemoglobin A1c; Future - Basic Metabolic Panel; Future - glipiZIDE (GLUCOTROL) 10 MG tablet; Take 1 tablet (10 mg total) by mouth 2 (two) times daily before a meal.  Dispense: 180 tablet; Refill: 1 - metFORMIN (GLUCOPHAGE-XR) 500 MG 24 hr tablet; Take 2 tablets (1,000 mg total) by mouth 2 (two) times daily. 2 tabs daily  Dispense: 360 tablet; Refill: 1 - Lipid panel; Future  4. Essential hypertension Stable Counseled on blood pressure goal of less than 130/80, low-sodium, DASH diet, medication compliance, 150 minutes of moderate intensity exercise per week. Discussed medication compliance, adverse effects. - lisinopril-hydrochlorothiazide (ZESTORETIC) 10-12.5 MG tablet; Take 1 tablet by mouth daily.  Dispense: 90 tablet; Refill: 1   Follow Up Instructions: 3 months chronic condition   I discussed the assessment and treatment plan with the patient. The patient was provided an opportunity to ask questions and all  were answered. The patient agreed with the plan and demonstrated an understanding of the instructions.   The patient was advised to call back or seek an in-person evaluation if the symptoms worsen or if the condition fails to improve as anticipated.     I provided 15 minutes total of non-face-to-face time during this encounter including median intraservice time, reviewing previous notes, labs, imaging, medications, management and patient verbalized understanding.     Charlott Rakes, MD, FAAFP. [facility] and [place], [place]   01/18/2019, 11:56 AM

## 2019-01-18 NOTE — Progress Notes (Signed)
Patient verified DOB Patient has eaten today. Patient has taken medication today. Patient denies pain at this time. CBG: 116 Before eating

## 2019-01-22 ENCOUNTER — Ambulatory Visit: Payer: No Typology Code available for payment source

## 2019-01-22 ENCOUNTER — Ambulatory Visit: Payer: Self-pay | Attending: Family Medicine | Admitting: Pharmacist

## 2019-01-22 ENCOUNTER — Other Ambulatory Visit: Payer: Self-pay

## 2019-01-22 DIAGNOSIS — Z23 Encounter for immunization: Secondary | ICD-10-CM

## 2019-01-22 DIAGNOSIS — E0800 Diabetes mellitus due to underlying condition with hyperosmolarity without nonketotic hyperglycemic-hyperosmolar coma (NKHHC): Secondary | ICD-10-CM

## 2019-01-22 NOTE — Progress Notes (Signed)
Patient presents for vaccination against influenza per orders of Dr. Newlin. Consent given. Counseling provided. No contraindications exists. Vaccine administered without incident.   

## 2019-01-23 LAB — LIPID PANEL
Chol/HDL Ratio: 2.7 ratio (ref 0.0–4.4)
Cholesterol, Total: 146 mg/dL (ref 100–199)
HDL: 54 mg/dL (ref >39)
LDL Chol Calc (NIH): 69 mg/dL (ref 0–99)
Triglycerides: 133 mg/dL (ref 0–149)
VLDL Cholesterol Cal: 23 mg/dL (ref 5–40)

## 2019-01-23 LAB — BASIC METABOLIC PANEL
BUN/Creatinine Ratio: 14 (ref 9–23)
BUN: 11 mg/dL (ref 6–24)
CO2: 27 mmol/L (ref 20–29)
Calcium: 9.5 mg/dL (ref 8.7–10.2)
Chloride: 103 mmol/L (ref 96–106)
Creatinine, Ser: 0.78 mg/dL (ref 0.57–1.00)
GFR calc Af Amer: 96 mL/min/{1.73_m2} (ref >59)
GFR calc non Af Amer: 83 mL/min/{1.73_m2} (ref >59)
Glucose: 121 mg/dL — ABNORMAL HIGH (ref 65–99)
Potassium: 4.3 mmol/L (ref 3.5–5.2)
Sodium: 140 mmol/L (ref 134–144)

## 2019-01-23 LAB — HEMOGLOBIN A1C
Est. average glucose Bld gHb Est-mCnc: 160 mg/dL
Hgb A1c MFr Bld: 7.2 % — ABNORMAL HIGH (ref 4.8–5.6)

## 2019-01-29 ENCOUNTER — Telehealth: Payer: Self-pay

## 2019-01-29 NOTE — Telephone Encounter (Signed)
Patient name and DOB has been verified Patient was informed of lab results. Patient had no questions.  

## 2019-01-29 NOTE — Telephone Encounter (Signed)
-----   Message from Charlott Rakes, MD sent at 01/25/2019  7:56 AM EST ----- Please inform the patient that labs are normal. Thank you.

## 2019-02-15 ENCOUNTER — Other Ambulatory Visit: Payer: Self-pay | Admitting: Family Medicine

## 2019-02-15 DIAGNOSIS — K219 Gastro-esophageal reflux disease without esophagitis: Secondary | ICD-10-CM

## 2019-04-12 ENCOUNTER — Ambulatory Visit: Payer: No Typology Code available for payment source | Admitting: Family Medicine

## 2019-04-13 ENCOUNTER — Ambulatory Visit: Payer: No Typology Code available for payment source | Admitting: Family Medicine

## 2019-04-26 ENCOUNTER — Ambulatory Visit: Payer: Self-pay | Attending: Family Medicine | Admitting: Family Medicine

## 2019-04-26 ENCOUNTER — Encounter: Payer: Self-pay | Admitting: Family Medicine

## 2019-04-26 ENCOUNTER — Other Ambulatory Visit: Payer: Self-pay

## 2019-04-26 DIAGNOSIS — E7800 Pure hypercholesterolemia, unspecified: Secondary | ICD-10-CM

## 2019-04-26 DIAGNOSIS — I10 Essential (primary) hypertension: Secondary | ICD-10-CM

## 2019-04-26 DIAGNOSIS — E1149 Type 2 diabetes mellitus with other diabetic neurological complication: Secondary | ICD-10-CM

## 2019-04-26 MED ORDER — GLUCOSE BLOOD VI STRP: ORAL_STRIP | 12 refills | Status: DC

## 2019-04-26 NOTE — Progress Notes (Signed)
Medical Assistant used [employer] to contact patient.  Interpreter Name: Lenna Sciara Interpreter #: [id number] Patient verified DOB Patient has eaten today and taken medication. Patient denies pain at this time. CBG today before eating was 123.

## 2019-04-26 NOTE — Progress Notes (Signed)
Virtual Visit via Telephone Note  I connected with Carmen Burns, on 04/26/2019 at 8:42 AM by telephone due to the COVID-19 pandemic and verified that I am speaking with the correct person using two identifiers.   Consent: I discussed the limitations, risks, security and privacy concerns of performing an evaluation and management service by telephone and the availability of in person appointments. I also discussed with the patient that there may be a patient responsible charge related to this service. The patient expressed understanding and agreed to proceed.   Location of Patient: Home  Location of Provider: Clinic   Persons participating in Telemedicine visit: Jasmin Winberry [place]-CMA Dr. Juleen Starr Interpreter #: [id number]     History of Present Illness: Carmen Burns is a 59 year old female with a history of type 2 diabetes mellitus (A1c7.75fom 03/2018), diabetic neuropathy hypertension, hyperlipidemia, GERD who presents today for follow-up visit   She has been doing well and her sugars are 103-123; last A1c was 7.2. Her Neuropathy is controlled on Gabapentin. Last eye exam was 2 weeks ago and she denies visual concerns. Compliant with antihypertensive and Statin.  She has not been checking her blood pressures at home but promises to begin doing this. Reflux symptoms are controlled on her PPI. She has no additional concerns today.  Past Medical History:  Diagnosis Date  . Anemia 2013  . [place]   . Hyperlipidemia   . Hypertension   . Refusal of blood transfusions as patient is Jehovah's Witness    Allergies  Allergen Reactions  . Penicillins Itching and Rash    Current Outpatient Medications on File Prior to Visit  Medication Sig Dispense Refill  . atorvastatin (LIPITOR) 40 MG tablet Take 1 tablet (40 mg total) by mouth daily. 90 tablet 1  . Blood Glucose Monitoring Suppl (TRUE METRIX METER) W/DEVICE KIT USE TO CHECK  BLOOD SUGAR THREE TIMES DAILY AND AT BEDTIME 1 kit 0  . cetirizine (ZYRTEC) 10 MG tablet Take 1 tablet (10 mg total) by mouth daily. 30 tablet 3  . gabapentin (NEURONTIN) 300 MG capsule Take 2 capsules (600 mg total) by mouth 2 (two) times daily. 360 capsule 1  . glipiZIDE (GLUCOTROL) 10 MG tablet Take 1 tablet (10 mg total) by mouth 2 (two) times daily before a meal. 180 tablet 1  . glucose blood test strip USE TO CHECK BLOOD SUGAR THREE TIMES DAILY AND AT BEDTIME 100 each 12  . lisinopril-hydrochlorothiazide (ZESTORETIC) 10-12.5 MG tablet Take 1 tablet by mouth daily. 90 tablet 1  . metFORMIN (GLUCOPHAGE-XR) 500 MG 24 hr tablet Take 2 tablets (1,000 mg total) by mouth 2 (two) times daily. 2 tabs daily 360 tablet 1  . naproxen sodium (ANAPROX) 550 MG tablet Take 1 tablet (550 mg total) by mouth 2 (two) times daily with a meal. 60 tablet 1  . pantoprazole (PROTONIX) 40 MG tablet TAKE 1 TABLET (40 MG TOTAL) BY MOUTH DAILY. 30 tablet 2  . sitaGLIPtin (JANUVIA) 100 MG tablet Take 1 tablet (100 mg total) by mouth daily. 90 tablet 1  . TRUEPLUS LANCETS 28G MISC USE TO CHECK BLOOD SUGAR THREE TIMES DAILY AND AT BEDTIME 100 each 2   No current facility-administered medications on file prior to visit.    Observations/Objective: Awake, alert, oriented x3 Not in acute distress  Lab Results  Component Value Date   HGBA1C 7.2 (H) 01/22/2019    Lipid Panel     Component Value Date/Time   CHOL 146 01/22/2019  0855   TRIG 133 01/22/2019 0855   HDL 54 01/22/2019 0855   CHOLHDL 2.7 01/22/2019 0855   CHOLHDL 3.2 01/19/2016 1043   VLDL 31 (H) 01/19/2016 1043   LDLCALC 69 01/22/2019 0855   LABVLDL 23 01/22/2019 0855    CMP Latest Ref Rng & Units 01/22/2019 04/13/2018 09/29/2017  Glucose 65 - 99 mg/dL 121(H) 162(H) 145(H)  BUN 6 - 24 mg/dL _0 Creatinine 0.57 - 1.00 mg/dL 0.78 0.76 0.80  Sodium 134 - 144 mmol/L 140 137 142  Potassium 3.5 - 5.2 mmol/L 4.3 4.4 4.2  Chloride 96 - 106 mmol/L 103  100 101  CO2 20 - 29 mmol/L _1 Calcium 8.7 - 10.2 mg/dL 9.5 9.7 9.9  Total Protein 6.0 - 8.5 g/dL - 7.0 7.4  Total Bilirubin 0.0 - 1.2 mg/dL - 0.3 <0.2  Alkaline Phos 39 - 117 IU/L - 124(H) 132(H)  AST 0 - 40 IU/L - 19 24  ALT 0 - 32 IU/L - 23 23     Assessment and Plan: 1. Other diabetic neurological complication associated with type 2 diabetes mellitus ([place]) Diabetes mellitus is controlled with A1c of 7.2; goal is less than 7 Continue current diabetic regimen Neuropathy is controlled on gabapentin Up-to-date on eye exam Counseled on Diabetic diet, my plate method, 295 minutes of moderate intensity exercise/week Blood sugar logs with fasting goals of 80-120 mg/dl, random of less than 180 and in the event of sugars less than 60 mg/dl or greater than 400 mg/dl encouraged to notify the clinic. Advised on the need for annual eye exams, annual foot exams, Pneumonia vaccine.  2. Essential hypertension Last blood pressure in person visit was 132/82 Advised to check blood pressures at home Continue current regimen Next visit will be in person Counseled on blood pressure goal of less than 130/80, low-sodium, DASH diet, medication compliance, 150 minutes of moderate intensity exercise per week. Discussed medication compliance, adverse effects.   3. Pure hypercholesterolemia Controlled Continue statin Counseled on low-cholesterol diet    HCM - foot exam at next visit; up to date and mammogram  Follow Up Instructions: 3 months in person   I discussed the assessment and treatment plan with the patient. The patient was provided an opportunity to ask questions and all were answered. The patient agreed with the plan and demonstrated an understanding of the instructions.   The patient was advised to call back or seek an in-person evaluation if the symptoms worsen or if the condition fails to improve as anticipated.     I provided 15 minutes total of non-face-to-face time  during this encounter including median intraservice time, reviewing previous notes, investigations, ordering medications, medical decision making, coordinating care and patient verbalized understanding at the end of the visit.     Charlott Rakes, MD, FAAFP. [facility] and [place], [place]   04/26/2019, 8:42 AM

## 2019-05-17 ENCOUNTER — Other Ambulatory Visit: Payer: Self-pay | Admitting: Family Medicine

## 2019-05-17 DIAGNOSIS — K219 Gastro-esophageal reflux disease without esophagitis: Secondary | ICD-10-CM

## 2019-07-28 ENCOUNTER — Encounter: Payer: Self-pay | Admitting: Family Medicine

## 2019-07-28 ENCOUNTER — Ambulatory Visit: Payer: Self-pay | Attending: Family Medicine | Admitting: Family Medicine

## 2019-07-28 ENCOUNTER — Other Ambulatory Visit: Payer: Self-pay | Admitting: Family Medicine

## 2019-07-28 ENCOUNTER — Other Ambulatory Visit: Payer: Self-pay

## 2019-07-28 VITALS — BP 127/83 | HR 99 | Ht 63.0 in | Wt 215.0 lb

## 2019-07-28 DIAGNOSIS — E1169 Type 2 diabetes mellitus with other specified complication: Secondary | ICD-10-CM

## 2019-07-28 DIAGNOSIS — E7800 Pure hypercholesterolemia, unspecified: Secondary | ICD-10-CM

## 2019-07-28 DIAGNOSIS — I10 Essential (primary) hypertension: Secondary | ICD-10-CM

## 2019-07-28 DIAGNOSIS — K219 Gastro-esophageal reflux disease without esophagitis: Secondary | ICD-10-CM

## 2019-07-28 DIAGNOSIS — J302 Other seasonal allergic rhinitis: Secondary | ICD-10-CM

## 2019-07-28 DIAGNOSIS — B351 Tinea unguium: Secondary | ICD-10-CM

## 2019-07-28 DIAGNOSIS — E1149 Type 2 diabetes mellitus with other diabetic neurological complication: Secondary | ICD-10-CM

## 2019-07-28 LAB — POCT GLYCOSYLATED HEMOGLOBIN (HGB A1C): HbA1c, POC (controlled diabetic range): 6.9 % (ref 0.0–7.0)

## 2019-07-28 LAB — GLUCOSE, POCT (MANUAL RESULT ENTRY): POC Glucose: 146 mg/dl — AB (ref 70–99)

## 2019-07-28 MED ORDER — GABAPENTIN 300 MG PO CAPS: 600.0000 mg | ORAL_CAPSULE | Freq: Two times a day (BID) | ORAL | 1 refills | Status: DC

## 2019-07-28 MED ORDER — TERBINAFINE HCL 250 MG PO TABS: 250.0000 mg | ORAL_TABLET | Freq: Every day | ORAL | 1 refills | Status: DC

## 2019-07-28 MED ORDER — SITAGLIPTIN PHOSPHATE 100 MG PO TABS: 100.0000 mg | ORAL_TABLET | Freq: Every day | ORAL | 1 refills | Status: DC

## 2019-07-28 MED ORDER — CETIRIZINE HCL 10 MG PO TABS: 10.0000 mg | ORAL_TABLET | Freq: Every day | ORAL | 3 refills | Status: DC

## 2019-07-28 MED ORDER — OLOPATADINE HCL 0.1 % OP SOLN: 1.0000 [drp] | Freq: Two times a day (BID) | OPHTHALMIC | 1 refills | Status: DC

## 2019-07-28 MED ORDER — TERBINAFINE HCL 250 MG PO TABS: 250.0000 mg | ORAL_TABLET | Freq: Every day | ORAL | 2 refills | Status: DC

## 2019-07-28 MED ORDER — PANTOPRAZOLE SODIUM 40 MG PO TBEC: 40.0000 mg | DELAYED_RELEASE_TABLET | Freq: Every day | ORAL | 2 refills | Status: DC

## 2019-07-28 MED ORDER — ATORVASTATIN CALCIUM 40 MG PO TABS: 40.0000 mg | ORAL_TABLET | Freq: Every day | ORAL | 1 refills | Status: DC

## 2019-07-28 MED ORDER — METFORMIN HCL ER 500 MG PO TB24: 1000.0000 mg | ORAL_TABLET | Freq: Two times a day (BID) | ORAL | 1 refills | Status: DC

## 2019-07-28 MED ORDER — GLIPIZIDE 10 MG PO TABS: 10.0000 mg | ORAL_TABLET | Freq: Two times a day (BID) | ORAL | 1 refills | Status: DC

## 2019-07-28 MED ORDER — LISINOPRIL-HYDROCHLOROTHIAZIDE 10-12.5 MG PO TABS: 1.0000 | ORAL_TABLET | Freq: Every day | ORAL | 1 refills | Status: DC

## 2019-07-28 NOTE — Patient Instructions (Addendum)
Infeccin por hongos en las uas Fungal Nail Infection La infeccin por hongos en las uas es una infeccin frecuente de las uas de los pies o de las manos. Este trastorno [employer] uas de los pies con ms frecuencia que las uas de las manos. Generalmente afecta al dedo gordo del pie. Ms de Ardelia Mems ua puede infectarse. Esta afeccin puede transmitirse de [place] persona a otra (es contagiosa). Cules son las causas? La causa de esta afeccin es un hongo. Son varios los tipos de hongos que pueden causar la infeccin. Estos hongos son frecuentes en las zonas hmedas y clidas. Si las manos o los pies entran en contacto con los hongos, se pueden introducir en una ruptura de las uas de las manos o de los pies y Immunologist infeccin. Qu incrementa el riesgo? Los siguientes factores pueden hacer que usted sea ms propenso a Public librarian afeccin:  Ser hombre.  Ser Ardelia Mems persona de edad avanzada.  Convivir con alguien que tiene hongos.  Caminar descalzo en zonas donde proliferan hongos, como duchas o vestuarios.  Usar zapatos y calcetines que [employer].  Tener una ua lastimada o haberse sometido a una ciruga de uas recientemente.  Tener ciertas afecciones, por ejemplo: ? Pie de atleta. ? Diabetes. ? Psoriasis. ? Mala circulacin. ? Debilitamiento del sistema de defensa del organismo (sistema inmunitario). Cules son los signos o los sntomas? Los sntomas de esta afeccin incluyen:  Una mancha plida sobre la ua.  Engrosamiento de la ua.  Una ua que se torna amarilla o [place].  [place] uas rugosos o quebradizos.  Una ua que se cae.  Una ua que se ha desprendido del lecho ungueal. Cmo se diagnostica? Esta afeccin se diagnostica mediante un examen fsico. El mdico podr tomar una muestra de la ua para examinarla y Hydrographic surveyor si tiene hongos. Cmo se trata? No es necesario realizar tratamiento si la infeccin es leve. Si tiene [employer] uas, el tratamiento puede incluir lo siguiente:  Antimicticos que se toman por boca (va oral). Deber tomar los medicamentos durante algunas semanas o meses y no ver los resultados hasta despus de un [place]. Estos medicamentos pueden tener efectos secundarios. Consulte al [employer] a los que debe estar atento.  Cremas o esmaltes de uas antimicticos. Se pueden usar junto con los medicamentos antimicticos que se administran por va oral.  Tratamiento lser de las uas.  Ciruga para extirpar la ua. Esto puede ser [employer] casos ms graves de infecciones. La infeccin puede tardar un largo tiempo en desaparecer, habitualmente hasta un ao. Adems, la infeccin puede regresar. Siga estas indicaciones en su casa: Medicamentos  Tome o aplquese los medicamentos de venta libre y los recetados solamente como se lo haya indicado el mdico.  Consulte al mdico sobre el uso de pomadas mentoladas para las uas de [place]. [place] uas con Camera operator.  Lvese y [place] y los pies todos [place].  Mantenga los pies secos: ? Use calcetines absorbentes y cmbiese los calcetines con frecuencia. ? Use un tipo de calzado que permita que el aire [place], como sandalias o zapatillas de lona. Deseche los zapatos viejos.  No use uas artificiales.  Si va al saln de esttica de uas, asegrese de elegir uno en el que se usen instrumentos limpios.  Aplquese polvo antimictico en los pies y en los zapatos. Indicaciones generales  No comparta elementos personales  como toallas o cortauas.  No camine descalzo en duchas o vestuarios.  Use guantes de goma si est trabajando con sus manos en lugares mojados.  Concurra a todas las visitas de control como se lo haya indicado el mdico. Esto es importante. Comunquese con un mdico si: La infeccin no mejora o si empeora despus de varios meses. Resumen  La infeccin por  hongos en las uas es una infeccin frecuente de las uas de los pies o de las manos.  No es necesario realizar tratamiento si la infeccin es leve. Si tiene [employer] uas, el tratamiento puede incluir la administracin de medicamentos por va oral y la aplicacin de un medicamento en las uas.  La infeccin puede tardar un largo tiempo en desaparecer, habitualmente hasta un ao. Adems, la infeccin puede regresar.  Tome o aplquese los medicamentos de venta libre y los recetados solamente como se lo haya indicado el mdico.  Siga las instrucciones de cuidado de las uas a fin de ayudar a Product/process development scientist que la infeccin regrese o se extienda. Esta informacin no tiene Marine scientist el consejo del mdico. Asegrese de hacerle al mdico cualquier pregunta que tenga. Document Revised: 09/18/2017 Document Reviewed: 09/18/2017 Elsevier Patient Education  [place].

## 2019-07-28 NOTE — Progress Notes (Signed)
Subjective:  Patient ID: Carmen Burns, female    DOB: Oct 25, 1960  Age: 59 y.o. MRN: [id number]  CC: Diabetes   HPI Carmen Burns is a 59 year old female with a history of type 2 diabetes mellitus (A1c6.9), diabetic neuropathy hypertension, hyperlipidemia, GERD who presents today for follow-up visit  Lately she has had cough, watery eyes which she attributes to allergies.  Denies presence of fever, myalgias, sore throat or chest pain. She also complains of discoloration of her right big toenail and is unsure of the duration of nail changes. Blood sugars run between 96-100; she denies presence of hypoglycemia. Her neuropathy is controlled on Gabapentin  Doing well on her antihypertensive and her statin.  Reflux is controlled on her PPI.  Past Medical History:  Diagnosis Date  . Anemia 2013  . Diabetes mellitus without complication ([place])   . Hyperlipidemia   . Hypertension   . Refusal of blood transfusions as patient is Jehovah's Witness     Past Surgical History:  Procedure Laterality Date  . APPENDECTOMY    . DILITATION & CURRETTAGE/HYSTROSCOPY WITH NOVASURE ABLATION N/A 01/12/2013   Procedure: DILATATION & CURETTAGE/HYSTEROSCOPY WITH NOVASURE ABLATION;  Surgeon: Osborne Oman, MD;  Location: [facility];  Service: Gynecology;  Laterality: N/A;    Family History  Problem Relation Age of Onset  . Diabetes Brother   . Hypertension Brother   . Diabetes Brother   . Diabetes Brother   . Diabetes Brother   . Diabetes Brother   . Colon cancer Neg Hx   . Breast cancer Neg Hx   . Rectal cancer Neg Hx   . Stomach cancer Neg Hx   . Esophageal cancer Neg Hx     Allergies  Allergen Reactions  . Penicillins Itching and Rash    Outpatient Medications Prior to Visit  Medication Sig Dispense Refill  . atorvastatin (LIPITOR) 40 MG tablet Take 1 tablet (40 mg total) by mouth daily. 90 tablet 1  . Blood Glucose Monitoring Suppl (TRUE METRIX METER) W/DEVICE KIT USE TO CHECK  BLOOD SUGAR THREE TIMES DAILY AND AT BEDTIME 1 kit 0  . cetirizine (ZYRTEC) 10 MG tablet Take 1 tablet (10 mg total) by mouth daily. 30 tablet 3  . gabapentin (NEURONTIN) 300 MG capsule Take 2 capsules (600 mg total) by mouth 2 (two) times daily. 360 capsule 1  . glipiZIDE (GLUCOTROL) 10 MG tablet Take 1 tablet (10 mg total) by mouth 2 (two) times daily before a meal. 180 tablet 1  . glucose blood test strip USE TO CHECK BLOOD SUGAR THREE TIMES DAILY AND AT BEDTIME 100 each 12  . lisinopril-hydrochlorothiazide (ZESTORETIC) 10-12.5 MG tablet Take 1 tablet by mouth daily. 90 tablet 1  . metFORMIN (GLUCOPHAGE-XR) 500 MG 24 hr tablet Take 2 tablets (1,000 mg total) by mouth 2 (two) times daily. 2 tabs daily 360 tablet 1  . naproxen sodium (ANAPROX) 550 MG tablet Take 1 tablet (550 mg total) by mouth 2 (two) times daily with a meal. 60 tablet 1  . pantoprazole (PROTONIX) 40 MG tablet TAKE 1 TABLET (40 MG TOTAL) BY MOUTH DAILY. 30 tablet 2  . sitaGLIPtin (JANUVIA) 100 MG tablet Take 1 tablet (100 mg total) by mouth daily. 90 tablet 1  . TRUEPLUS LANCETS 28G MISC USE TO CHECK BLOOD SUGAR THREE TIMES DAILY AND AT BEDTIME 100 each 2   No facility-administered medications prior to visit.     ROS Review of Systems  Constitutional: Negative for activity change, appetite change and  fatigue.  HENT: Negative for congestion, sinus pressure and sore throat.   Eyes: Negative for visual disturbance.  Respiratory: Negative for cough, chest tightness, shortness of breath and wheezing.   Cardiovascular: Negative for chest pain and palpitations.  Gastrointestinal: Negative for abdominal distention, abdominal pain and constipation.  Endocrine: Negative for polydipsia.  Genitourinary: Negative for dysuria and frequency.  Musculoskeletal: Negative for arthralgias and back pain.  Skin: Negative for rash.  Neurological: Negative for tremors, light-headedness and numbness.  Hematological: Does not bruise/bleed  easily.  Psychiatric/Behavioral: Negative for agitation and behavioral problems.    Objective:  BP 127/83   Pulse 99   Ht 5' 3"  (1.6 m)   Wt 215 lb (97.5 kg)   LMP 06/20/2015 Comment: no chance pregnant,husband has had vasectomy pt sates  SpO2 97%   BMI 38.09 kg/m   BP/Weight 07/28/2019 12/29/2018 0/10/6759  Systolic BP 950 932 671  Diastolic BP 83 92 82  Wt. (Lbs) 215 222 216.8  BMI 38.09 39.33 38.4      Physical Exam Constitutional:      Appearance: She is well-developed.  Neck:     Vascular: No JVD.  Cardiovascular:     Rate and Rhythm: Normal rate.     Heart sounds: Normal heart sounds. No murmur.  Pulmonary:     Effort: Pulmonary effort is normal.     Breath sounds: Normal breath sounds. No wheezing or rales.  Chest:     Chest wall: No tenderness.  Abdominal:     General: Bowel sounds are normal. There is no distension.     Palpations: Abdomen is soft. There is no mass.     Tenderness: There is no abdominal tenderness.  Musculoskeletal:        General: Normal range of motion.     Right lower leg: No edema.     Left lower leg: No edema.  Skin:    Comments: Thickened dystrophic right big toenail  Neurological:     Mental Status: She is alert and oriented to person, place, and time.  Psychiatric:        Mood and Affect: Mood normal.     CMP Latest Ref Rng & Units 01/22/2019 04/13/2018 09/29/2017  Glucose 65 - 99 mg/dL 121(H) 162(H) 145(H)  BUN 6 - 24 mg/dL 11 12 13   Creatinine 0.57 - 1.00 mg/dL 0.78 0.76 0.80  Sodium 134 - 144 mmol/L 140 137 142  Potassium 3.5 - 5.2 mmol/L 4.3 4.4 4.2  Chloride 96 - 106 mmol/L 103 100 101  CO2 20 - 29 mmol/L 27 23 22   Calcium 8.7 - 10.2 mg/dL 9.5 9.7 9.9  Total Protein 6.0 - 8.5 g/dL - 7.0 7.4  Total Bilirubin 0.0 - 1.2 mg/dL - 0.3 <0.2  Alkaline Phos 39 - 117 IU/L - 124(H) 132(H)  AST 0 - 40 IU/L - 19 24  ALT 0 - 32 IU/L - 23 23    Lipid Panel     Component Value Date/Time   CHOL 146 01/22/2019 0855   TRIG 133  01/22/2019 0855   HDL 54 01/22/2019 0855   CHOLHDL 2.7 01/22/2019 0855   CHOLHDL 3.2 01/19/2016 1043   VLDL 31 (H) 01/19/2016 1043   LDLCALC 69 01/22/2019 0855    CBC    Component Value Date/Time   WBC 8.4 03/22/2015 0918   RBC 4.61 03/22/2015 0918   HGB 13.8 03/22/2015 0918   HCT 41.9 03/22/2015 0918   PLT 286 03/22/2015 0918   MCV 90.9 03/22/2015  1610   MCV 60.0 (A) 04/30/2012 1328   MCH 29.9 03/22/2015 0918   MCHC 32.9 03/22/2015 0918   RDW 13.1 03/22/2015 0918   LYMPHSABS 2.7 03/22/2015 0918   MONOABS 0.6 03/22/2015 0918   EOSABS 0.3 03/22/2015 0918   BASOSABS 0.0 03/22/2015 9604    Lab Results  Component Value Date   HGBA1C 6.9 07/28/2019     Assessment & Plan:  1. Type 2 diabetes mellitus with other specified complication, without long-term current use of insulin (HCC) Controlled with A1c of 6.9 No regimen change today Counseled on Diabetic diet, my plate method, 540 minutes of moderate intensity exercise/week Blood sugar logs with fasting goals of 80-120 mg/dl, random of less than 180 and in the event of sugars less than 60 mg/dl or greater than 400 mg/dl encouraged to notify the clinic. Advised on the need for annual eye exams, annual foot exams, Pneumonia vaccine. - POCT glucose (manual entry) - POCT glycosylated hemoglobin (Hb A1C) - metFORMIN (GLUCOPHAGE-XR) 500 MG 24 hr tablet; Take 2 tablets (1,000 mg total) by mouth 2 (two) times daily. 2 tabs daily  Dispense: 360 tablet; Refill: 1 - glipiZIDE (GLUCOTROL) 10 MG tablet; Take 1 tablet (10 mg total) by mouth 2 (two) times daily before a meal.  Dispense: 180 tablet; Refill: 1  2. Essential hypertension Controlled Counseled on blood pressure goal of less than 130/80, low-sodium, DASH diet, medication compliance, 150 minutes of moderate intensity exercise per week. Discussed medication compliance, adverse effects. - lisinopril-hydrochlorothiazide (ZESTORETIC) 10-12.5 MG tablet; Take 1 tablet by mouth daily.   Dispense: 90 tablet; Refill: 1  3. Pure hypercholesterolemia Controlled Low-cholesterol diet - atorvastatin (LIPITOR) 40 MG tablet; Take 1 tablet (40 mg total) by mouth daily.  Dispense: 90 tablet; Refill: 1  4. Other diabetic neurological complication associated with type 2 diabetes mellitus (HCC) Controlled - Microalbumin / creatinine urine ratio - gabapentin (NEURONTIN) 300 MG capsule; Take 2 capsules (600 mg total) by mouth 2 (two) times daily.  Dispense: 360 capsule; Refill: 1 - CMP14+EGFR - Lipid panel  5. Gastroesophageal reflux disease without esophagitis Stable - pantoprazole (PROTONIX) 40 MG tablet; Take 1 tablet (40 mg total) by mouth daily.  Dispense: 30 tablet; Refill: 2  6. Seasonal allergies Placed on Zyrtec and Patanol  7. Onychomycosis Placed on terbinafine      Charlott Rakes, MD, FAAFP. [facility] and [place], [place]   07/28/2019, 10:47 AM

## 2019-07-29 ENCOUNTER — Telehealth: Payer: Self-pay

## 2019-07-29 LAB — CMP14+EGFR
ALT: 26 IU/L (ref 0–32)
AST: 28 IU/L (ref 0–40)
Albumin/Globulin Ratio: 1.3 (ref 1.2–2.2)
Albumin: 4.2 g/dL (ref 3.8–4.9)
Alkaline Phosphatase: 117 IU/L (ref 39–117)
BUN/Creatinine Ratio: 9 — ABNORMAL LOW (ref 12–28)
BUN: 7 mg/dL — ABNORMAL LOW (ref 8–27)
Bilirubin Total: 0.4 mg/dL (ref 0.0–1.2)
CO2: 21 mmol/L (ref 20–29)
Calcium: 9.4 mg/dL (ref 8.7–10.3)
Chloride: 102 mmol/L (ref 96–106)
Creatinine, Ser: 0.81 mg/dL (ref 0.57–1.00)
GFR calc Af Amer: 91 mL/min/{1.73_m2} (ref >59)
GFR calc non Af Amer: 79 mL/min/{1.73_m2} (ref >59)
Globulin, Total: 3.3 g/dL (ref 1.5–4.5)
Glucose: 147 mg/dL — ABNORMAL HIGH (ref 65–99)
Potassium: 4.4 mmol/L (ref 3.5–5.2)
Sodium: 140 mmol/L (ref 134–144)
Total Protein: 7.5 g/dL (ref 6.0–8.5)

## 2019-07-29 LAB — LIPID PANEL
Chol/HDL Ratio: 3.4 ratio (ref 0.0–4.4)
Cholesterol, Total: 174 mg/dL (ref 100–199)
HDL: 51 mg/dL (ref >39)
LDL Chol Calc (NIH): 100 mg/dL — ABNORMAL HIGH (ref 0–99)
Triglycerides: 131 mg/dL (ref 0–149)
VLDL Cholesterol Cal: 23 mg/dL (ref 5–40)

## 2019-07-29 NOTE — Telephone Encounter (Signed)
-----   Message from Charlott Rakes, MD sent at 07/29/2019  2:01 PM EDT ----- Please inform the patient that labs are normal. Thank you.

## 2019-07-29 NOTE — Telephone Encounter (Signed)
Patient name and DOB has been verified Patient was informed of lab results. Patient had no questions.  

## 2019-10-04 ENCOUNTER — Other Ambulatory Visit: Payer: Self-pay | Admitting: Family Medicine

## 2019-10-04 NOTE — Telephone Encounter (Signed)
Requested Prescriptions  Pending Prescriptions Disp Refills  . JANUVIA 100 MG tablet [Pharmacy Med Name: JANUVIA 100MG  TAB] 90 tablet 1    Sig: TAKE 1 TABLET BY MOUTH EVERY DAY     Endocrinology:  Diabetes - DPP-4 Inhibitors Passed - 10/04/2019  6:45 PM      Passed - HBA1C is between 0 and 7.9 and within 180 days    HbA1c, POC (controlled diabetic range)  Date Value Ref Range Status  07/28/2019 6.9 0.0 - 7.0 % Final         Passed - Cr in normal range and within 360 days    Creat  Date Value Ref Range Status  01/19/2016 0.88 0.50 - 1.05 mg/dL Final    Comment:      For patients > or = 61 years of age: The upper reference limit for Creatinine is approximately 13% higher for people identified as African-American.      Creatinine, Ser  Date Value Ref Range Status  07/28/2019 0.81 0.57 - 1.00 mg/dL Final   Creatinine, Urine  Date Value Ref Range Status  01/19/2016 60 20 - 320 mg/dL Final         Passed - Valid encounter within last 6 months    Recent Outpatient Visits          2 months ago Type 2 diabetes mellitus with other specified complication, without long-term current use of insulin (HCC)   [place], [place], MD   5 months ago Other diabetic neurological complication associated with type 2 diabetes mellitus ([place])   [place], Enobong, MD   8 months ago Pure hypercholesterolemia   [place], Charlane Ferretti, MD   1 year ago Pure hypercholesterolemia   [place], Charlane Ferretti, MD   1 year ago Diabetes mellitus due to underlying condition with hyperosmolarity without coma, without long-term current use of insulin [facility])   [place], Enobong, MD             Previous RX for this medication filled at [facility] and [facility].  This is for a mail order pharmacy.

## 2019-11-12 ENCOUNTER — Other Ambulatory Visit: Payer: Self-pay | Admitting: Family Medicine

## 2019-11-12 DIAGNOSIS — K219 Gastro-esophageal reflux disease without esophagitis: Secondary | ICD-10-CM

## 2019-12-15 ENCOUNTER — Ambulatory Visit: Payer: No Typology Code available for payment source | Attending: Family Medicine | Admitting: Family Medicine

## 2019-12-15 ENCOUNTER — Other Ambulatory Visit: Payer: Self-pay | Admitting: Family Medicine

## 2019-12-15 DIAGNOSIS — E7800 Pure hypercholesterolemia, unspecified: Secondary | ICD-10-CM

## 2019-12-15 DIAGNOSIS — I10 Essential (primary) hypertension: Secondary | ICD-10-CM

## 2019-12-15 DIAGNOSIS — E1169 Type 2 diabetes mellitus with other specified complication: Secondary | ICD-10-CM

## 2019-12-15 MED ORDER — METFORMIN HCL ER 500 MG PO TB24: 1000.0000 mg | ORAL_TABLET | Freq: Two times a day (BID) | ORAL | 1 refills | Status: DC

## 2019-12-15 MED ORDER — SITAGLIPTIN PHOSPHATE 100 MG PO TABS: 100.0000 mg | ORAL_TABLET | Freq: Every day | ORAL | 1 refills | Status: DC

## 2019-12-15 MED ORDER — LISINOPRIL-HYDROCHLOROTHIAZIDE 10-12.5 MG PO TABS: 1.0000 | ORAL_TABLET | Freq: Every day | ORAL | 1 refills | Status: DC

## 2019-12-15 MED ORDER — GLIPIZIDE 10 MG PO TABS: 10.0000 mg | ORAL_TABLET | Freq: Two times a day (BID) | ORAL | 1 refills | Status: DC

## 2019-12-15 MED ORDER — ATORVASTATIN CALCIUM 40 MG PO TABS: 40.0000 mg | ORAL_TABLET | Freq: Every day | ORAL | 1 refills | Status: DC

## 2019-12-15 NOTE — Progress Notes (Signed)
Virtual Visit via Telephone Note  I connected with Carmen Burns, on 12/15/2019 at 9:54 AM by telephone due to the COVID-19 pandemic and verified that I am speaking with the correct person using two identifiers.   Consent: I discussed the limitations, risks, security and privacy concerns of performing an evaluation and management service by telephone and the availability of in person appointments. I also discussed with the patient that there may be a patient responsible charge related to this service. The patient expressed understanding and agreed to proceed.   Location of Patient: Home  Location of Provider: Clinic   Persons participating in Telemedicine visit: Carmen Burns-CMA Dr. Margarita Rana Interpreter     History of Present Illness: [name] is a64 year old female with a history of type 2 diabetes mellitus (A1c6.9), diabetic neuropathy hypertension, hyperlipidemia, GERD who presents today for follow-up visit   She has no concerns today. Blood sugars at home have been controlled with fasting around 107 and she denies hypoglycemia or visual concerns and neuropathy is controlled on gabapentin She endorses compliance with her medications. With regards to her blood pressure she is compliant with her antihypertensive.  Past Medical History:  Diagnosis Date  . Anemia 2013  . Diabetes mellitus without complication ([place])   . Hyperlipidemia   . Hypertension   . Refusal of blood transfusions as patient is Jehovah's Witness    Allergies  Allergen Reactions  . Penicillins Itching and Rash    Current Outpatient Medications on File Prior to Visit  Medication Sig Dispense Refill  . atorvastatin (LIPITOR) 40 MG tablet Take 1 tablet (40 mg total) by mouth daily. 90 tablet 1  . Blood Glucose Monitoring Suppl (TRUE METRIX METER) W/DEVICE KIT USE TO CHECK BLOOD SUGAR THREE TIMES DAILY AND AT BEDTIME 1 kit 0  . cetirizine (ZYRTEC) 10 MG tablet Take 1 tablet  (10 mg total) by mouth daily. 30 tablet 3  . gabapentin (NEURONTIN) 300 MG capsule Take 2 capsules (600 mg total) by mouth 2 (two) times daily. 360 capsule 1  . glipiZIDE (GLUCOTROL) 10 MG tablet Take 1 tablet (10 mg total) by mouth 2 (two) times daily before a meal. 180 tablet 1  . glucose blood test strip USE TO CHECK BLOOD SUGAR THREE TIMES DAILY AND AT BEDTIME 100 each 12  . JANUVIA 100 MG tablet TAKE 1 TABLET BY MOUTH EVERY DAY 90 tablet 1  . lisinopril-hydrochlorothiazide (ZESTORETIC) 10-12.5 MG tablet Take 1 tablet by mouth daily. 90 tablet 1  . metFORMIN (GLUCOPHAGE-XR) 500 MG 24 hr tablet Take 2 tablets (1,000 mg total) by mouth 2 (two) times daily. 2 tabs daily 360 tablet 1  . naproxen sodium (ANAPROX) 550 MG tablet Take 1 tablet (550 mg total) by mouth 2 (two) times daily with a meal. 60 tablet 1  . olopatadine (PATANOL) 0.1 % ophthalmic solution Place 1 drop into both eyes 2 (two) times daily. 5 mL 1  . pantoprazole (PROTONIX) 40 MG tablet TAKE 1 TABLET (40 MG TOTAL) BY MOUTH DAILY. 30 tablet 2  . terbinafine (LAMISIL) 250 MG tablet Take 1 tablet (250 mg total) by mouth daily. 30 tablet 2  . TRUEPLUS LANCETS 28G MISC USE TO CHECK BLOOD SUGAR THREE TIMES DAILY AND AT BEDTIME 100 each 2   No current facility-administered medications on file prior to visit.    Observations/Objective: Awake, alert, oriented x3 Not in acute distress  Lab Results  Component Value Date   HGBA1C 6.9 07/28/2019    Assessment and Plan:  1. Pure hypercholesterolemia Controlled Low-cholesterol diet - atorvastatin (LIPITOR) 40 MG tablet; Take 1 tablet (40 mg total) by mouth daily.  Dispense: 90 tablet; Refill: 1  2. Type 2 diabetes mellitus with other specified complication, without long-term current use of insulin (HCC) Controlled with A1c of 6.9; goal is less than 7.0 Continue current management Counseled on Diabetic diet, my plate method, 921 minutes of moderate intensity exercise/week Blood sugar  logs with fasting goals of 80-120 mg/dl, random of less than 180 and in the event of sugars less than 60 mg/dl or greater than 400 mg/dl encouraged to notify the clinic. Advised on the need for annual eye exams, annual foot exams, Pneumonia vaccine. - glipiZIDE (GLUCOTROL) 10 MG tablet; Take 1 tablet (10 mg total) by mouth 2 (two) times daily before a meal.  Dispense: 180 tablet; Refill: 1 - metFORMIN (GLUCOPHAGE-XR) 500 MG 24 hr tablet; Take 2 tablets (1,000 mg total) by mouth 2 (two) times daily. 2 tabs daily  Dispense: 360 tablet; Refill: 1 - sitaGLIPtin (JANUVIA) 100 MG tablet; Take 1 tablet (100 mg total) by mouth daily.  Dispense: 90 tablet; Refill: 1  3. Essential hypertension Controlled Counseled on blood pressure goal of less than 130/80, low-sodium, DASH diet, medication compliance, 150 minutes of moderate intensity exercise per week. Discussed medication compliance, adverse effects. - lisinopril-hydrochlorothiazide (ZESTORETIC) 10-12.5 MG tablet; Take 1 tablet by mouth daily.  Dispense: 90 tablet; Refill: 1   Follow Up Instructions: 3 months for chronic disease management   I discussed the assessment and treatment plan with the patient. The patient was provided an opportunity to ask questions and all were answered. The patient agreed with the plan and demonstrated an understanding of the instructions.   The patient was advised to call back or seek an in-person evaluation if the symptoms worsen or if the condition fails to improve as anticipated.     I provided 11 minutes total of non-face-to-face time during this encounter including median intraservice time, reviewing previous notes, investigations, ordering medications, medical decision making, coordinating care and patient verbalized understanding at the end of the visit.     Charlott Rakes, MD, FAAFP. [facility] and [place], [place]   12/15/2019, 9:54 AM

## 2019-12-15 NOTE — Progress Notes (Signed)
Has no concerns today. °

## 2019-12-22 ENCOUNTER — Other Ambulatory Visit: Payer: Self-pay | Admitting: Obstetrics and Gynecology

## 2019-12-22 DIAGNOSIS — Z1231 Encounter for screening mammogram for malignant neoplasm of breast: Secondary | ICD-10-CM

## 2020-02-10 ENCOUNTER — Ambulatory Visit
Admission: RE | Admit: 2020-02-10 | Discharge: 2020-02-10 | Disposition: A | Discharge status: Home / Self Care | Payer: No Typology Code available for payment source | Source: Ambulatory Visit | Attending: Obstetrics and Gynecology | Admitting: Obstetrics and Gynecology

## 2020-02-10 ENCOUNTER — Other Ambulatory Visit: Payer: Self-pay

## 2020-02-10 ENCOUNTER — Ambulatory Visit: Payer: No Typology Code available for payment source | Admitting: *Deleted

## 2020-02-10 VITALS — BP 140/82 | Wt 213.1 lb

## 2020-02-10 DIAGNOSIS — Z1239 Encounter for other screening for malignant neoplasm of breast: Secondary | ICD-10-CM

## 2020-02-10 DIAGNOSIS — Z1231 Encounter for screening mammogram for malignant neoplasm of breast: Secondary | ICD-10-CM

## 2020-02-10 NOTE — Progress Notes (Signed)
[name] is a 59 y.o. female who presents to [facility] clinic today with no complaints.    Pap Smear: Pap smear not completed today. Last Pap smear was 04/25/2017 at [facility] and normal with negative HPV. Per patient has no history of an abnormal Pap smear. Last Pap smear result is in Epic.   Physical exam: Breasts Breasts symmetrical. No skin abnormalities bilateral breasts. No nipple retraction bilateral breasts. No nipple discharge bilateral breasts. No lymphadenopathy. No lumps palpated bilateral breasts. No complaints of pain or tenderness on exam.      Pelvic/Bimanual Pap is not indicated today per BCCCP guidelines.   Smoking History: Patient has never smoked.   Patient Navigation: Patient education provided. Access to services provided for patient through [place] program. Spanish interpreter Rudene Anda from [facility] provided.   Colorectal Cancer Screening: Per patient has had colonoscopy completed on 11/27/2017. No complaints today.    Breast and Cervical Cancer Risk Assessment: Patient does not have family history of breast cancer, known genetic mutations, or radiation treatment to the chest before age 14. Patient does not have history of cervical dysplasia, immunocompromised, or DES exposure in-utero.  Risk Assessment    Risk Scores      02/10/2020 12/29/2018   Last edited by: Demetrius Revel, LPN Tiaira Arambula, Heath Gold, RN   5-year risk: 0.7 % 0.7 %   Lifetime risk: 3.7 % 3.8 %          A: BCCCP exam without pap smear No complaints.  P: Referred patient to the [facility] for a screening mammogram on mobile unit. Appointment scheduled Thursday, February 10, 2020 at 1330.  Loletta Parish, RN 02/10/2020 12:43 PM

## 2020-02-10 NOTE — Patient Instructions (Addendum)
Explained breast self awareness with [name]. Patient did not need a Pap smear today due to last Pap smear and HPV typing was 04/25/2017. Let her know BCCCP will cover Pap smears and HPV typing every 5 years unless has a history of abnormal Pap smears. Referred patient to the [facility] for a screening mammogram on mobile unit. Appointment scheduled Thursday, February 10, 2020 at 1330. Patient escorted to mobile unit following BCCCP appointment for her screening mammogram. Let [facility] know [facility] will [facility] up with her within the next couple weeks with results of her mammogram by letter or phone. Carmen Burns verbalized understanding.  Bryanne Riquelme, Arvil Chaco, RN 12:43 PM

## 2020-03-02 ENCOUNTER — Ambulatory Visit: Admission: RE | Admit: 2020-03-02 | Payer: No Typology Code available for payment source | Source: Ambulatory Visit

## 2020-03-02 ENCOUNTER — Ambulatory Visit
Admission: RE | Admit: 2020-03-02 | Discharge: 2020-03-02 | Disposition: A | Discharge status: Home / Self Care | Payer: No Typology Code available for payment source | Source: Ambulatory Visit | Attending: Obstetrics and Gynecology | Admitting: Obstetrics and Gynecology

## 2020-03-02 ENCOUNTER — Ambulatory Visit: Payer: No Typology Code available for payment source

## 2020-03-02 ENCOUNTER — Other Ambulatory Visit: Payer: Self-pay

## 2020-05-16 ENCOUNTER — Other Ambulatory Visit: Payer: Self-pay

## 2020-05-16 ENCOUNTER — Ambulatory Visit: Payer: Self-pay | Attending: Family Medicine | Admitting: Family Medicine

## 2020-05-16 ENCOUNTER — Encounter: Payer: Self-pay | Admitting: Family Medicine

## 2020-05-16 ENCOUNTER — Other Ambulatory Visit: Payer: Self-pay | Admitting: Family Medicine

## 2020-05-16 VITALS — BP 112/78 | HR 90 | Ht 63.0 in

## 2020-05-16 DIAGNOSIS — E7800 Pure hypercholesterolemia, unspecified: Secondary | ICD-10-CM

## 2020-05-16 DIAGNOSIS — I152 Hypertension secondary to endocrine disorders: Secondary | ICD-10-CM

## 2020-05-16 DIAGNOSIS — E1159 Type 2 diabetes mellitus with other circulatory complications: Secondary | ICD-10-CM

## 2020-05-16 DIAGNOSIS — E1169 Type 2 diabetes mellitus with other specified complication: Secondary | ICD-10-CM

## 2020-05-16 DIAGNOSIS — E1149 Type 2 diabetes mellitus with other diabetic neurological complication: Secondary | ICD-10-CM

## 2020-05-16 DIAGNOSIS — M5432 Sciatica, left side: Secondary | ICD-10-CM

## 2020-05-16 LAB — POCT GLYCOSYLATED HEMOGLOBIN (HGB A1C): HbA1c, POC (controlled diabetic range): 6 % (ref 0.0–7.0)

## 2020-05-16 LAB — GLUCOSE, POCT (MANUAL RESULT ENTRY): POC Glucose: 130 mg/dl — AB (ref 70–99)

## 2020-05-16 MED ORDER — GABAPENTIN 300 MG PO CAPS: 600.0000 mg | ORAL_CAPSULE | Freq: Two times a day (BID) | ORAL | 1 refills | Status: DC

## 2020-05-16 MED ORDER — GLIPIZIDE 10 MG PO TABS: 10.0000 mg | ORAL_TABLET | Freq: Two times a day (BID) | ORAL | 1 refills | Status: DC

## 2020-05-16 MED ORDER — DAPAGLIFLOZIN PROPANEDIOL 5 MG PO TABS: 5.0000 mg | ORAL_TABLET | Freq: Every day | ORAL | 6 refills | Status: DC

## 2020-05-16 MED ORDER — SITAGLIPTIN PHOSPHATE 100 MG PO TABS: 100.0000 mg | ORAL_TABLET | Freq: Every day | ORAL | 1 refills | Status: DC

## 2020-05-16 MED ORDER — TIZANIDINE HCL 4 MG PO TABS: 4.0000 mg | ORAL_TABLET | Freq: Three times a day (TID) | ORAL | 1 refills | Status: DC | PRN

## 2020-05-16 MED ORDER — NAPROXEN SODIUM 550 MG PO TABS: 550.0000 mg | ORAL_TABLET | Freq: Two times a day (BID) | ORAL | 1 refills | Status: DC

## 2020-05-16 MED ORDER — LISINOPRIL-HYDROCHLOROTHIAZIDE 10-12.5 MG PO TABS: 1.0000 | ORAL_TABLET | Freq: Every day | ORAL | 1 refills | Status: DC

## 2020-05-16 NOTE — Patient Instructions (Signed)
Sciatica  Sciatica is pain, weakness, tingling, or loss of feeling (numbness) along the sciatic nerve. The sciatic nerve starts in the lower back and goes down the back of each leg. Sciatica usually goes away on its own or with treatment. Sometimes, sciatica may come back (recur). What are the causes? This condition happens when the sciatic nerve is pinched or has pressure put on it. This may be the result of:  A disk in between the bones of the spine bulging out too far (herniated disk).  Changes in the spinal disks that occur with aging.  A condition that affects a muscle in the butt.  Extra bone growth near the sciatic nerve.  A break (fracture) of the area between your hip bones (pelvis).  Pregnancy.  Tumor. This is rare. What increases the risk? You are more likely to develop this condition if you:  Play sports that put pressure or stress on the spine.  Have poor strength and ease of movement (flexibility).  Have had a back injury in the past.  Have had back surgery.  Sit for long periods of time.  Do activities that involve bending or lifting over and over again.  Are very overweight (obese). What are the signs or symptoms? Symptoms can vary from mild to very bad. They may include:  Any of these problems in the lower back, leg, hip, or butt: ? Mild tingling, loss of feeling, or dull aches. ? Burning sensations. ? Sharp pains.  Loss of feeling in the back of the calf or the sole of the foot.  Leg weakness.  Very bad back pain that makes it hard to move. These symptoms may get worse when you cough, sneeze, or laugh. They may also get worse when you sit or stand for long periods of time. How is this treated? This condition often gets better without any treatment. However, treatment may include:  Changing or cutting back on physical activity when you have pain.  Doing exercises and stretching.  Putting ice or heat on the affected area.  Medicines that  help: ? To relieve pain and swelling. ? To relax your muscles.  Shots (injections) of medicines that help to relieve pain, irritation, and swelling.  Surgery. Follow these instructions at home: Medicines  Take over-the-counter and prescription medicines only as told by your doctor.  Ask your doctor if the medicine prescribed to you: ? Requires you to avoid driving or using heavy machinery. ? Can cause trouble pooping (constipation). You may need to take these steps to prevent or treat trouble pooping:  Drink enough fluids to keep your pee (urine) pale yellow.  Take over-the-counter or prescription medicines.  Eat foods that are high in fiber. These include beans, whole grains, and fresh fruits and vegetables.  Limit foods that are high in fat and sugar. These include fried or sweet foods. Managing pain  If told, put ice on the affected area. ? Put ice in a plastic bag. ? Place a towel between your skin and the bag. ? Leave the ice on for 20 minutes, 2-3 times a day.  If told, put heat on the affected area. Use the heat source that your doctor tells you to use, such as a moist heat pack or a heating pad. ? Place a towel between your skin and the heat source. ? Leave the heat on for 20-30 minutes. ? Remove the heat if your skin turns bright red. This is very important if you are unable to feel pain,   heat, or cold. You may have a greater risk of getting burned.      Activity  Return to your normal activities as told by your doctor. Ask your doctor what activities are safe for you.  Avoid activities that make your symptoms worse.  Take short rests during the day. ? When you rest for a long time, do some physical activity or stretching between periods of rest. ? Avoid sitting for a long time without moving. Get up and move around at least one time each hour.  Exercise and stretch regularly, as told by your doctor.  Do not lift anything that is heavier than 10 lb (4.5 kg)  while you have symptoms of sciatica. ? Avoid lifting heavy things even when you do not have symptoms. ? Avoid lifting heavy things over and over.  When you lift objects, always lift in a way that is safe for your body. To do this, you should: ? Bend your knees. ? Keep the object close to your body. ? Avoid twisting.   General instructions  Stay at a healthy weight.  Wear comfortable shoes that support your feet. Avoid wearing high heels.  Avoid sleeping on a mattress that is too soft or too hard. You might have less pain if you sleep on a mattress that is firm enough to support your back.  Keep all follow-up visits as told by your doctor. This is important. Contact a doctor if:  You have pain that: ? Wakes you up when you are sleeping. ? Gets worse when you lie down. ? Is worse than the pain you have had in the past. ? Lasts longer than 4 weeks.  You lose weight without trying. Get help right away if:  You cannot control when you pee (urinate) or poop (have a bowel movement).  You have weakness in any of these areas and it gets worse: ? Lower back. ? The area between your hip bones. ? Butt. ? Legs.  You have redness or swelling of your back.  You have a burning feeling when you pee. Summary  Sciatica is pain, weakness, tingling, or loss of feeling (numbness) along the sciatic nerve.  This condition happens when the sciatic nerve is pinched or has pressure put on it.  Sciatica can cause pain, tingling, or loss of feeling (numbness) in the lower back, legs, hips, and butt.  Treatment often includes rest, exercise, medicines, and putting ice or heat on the affected area. This information is not intended to replace advice given to you by your health care provider. Make sure you discuss any questions you have with your health care provider. Document Revised: 03/30/2018 Document Reviewed: 03/30/2018 Elsevier Patient Education  2021 Elsevier Inc.  

## 2020-05-16 NOTE — Progress Notes (Signed)
Having pain in left buttocks down left leg.

## 2020-05-16 NOTE — Progress Notes (Signed)
Subjective:  Patient ID: Carmen Burns, female    DOB: February 17, 1961  Age: 60 y.o. MRN: [id number]  CC: Diabetes   HPI Carmen Burns is a67 year old female with a history of type 2 diabetes mellitus (A1c6.0), diabetic neuropathy hypertension, hyperlipidemia, GERD who presents today for follow-up visit  Complained of 2 week history of L buttock radiating down LLE of sudden onset. This has made it difficult to walk her usual routine. She has associated tingling in her left leg. Denies falling, loss of sphincteric function.   Complains of Metformin 'being aggressive to her stomach' which she describes as causing reflux. She has been taking one 587m Metformin tab qhs rather than 10033mqhs prescribed. Endorses compliance with her other diabetic medications and her neuropathy is controlled on gabapentin. Compliant with her antihypertensive and her statin.  Past Medical History:  Diagnosis Date  . Anemia 2013  . Diabetes mellitus without complication ([place]  . Hyperlipidemia   . Hypertension   . Refusal of blood transfusions as patient is Jehovah's Witness     Past Surgical History:  Procedure Laterality Date  . APPENDECTOMY    . DILITATION & CURRETTAGE/HYSTROSCOPY WITH NOVASURE ABLATION N/A 01/12/2013   Procedure: DILATATION & CURETTAGE/HYSTEROSCOPY WITH NOVASURE ABLATION;  Surgeon: UgOsborne OmanMD;  [facility];  Service: Gynecology;  Laterality: N/A;    Family History  Problem Relation Age of Onset  . Diabetes Brother   . Hypertension Brother   . Diabetes Brother   . Diabetes Brother   . Diabetes Brother   . Diabetes Brother   . Diabetes Father   . Colon cancer Neg Hx   . Breast cancer Neg Hx   . Rectal cancer Neg Hx   . Stomach cancer Neg Hx   . Esophageal cancer Neg Hx     Allergies  Allergen Reactions  . Penicillins Itching and Rash    Outpatient Medications Prior to Visit  Medication Sig Dispense Refill  . atorvastatin (LIPITOR) 40 MG tablet  Take 1 tablet (40 mg total) by mouth daily. 90 tablet 1  . Blood Glucose Monitoring Suppl (TRUE METRIX METER) W/DEVICE KIT USE TO CHECK BLOOD SUGAR THREE TIMES DAILY AND AT BEDTIME 1 kit 0  . cetirizine (ZYRTEC) 10 MG tablet Take 1 tablet (10 mg total) by mouth daily. 30 tablet 3  . glucose blood test strip USE TO CHECK BLOOD SUGAR THREE TIMES DAILY AND AT BEDTIME 100 each 12  . olopatadine (PATANOL) 0.1 % ophthalmic solution Place 1 drop into both eyes 2 (two) times daily. 5 mL 1  . pantoprazole (PROTONIX) 40 MG tablet TAKE 1 TABLET (40 MG TOTAL) BY MOUTH DAILY. 30 tablet 2  . terbinafine (LAMISIL) 250 MG tablet Take 1 tablet (250 mg total) by mouth daily. 30 tablet 2  . TRUEPLUS LANCETS 28G MISC USE TO CHECK BLOOD SUGAR THREE TIMES DAILY AND AT BEDTIME 100 each 2  . gabapentin (NEURONTIN) 300 MG capsule Take 2 capsules (600 mg total) by mouth 2 (two) times daily. 360 capsule 1  . glipiZIDE (GLUCOTROL) 10 MG tablet Take 1 tablet (10 mg total) by mouth 2 (two) times daily before a meal. 180 tablet 1  . lisinopril-hydrochlorothiazide (ZESTORETIC) 10-12.5 MG tablet Take 1 tablet by mouth daily. 90 tablet 1  . metFORMIN (GLUCOPHAGE-XR) 500 MG 24 hr tablet Take 2 tablets (1,000 mg total) by mouth 2 (two) times daily. 2 tabs daily 360 tablet 1  . naproxen sodium (ANAPROX) 550 MG tablet Take 1 tablet (  550 mg total) by mouth 2 (two) times daily with a meal. 60 tablet 1  . sitaGLIPtin (JANUVIA) 100 MG tablet Take 1 tablet (100 mg total) by mouth daily. 90 tablet 1   No facility-administered medications prior to visit.     ROS Review of Systems  Constitutional: Negative for activity change, appetite change and fatigue.  HENT: Negative for congestion, sinus pressure and sore throat.   Eyes: Negative for visual disturbance.  Respiratory: Negative for cough, chest tightness, shortness of breath and wheezing.   Cardiovascular: Negative for chest pain and palpitations.  Gastrointestinal: Negative for  abdominal distention, abdominal pain and constipation.  Endocrine: Negative for polydipsia.  Genitourinary: Negative for dysuria and frequency.  Musculoskeletal:       See HPI  Skin: Negative for rash.  Neurological: Positive for numbness. Negative for tremors and light-headedness.  Hematological: Does not bruise/bleed easily.  Psychiatric/Behavioral: Negative for agitation and behavioral problems.    Objective:  BP 112/78   Pulse 90   Ht 5' 3"  (1.6 m)   LMP 06/20/2015 Comment: no chance pregnant,husband has had vasectomy pt sates  SpO2 97%   BMI 37.75 kg/m   BP/Weight 05/16/2020 30/86/5784 08/31/6293  Systolic BP 284 132 440  Diastolic BP 78 82 83  Wt. (Lbs) - 213.1 215  BMI 37.75 37.75 38.09      Physical Exam Constitutional:      Appearance: She is well-developed.  Neck:     Vascular: No JVD.  Cardiovascular:     Rate and Rhythm: Normal rate.     Heart sounds: Normal heart sounds. No murmur heard.   Pulmonary:     Effort: Pulmonary effort is normal.     Breath sounds: Normal breath sounds. No wheezing or rales.  Chest:     Chest wall: No tenderness.  Abdominal:     General: Bowel sounds are normal. There is no distension.     Palpations: Abdomen is soft. There is no mass.     Tenderness: There is no abdominal tenderness.  Musculoskeletal:        General: Tenderness (TTP lateral aspect of foot) present. Normal range of motion.     Right lower leg: No edema.     Left lower leg: No edema.     Comments: Positive straight leg raise on the left.   Neurological:     Mental Status: She is alert and oriented to person, place, and time.  Psychiatric:        Mood and Affect: Mood normal.    Diabetic Foot Exam - Simple   Simple Foot Form Diabetic Foot exam was performed with the following findings: Yes 05/16/2020 11:32 AM  Visual Inspection No deformities, no ulcerations, no other skin breakdown bilaterally: Yes Sensation Testing Intact to touch and monofilament  testing bilaterally: Yes Pulse Check Posterior Tibialis and Dorsalis pulse intact bilaterally: Yes Comments     CMP Latest Ref Rng & Units 07/28/2019 01/22/2019 04/13/2018  Glucose 65 - 99 mg/dL 147(H) 121(H) 162(H)  BUN 8 - 27 mg/dL 7(L) 11 12  Creatinine 0.57 - 1.00 mg/dL 0.81 0.78 0.76  Sodium 134 - 144 mmol/L 140 140 137  Potassium 3.5 - 5.2 mmol/L 4.4 4.3 4.4  Chloride 96 - 106 mmol/L 102 103 100  CO2 20 - 29 mmol/L 21 27 23   Calcium 8.7 - 10.3 mg/dL 9.4 9.5 9.7  Total Protein 6.0 - 8.5 g/dL 7.5 - 7.0  Total Bilirubin 0.0 - 1.2 mg/dL 0.4 - 0.3  Alkaline Phos 39 - 117 IU/L 117 - 124(H)  AST 0 - 40 IU/L 28 - 19  ALT 0 - 32 IU/L 26 - 23    Lipid Panel     Component Value Date/Time   CHOL 174 07/28/2019 1246   TRIG 131 07/28/2019 1246   HDL 51 07/28/2019 1246   CHOLHDL 3.4 07/28/2019 1246   CHOLHDL 3.2 01/19/2016 1043   VLDL 31 (H) 01/19/2016 1043   LDLCALC 100 (H) 07/28/2019 1246    CBC    Component Value Date/Time   WBC 8.4 03/22/2015 0918   RBC 4.61 03/22/2015 0918   HGB 13.8 03/22/2015 0918   HCT 41.9 03/22/2015 0918   PLT 286 03/22/2015 0918   MCV 90.9 03/22/2015 0918   MCV 60.0 (A) 04/30/2012 1328   MCH 29.9 03/22/2015 0918   MCHC 32.9 03/22/2015 0918   RDW 13.1 03/22/2015 0918   LYMPHSABS 2.7 03/22/2015 0918   MONOABS 0.6 03/22/2015 0918   EOSABS 0.3 03/22/2015 0918   BASOSABS 0.0 03/22/2015 0918    Lab Results  Component Value Date   HGBA1C 6.0 05/16/2020    Assessment & Plan:  1. Type 2 diabetes mellitus with other specified complication, without long-term current use of insulin (HCC) Controlled with A1c of 6.0 Discontinue Metformin due to reflux Initiate Farxiga Counseled on Diabetic diet, my plate method, 144 minutes of moderate intensity exercise/week Blood sugar logs with fasting goals of 80-120 mg/dl, random of less than 180 and in the event of sugars less than 60 mg/dl or greater than 400 mg/dl encouraged to notify the clinic. Advised on  the need for annual eye exams, annual foot exams, Pneumonia vaccine. - POCT glucose (manual entry) - POCT glycosylated hemoglobin (Hb A1C) - sitaGLIPtin (JANUVIA) 100 MG tablet; Take 1 tablet (100 mg total) by mouth daily.  Dispense: 90 tablet; Refill: 1 - glipiZIDE (GLUCOTROL) 10 MG tablet; Take 1 tablet (10 mg total) by mouth 2 (two) times daily before a meal.  Dispense: 180 tablet; Refill: 1 - dapagliflozin propanediol (FARXIGA) 5 MG TABS tablet; Take 1 tablet (5 mg total) by mouth daily before breakfast.  Dispense: 30 tablet; Refill: 6 - CMP14+EGFR - Lipid panel - Microalbumin / creatinine urine ratio  2. Hypertension associated with diabetes ([place]) Controlled Counseled on blood pressure goal of less than 130/80, low-sodium, DASH diet, medication compliance, 150 minutes of moderate intensity exercise per week. Discussed medication compliance, adverse effects. - lisinopril-hydrochlorothiazide (ZESTORETIC) 10-12.5 MG tablet; Take 1 tablet by mouth daily.  Dispense: 90 tablet; Refill: 1  3. Other diabetic neurological complication associated with type 2 diabetes mellitus (HCC) Stable - gabapentin (NEURONTIN) 300 MG capsule; Take 2 capsules (600 mg total) by mouth 2 (two) times daily.  Dispense: 360 capsule; Refill: 1  4. Pure hypercholesterolemia Controlled Continue Lipitor Low-cholesterol diet  5. Left sided sciatica Demonstrated stretching exercises - naproxen sodium (ANAPROX) 550 MG tablet; Take 1 tablet (550 mg total) by mouth 2 (two) times daily with a meal.  Dispense: 60 tablet; Refill: 1 - Ambulatory referral to Physical Therapy - tiZANidine (ZANAFLEX) 4 MG tablet; Take 1 tablet (4 mg total) by mouth every 8 (eight) hours as needed for muscle spasms.  Dispense: 60 tablet; Refill: 1   Meds ordered this encounter  Medications  . sitaGLIPtin (JANUVIA) 100 MG tablet    Sig: Take 1 tablet (100 mg total) by mouth daily.    Dispense:  90 tablet    Refill:  1  .  lisinopril-hydrochlorothiazide (ZESTORETIC)  10-12.5 MG tablet    Sig: Take 1 tablet by mouth daily.    Dispense:  90 tablet    Refill:  1  . glipiZIDE (GLUCOTROL) 10 MG tablet    Sig: Take 1 tablet (10 mg total) by mouth 2 (two) times daily before a meal.    Dispense:  180 tablet    Refill:  1  . gabapentin (NEURONTIN) 300 MG capsule    Sig: Take 2 capsules (600 mg total) by mouth 2 (two) times daily.    Dispense:  360 capsule    Refill:  1    Discontinue previous dose  . naproxen sodium (ANAPROX) 550 MG tablet    Sig: Take 1 tablet (550 mg total) by mouth 2 (two) times daily with a meal.    Dispense:  60 tablet    Refill:  1  . dapagliflozin propanediol (FARXIGA) 5 MG TABS tablet    Sig: Take 1 tablet (5 mg total) by mouth daily before breakfast.    Dispense:  30 tablet    Refill:  6    Discontinue Metformin  . tiZANidine (ZANAFLEX) 4 MG tablet    Sig: Take 1 tablet (4 mg total) by mouth every 8 (eight) hours as needed for muscle spasms.    Dispense:  60 tablet    Refill:  1    Follow-up: Return in about 1 month (around 06/13/2020) for PAP smear.       Charlott Rakes, MD, FAAFP. [facility] and [place], [place]   05/16/2020, 11:32 AM

## 2020-05-17 ENCOUNTER — Other Ambulatory Visit: Payer: Self-pay | Admitting: Family Medicine

## 2020-05-17 LAB — CMP14+EGFR
ALT: 18 IU/L (ref 0–32)
AST: 23 IU/L (ref 0–40)
Albumin/Globulin Ratio: 1.5 (ref 1.2–2.2)
Albumin: 4.6 g/dL (ref 3.8–4.8)
Alkaline Phosphatase: 113 IU/L (ref 44–121)
BUN/Creatinine Ratio: 11 — ABNORMAL LOW (ref 12–28)
BUN: 9 mg/dL (ref 8–27)
Bilirubin Total: 0.5 mg/dL (ref 0.0–1.2)
CO2: 22 mmol/L (ref 20–29)
Calcium: 9.8 mg/dL (ref 8.7–10.3)
Chloride: 100 mmol/L (ref 96–106)
Creatinine, Ser: 0.8 mg/dL (ref 0.57–1.00)
GFR calc Af Amer: 92 mL/min/{1.73_m2} (ref >59)
GFR calc non Af Amer: 80 mL/min/{1.73_m2} (ref >59)
Globulin, Total: 3.1 g/dL (ref 1.5–4.5)
Glucose: 125 mg/dL — ABNORMAL HIGH (ref 65–99)
Potassium: 5 mmol/L (ref 3.5–5.2)
Sodium: 138 mmol/L (ref 134–144)
Total Protein: 7.7 g/dL (ref 6.0–8.5)

## 2020-05-17 LAB — LIPID PANEL
Chol/HDL Ratio: 2.9 ratio (ref 0.0–4.4)
Cholesterol, Total: 179 mg/dL (ref 100–199)
HDL: 61 mg/dL (ref >39)
LDL Chol Calc (NIH): 96 mg/dL (ref 0–99)
Triglycerides: 123 mg/dL (ref 0–149)
VLDL Cholesterol Cal: 22 mg/dL (ref 5–40)

## 2020-05-17 LAB — MICROALBUMIN / CREATININE URINE RATIO
Creatinine, Urine: 156.8 mg/dL
Microalb/Creat Ratio: 7 mg/g creat (ref 0–29)
Microalbumin, Urine: 10.9 ug/mL

## 2020-05-17 MED ORDER — NAPROXEN 500 MG PO TABS: 500.0000 mg | ORAL_TABLET | Freq: Two times a day (BID) | ORAL | 1 refills | Status: DC

## 2020-05-19 ENCOUNTER — Telehealth: Payer: Self-pay

## 2020-05-19 NOTE — Telephone Encounter (Signed)
-----   Message from Charlott Rakes, MD sent at 05/19/2020  2:35 PM EST ----- Please inform the patient that labs are normal. Thank you.

## 2020-05-19 NOTE — Telephone Encounter (Signed)
Patient name and DOB has been verified Patient was informed of lab results. Patient had no questions.  

## 2020-06-20 ENCOUNTER — Other Ambulatory Visit: Payer: Self-pay

## 2020-06-20 ENCOUNTER — Encounter: Payer: Self-pay | Admitting: Family Medicine

## 2020-06-20 ENCOUNTER — Ambulatory Visit: Payer: Self-pay | Attending: Family Medicine | Admitting: Family Medicine

## 2020-06-20 ENCOUNTER — Other Ambulatory Visit: Payer: Self-pay | Admitting: Family Medicine

## 2020-06-20 VITALS — BP 124/81 | HR 83 | Ht 63.5 in | Wt 215.4 lb

## 2020-06-20 DIAGNOSIS — R208 Other disturbances of skin sensation: Secondary | ICD-10-CM

## 2020-06-20 DIAGNOSIS — Z124 Encounter for screening for malignant neoplasm of cervix: Secondary | ICD-10-CM

## 2020-06-20 DIAGNOSIS — Z0000 Encounter for general adult medical examination without abnormal findings: Secondary | ICD-10-CM

## 2020-06-20 MED ORDER — HYDROCORT-PRAMOXINE (PERIANAL) 2.5-1 % EX CREA: 1.0000 "application " | TOPICAL_CREAM | Freq: Three times a day (TID) | CUTANEOUS | 0 refills | Status: DC

## 2020-06-20 NOTE — Patient Instructions (Signed)
Mantenimiento de Technical sales engineer en [place] Maintenance, Female Adoptar un estilo de vida saludable y recibir atencin preventiva son importantes para promover la salud y Musician. Consulte al mdico sobre:  El esquema adecuado para hacerse pruebas y exmenes peridicos.  Cosas que puede hacer por su cuenta para prevenir enfermedades y [employer]. Qu debo saber sobre la dieta, el peso y el ejercicio? Consuma una dieta saludable  Consuma una dieta que incluya muchas verduras, frutas, productos lcteos con bajo contenido de [place] y Advertising account planner.  No consuma muchos alimentos ricos en grasas slidas, azcares agregados o sodio.   Mantenga un peso saludable El ndice de masa muscular [facility]) se [place] para identificar problemas de [place]. Proporciona una estimacin de la grasa corporal basndose en el peso y la altura. Su mdico puede ayudarle a Radiation protection practitioner [place] y a Scientist, forensic o Theatre manager un peso saludable. Haga ejercicio con regularidad Haga ejercicio con regularidad. Esta es una de las prcticas ms importantes que puede hacer por su salud. La mayora de los adultos deben seguir estas pautas:  Optometrist, al menos, 133minutos de actividad fsica por semana. El ejercicio debe aumentar la frecuencia cardaca y Nature conservation officer transpirar (ejercicio de intensidad moderada).  Hacer ejercicios de fortalecimiento por lo [employer] por semana. Agregue esto a su plan de ejercicio de intensidad moderada.  Pasar menos tiempo sentados. Incluso la actividad fsica ligera puede ser beneficiosa. Controle sus niveles de colesterol y lpidos en la sangre Comience a realizarse anlisis de lpidos y Research officer, trade union en la sangre a los 20aos y luego reptalos cada 5aos. Hgase controlar los niveles de colesterol con mayor frecuencia si:  Sus niveles de lpidos y colesterol son altos.  Es mayor de 40aos.  Presenta un alto riesgo de padecer enfermedades cardacas. Qu debo saber sobre las pruebas de  deteccin del cncer? Segn su historia clnica y sus antecedentes familiares, es posible que deba realizarse pruebas de deteccin del cncer en diferentes edades. Esto puede incluir pruebas de deteccin de lo siguiente:  Cncer de mama.  Cncer de cuello uterino.  Cncer colorrectal.  Cncer de piel.  Cncer de pulmn. Qu debo saber sobre la enfermedad cardaca, la diabetes y la hipertensin arterial? Presin arterial y enfermedad cardaca  La hipertensin arterial causa enfermedades cardacas y [place] el riesgo de accidente cerebrovascular. Es ms probable que esto se manifieste en las personas que tienen lecturas de presin arterial alta, tienen ascendencia africana o tienen sobrepeso.  Hgase controlar la presin arterial: ? Cada 3 a 5 aos si tiene entre 18 y 63 aos. ? Todos los aos si es mayor de [place]. Diabetes Realcese exmenes de deteccin de la diabetes con regularidad. Este anlisis revisa el nivel de azcar en la sangre en [place]. Hgase las pruebas de deteccin:  Cada tresaos despus de los 52aos de edad si tiene un peso normal y un bajo riesgo de padecer diabetes.  Con ms frecuencia y a partir de [place] edad inferior si tiene sobrepeso o un alto riesgo de padecer diabetes. Qu debo saber sobre la prevencin de infecciones? Hepatitis B Si tiene un riesgo ms alto de contraer hepatitis B, debe someterse a un examen de deteccin de este virus. Hable con el mdico para averiguar si tiene riesgo de contraer la infeccin por hepatitis B. Hepatitis C Se recomienda el anlisis a:  [employer] 1947 y 1965.  Todas las personas que tengan un riesgo de haber contrado hepatitis C. Enfermedades de transmisin sexual (ETS)  Hgase  las pruebas de deteccin de ITS, incluidas la gonorrea y la clamidia, si: ? Es sexualmente activa y es menor de [place]. ? Es mayor de 24aos, y Investment banker, operational informa que corre riesgo de tener este tipo de  infecciones. ? La actividad sexual ha cambiado desde que le hicieron la ltima prueba de deteccin y tiene un riesgo mayor de Best boy clamidia o Radio broadcast assistant. Pregntele al mdico si usted tiene riesgo.  Pregntele al mdico si usted tiene un alto riesgo de Museum/gallery curator VIH. El mdico tambin puede recomendarle un medicamento recetado para ayudar a evitar la infeccin por el VIH. Si elige tomar medicamentos para prevenir el VIH, primero debe [employer] de deteccin del VIH. Luego debe hacerse anlisis cada 6meses mientras est tomando los medicamentos. Embarazo  Si est por dejar de Librarian, academic (fase premenopusica) y usted puede quedar [place], busque asesoramiento antes de [place].  Tome de 400 a [id number] (mcg) de cido [employer] si [place].  Pida mtodos de control de la natalidad (anticonceptivos) si desea evitar un embarazo no deseado. Osteoporosis y [place] La osteoporosis es una enfermedad en la que los huesos pierden los minerales y la fuerza por el avance de la edad. El resultado pueden ser fracturas en los [place]. Si tiene 65aos o ms, o si est en riesgo de sufrir osteoporosis y fracturas, pregunte a su mdico si debe:  Hacerse pruebas de deteccin de prdida sea.  Tomar un suplemento de calcio o de vitamina D para reducir el riesgo de fracturas.  Recibir terapia de reemplazo hormonal (TRH) para tratar los sntomas de la menopausia. Siga estas instrucciones en su casa: Estilo de vida  No consuma ningn producto que contenga nicotina o tabaco, como cigarrillos, cigarrillos electrnicos y tabaco de Higher education careers adviser. Si necesita ayuda para dejar de fumar, consulte al mdico.  No consuma drogas.  No comparta agujas.  Solicite ayuda a su mdico si necesita apoyo o informacin para abandonar las drogas. Consumo de alcohol  No beba alcohol si: ? Su mdico le indica no hacerlo. ? Est embarazada, puede estar embarazada o est tratando de quedar  embarazada.  Si bebe alcohol: ? Limite la cantidad que consume de 0 a 1 medida por da. ? Limite la ingesta si est amamantando.  Est atento a la cantidad de alcohol que hay en las bebidas que toma. En los [place], una medida equivale a una botella de cerveza de 12oz (362ml), un vaso de vino de 5oz (175ml) o un vaso de una bebida alcohlica de alta graduacin de 1oz (56ml). Instrucciones generales  Realcese los estudios de rutina de la salud, dentales y de Public librarian.  [place].  Infrmele a su mdico si: ? Se siente deprimida con frecuencia. ? Alguna vez ha sido vctima de [place] o no se siente segura en su casa. Resumen  Adoptar un estilo de vida saludable y recibir atencin preventiva son importantes para promover la salud y Musician.  Siga las instrucciones del mdico acerca de una dieta saludable, el ejercicio y la realizacin de pruebas o exmenes para Engineer, building services.  Siga las instrucciones del mdico con respecto al control del colesterol y la presin arterial. Esta informacin no tiene Marine scientist el consejo del mdico. Asegrese de hacerle al mdico cualquier pregunta que tenga. Document Revised: 04/01/2018 Document Reviewed: 04/01/2018 Elsevier Patient Education  [place].

## 2020-06-20 NOTE — Progress Notes (Addendum)
Subjective:  Patient ID: Carmen Burns, female    DOB: 06-Jun-1960  Age: 60 y.o. MRN: [id number]  CC: Gynecologic Exam   HPI Carmen Burns is a60 year old female with a history of type 2 diabetes mellitus (A1c6.0), diabetic neuropathy hypertension, hyperlipidemia, GERD who presents today for a gynecological exam. Her colonoscopy was in 11/2017 and revealed presence of sessile polyp with repeat recommended in 3 years.  Last mammogram was in 02/2020 and was negative for malignancy. She complains of rectal burning but no itching or pain and has no history of hemorrhoids.  Past Medical History:  Diagnosis Date  . Anemia 2013  . Diabetes mellitus without complication ([place])   . Hyperlipidemia   . Hypertension   . Refusal of blood transfusions as patient is Jehovah's Witness     Past Surgical History:  Procedure Laterality Date  . APPENDECTOMY    . DILITATION & CURRETTAGE/HYSTROSCOPY WITH NOVASURE ABLATION N/A 01/12/2013   Procedure: DILATATION & CURETTAGE/HYSTEROSCOPY WITH NOVASURE ABLATION;  Surgeon: Osborne Oman, MD;  Location: [facility];  Service: Gynecology;  Laterality: N/A;    Family History  Problem Relation Age of Onset  . Diabetes Brother   . Hypertension Brother   . Diabetes Brother   . Diabetes Brother   . Diabetes Brother   . Diabetes Brother   . Diabetes Father   . Colon cancer Neg Hx   . Breast cancer Neg Hx   . Rectal cancer Neg Hx   . Stomach cancer Neg Hx   . Esophageal cancer Neg Hx     Allergies  Allergen Reactions  . Penicillins Itching and Rash    Outpatient Medications Prior to Visit  Medication Sig Dispense Refill  . atorvastatin (LIPITOR) 40 MG tablet Take 1 tablet (40 mg total) by mouth daily. 90 tablet 1  . Blood Glucose Monitoring Suppl (TRUE METRIX METER) W/DEVICE KIT USE TO CHECK BLOOD SUGAR THREE TIMES DAILY AND AT BEDTIME 1 kit 0  . cetirizine (ZYRTEC) 10 MG tablet Take 1 tablet (10 mg total) by mouth daily. 30 tablet 3  .  dapagliflozin propanediol (FARXIGA) 5 MG TABS tablet Take 1 tablet (5 mg total) by mouth daily before breakfast. 30 tablet 6  . gabapentin (NEURONTIN) 300 MG capsule Take 2 capsules (600 mg total) by mouth 2 (two) times daily. 360 capsule 1  . glipiZIDE (GLUCOTROL) 10 MG tablet Take 1 tablet (10 mg total) by mouth 2 (two) times daily before a meal. 180 tablet 1  . glucose blood test strip USE TO CHECK BLOOD SUGAR THREE TIMES DAILY AND AT BEDTIME 100 each 12  . lisinopril-hydrochlorothiazide (ZESTORETIC) 10-12.5 MG tablet Take 1 tablet by mouth daily. 90 tablet 1  . naproxen (NAPROSYN) 500 MG tablet Take 1 tablet (500 mg total) by mouth 2 (two) times daily with a meal. 60 tablet 1  . olopatadine (PATANOL) 0.1 % ophthalmic solution Place 1 drop into both eyes 2 (two) times daily. 5 mL 1  . pantoprazole (PROTONIX) 40 MG tablet TAKE 1 TABLET (40 MG TOTAL) BY MOUTH DAILY. 30 tablet 2  . sitaGLIPtin (JANUVIA) 100 MG tablet Take 1 tablet (100 mg total) by mouth daily. 90 tablet 1  . terbinafine (LAMISIL) 250 MG tablet Take 1 tablet (250 mg total) by mouth daily. 30 tablet 2  . tiZANidine (ZANAFLEX) 4 MG tablet Take 1 tablet (4 mg total) by mouth every 8 (eight) hours as needed for muscle spasms. 60 tablet 1  . TRUEPLUS LANCETS 28G MISC USE TO  CHECK BLOOD SUGAR THREE TIMES DAILY AND AT BEDTIME 100 each 2   No facility-administered medications prior to visit.     ROS Review of Systems  Constitutional: Negative for activity change, appetite change and fatigue.  HENT: Negative for congestion, sinus pressure and sore throat.   Eyes: Negative for visual disturbance.  Respiratory: Negative for cough, chest tightness, shortness of breath and wheezing.   Cardiovascular: Negative for chest pain and palpitations.  Gastrointestinal: Negative for abdominal distention, abdominal pain and constipation.  Endocrine: Negative for polydipsia.  Genitourinary: Negative for dysuria and frequency.  Musculoskeletal:  Negative for arthralgias and back pain.  Skin: Negative for rash.  Neurological: Negative for tremors, light-headedness and numbness.  Hematological: Does not bruise/bleed easily.  Psychiatric/Behavioral: Negative for agitation and behavioral problems.    Objective:  BP 124/81   Pulse 83   Ht 5' 3.5" (1.613 m)   Wt 215 lb 6.4 oz (97.7 kg)   LMP 06/20/2015 Comment: no chance pregnant,husband has had vasectomy pt sates  SpO2 99%   BMI 37.56 kg/m   BP/Weight 06/20/2020 05/16/2020 56/38/9373  Systolic BP 428 768 115  Diastolic BP 81 78 82  Wt. (Lbs) 215.4 - 213.1  BMI 37.56 37.75 37.75      Physical Exam Constitutional:      General: She is not in acute distress.    Appearance: She is well-developed. She is not diaphoretic.  HENT:     Head: Normocephalic.     Right Ear: External ear normal.     Left Ear: External ear normal.     Nose: Nose normal.  Eyes:     Conjunctiva/sclera: Conjunctivae normal.     Pupils: Pupils are equal, round, and reactive to light.  Neck:     Vascular: No JVD.  Cardiovascular:     Rate and Rhythm: Normal rate and regular rhythm.     Heart sounds: Normal heart sounds. No murmur heard. No gallop.   Pulmonary:     Effort: Pulmonary effort is normal. No respiratory distress.     Breath sounds: Normal breath sounds. No wheezing or rales.  Chest:     Chest wall: No tenderness.  Breasts:     Right: No mass.     Left: No mass.    Abdominal:     General: Bowel sounds are normal. There is no distension.     Palpations: Abdomen is soft. There is no mass.     Tenderness: There is no abdominal tenderness.  Genitourinary:    Comments: External genitalia, vagina, cervix-normal Musculoskeletal:        General: No tenderness. Normal range of motion.     Cervical back: Normal range of motion.  Skin:    General: Skin is warm and dry.  Neurological:     Mental Status: She is alert and oriented to person, place, and time.     Deep Tendon Reflexes:  Reflexes are normal and symmetric.     CMP Latest Ref Rng & Units 05/16/2020 07/28/2019 01/22/2019  Glucose 65 - 99 mg/dL 125(H) 147(H) 121(H)  BUN 8 - 27 mg/dL 9 7(L) 11  Creatinine 0.57 - 1.00 mg/dL 0.80 0.81 0.78  Sodium 134 - 144 mmol/L 138 140 140  Potassium 3.5 - 5.2 mmol/L 5.0 4.4 4.3  Chloride 96 - 106 mmol/L 100 102 103  CO2 20 - 29 mmol/L 22 21 27   Calcium 8.7 - 10.3 mg/dL 9.8 9.4 9.5  Total Protein 6.0 - 8.5 g/dL 7.7 7.5 -  Total Bilirubin 0.0 - 1.2 mg/dL 0.5 0.4 -  Alkaline Phos 44 - 121 IU/L 113 117 -  AST 0 - 40 IU/L 23 28 -  ALT 0 - 32 IU/L 18 26 -    Lipid Panel     Component Value Date/Time   CHOL 179 05/16/2020 1151   TRIG 123 05/16/2020 1151   HDL 61 05/16/2020 1151   CHOLHDL 2.9 05/16/2020 1151   CHOLHDL 3.2 01/19/2016 1043   VLDL 31 (H) 01/19/2016 1043   LDLCALC 96 05/16/2020 1151    CBC    Component Value Date/Time   WBC 8.4 03/22/2015 0918   RBC 4.61 03/22/2015 0918   HGB 13.8 03/22/2015 0918   HCT 41.9 03/22/2015 0918   PLT 286 03/22/2015 0918   MCV 90.9 03/22/2015 0918   MCV 60.0 (A) 04/30/2012 1328   MCH 29.9 03/22/2015 0918   MCHC 32.9 03/22/2015 0918   RDW 13.1 03/22/2015 0918   LYMPHSABS 2.7 03/22/2015 0918   MONOABS 0.6 03/22/2015 0918   EOSABS 0.3 03/22/2015 0918   BASOSABS 0.0 03/22/2015 0918    Lab Results  Component Value Date   HGBA1C 6.0 05/16/2020    Assessment & Plan:  1. Annual physical exam Counseled on 150 minutes of exercise per week, healthy eating (including decreased daily intake of saturated fats, cholesterol, added sugars, sodium), routine healthcare maintenance.   2. Screening for cervical cancer - Cytology - PAP([facility])  3. Rectal burning Likely due to hemorrhoids Counseled on prevention of constipation, increasing fiber intake, sitz bath - hydrocortisone-pramoxine (ANALPRAM HC) 2.5-1 % rectal cream; Place 1 application rectally 3 (three) times daily.  Dispense: 30 g; Refill: 0   Colonoscopy due  11/2020 Meds ordered this encounter  Medications  . hydrocortisone-pramoxine (ANALPRAM HC) 2.5-1 % rectal cream    Sig: Place 1 application rectally 3 (three) times daily.    Dispense:  30 g    Refill:  0    Follow-up: Return in about 6 months (around 12/21/2020) for Medical conditions.       Charlott Rakes, MD, FAAFP. [facility] [place], [place]   06/20/2020, 11:37 AM

## 2020-06-22 LAB — CYTOLOGY - PAP
Comment: NEGATIVE
Diagnosis: NEGATIVE
High risk HPV: NEGATIVE

## 2020-06-23 ENCOUNTER — Telehealth: Payer: Self-pay

## 2020-06-23 NOTE — Telephone Encounter (Signed)
Patient name and DOB has been verified Patient was informed of lab results. Patient had no questions.  

## 2020-06-23 NOTE — Telephone Encounter (Signed)
-----   Message from Charlott Rakes, MD sent at 06/23/2020 10:50 AM EDT ----- PAP smear is normal

## 2020-06-24 ENCOUNTER — Other Ambulatory Visit: Payer: Self-pay

## 2020-08-02 ENCOUNTER — Other Ambulatory Visit: Payer: Self-pay

## 2020-08-02 ENCOUNTER — Other Ambulatory Visit: Payer: Self-pay | Admitting: Family Medicine

## 2020-08-02 DIAGNOSIS — K219 Gastro-esophageal reflux disease without esophagitis: Secondary | ICD-10-CM

## 2020-08-02 MED ORDER — PANTOPRAZOLE SODIUM 40 MG PO TBEC
DELAYED_RELEASE_TABLET | Freq: Every day | ORAL | 2 refills | Status: DC
  Filled 2020-08-02: qty 30, 30d supply, fill #0
  Filled 2020-09-06: qty 30, 30d supply, fill #1
  Filled 2020-10-10: qty 30, 30d supply, fill #2

## 2020-08-02 MED FILL — Tizanidine HCl Tab 4 MG (Base Equivalent): ORAL | 20 days supply | Qty: 60 | Fill #0 | Status: AC

## 2020-08-02 MED FILL — Sitagliptin Phosphate Tab 100 MG (Base Equiv): ORAL | 30 days supply | Qty: 30 | Fill #0 | Status: AC

## 2020-08-02 MED FILL — Gabapentin Cap 300 MG: ORAL | 30 days supply | Qty: 120 | Fill #0 | Status: AC

## 2020-08-02 MED FILL — Dapagliflozin Propanediol Tab 5 MG (Base Equivalent): ORAL | 30 days supply | Qty: 30 | Fill #0 | Status: CN

## 2020-08-02 MED FILL — Dapagliflozin Propanediol Tab 5 MG (Base Equivalent): ORAL | 30 days supply | Qty: 30 | Fill #0 | Status: AC

## 2020-08-02 MED FILL — Lisinopril & Hydrochlorothiazide Tab 10-12.5 MG: ORAL | 30 days supply | Qty: 30 | Fill #0 | Status: AC

## 2020-08-02 MED FILL — Atorvastatin Calcium Tab 40 MG (Base Equivalent): ORAL | 30 days supply | Qty: 30 | Fill #0 | Status: AC

## 2020-08-02 MED FILL — Naproxen Tab 500 MG: ORAL | 30 days supply | Qty: 60 | Fill #0 | Status: AC

## 2020-08-02 MED FILL — Glipizide Tab 10 MG: ORAL | 30 days supply | Qty: 60 | Fill #0 | Status: AC

## 2020-08-02 NOTE — Telephone Encounter (Signed)
Requested Prescriptions  Pending Prescriptions Disp Refills  . pantoprazole (PROTONIX) 40 MG tablet 30 tablet 2    Sig: TAKE 1 TABLET (40 MG TOTAL) BY MOUTH DAILY.     Gastroenterology: Proton Pump Inhibitors Passed - 08/02/2020 12:14 PM      Passed - Valid encounter within last 12 months    Recent Outpatient Visits          1 month ago Annual physical exam   [place], Charlane Ferretti, MD   2 months ago Type 2 diabetes mellitus with other specified complication, without long-term current use of insulin ([place])   [place], [place], MD   7 months ago Pure hypercholesterolemia   [place], Charlane Ferretti, MD   1 year ago Type 2 diabetes mellitus with other specified complication, without long-term current use of insulin ([place])   [place], Charlane Ferretti, MD   1 year ago Other diabetic neurological complication associated with type 2 diabetes mellitus [facility])   [facility] Charlott Rakes, MD

## 2020-08-03 ENCOUNTER — Other Ambulatory Visit: Payer: Self-pay

## 2020-09-06 ENCOUNTER — Other Ambulatory Visit: Payer: Self-pay

## 2020-09-06 ENCOUNTER — Other Ambulatory Visit: Payer: Self-pay | Admitting: Family Medicine

## 2020-09-06 MED ORDER — TRUE METRIX BLOOD GLUCOSE TEST VI STRP
ORAL_STRIP | 12 refills | Status: DC
  Filled 2020-09-06: qty 100, 33d supply, fill #0
  Filled 2021-01-25: qty 100, 33d supply, fill #1
  Filled 2021-09-04: qty 100, 25d supply, fill #0

## 2020-09-06 MED FILL — Lisinopril & Hydrochlorothiazide Tab 10-12.5 MG: ORAL | 30 days supply | Qty: 30 | Fill #1 | Status: AC

## 2020-09-06 MED FILL — Sitagliptin Phosphate Tab 100 MG (Base Equiv): ORAL | 30 days supply | Qty: 30 | Fill #1 | Status: AC

## 2020-09-06 MED FILL — Dapagliflozin Propanediol Tab 5 MG (Base Equivalent): ORAL | 30 days supply | Qty: 30 | Fill #1 | Status: AC

## 2020-09-07 ENCOUNTER — Other Ambulatory Visit: Payer: Self-pay

## 2020-09-11 ENCOUNTER — Other Ambulatory Visit: Payer: Self-pay

## 2020-10-10 ENCOUNTER — Other Ambulatory Visit: Payer: Self-pay

## 2020-10-10 MED FILL — Glipizide Tab 10 MG: ORAL | 30 days supply | Qty: 60 | Fill #1 | Status: AC

## 2020-10-10 MED FILL — Dapagliflozin Propanediol Tab 5 MG (Base Equivalent): ORAL | 30 days supply | Qty: 30 | Fill #2 | Status: CN

## 2020-10-10 MED FILL — Sitagliptin Phosphate Tab 100 MG (Base Equiv): ORAL | 30 days supply | Qty: 30 | Fill #2 | Status: AC

## 2020-10-10 MED FILL — Gabapentin Cap 300 MG: ORAL | 30 days supply | Qty: 120 | Fill #1 | Status: AC

## 2020-10-10 MED FILL — Lisinopril & Hydrochlorothiazide Tab 10-12.5 MG: ORAL | 30 days supply | Qty: 30 | Fill #2 | Status: AC

## 2020-10-11 ENCOUNTER — Other Ambulatory Visit: Payer: Self-pay

## 2020-10-18 ENCOUNTER — Other Ambulatory Visit: Payer: Self-pay

## 2020-11-14 ENCOUNTER — Other Ambulatory Visit: Payer: Self-pay

## 2020-11-14 MED FILL — Atorvastatin Calcium Tab 40 MG (Base Equivalent): ORAL | 30 days supply | Qty: 30 | Fill #1 | Status: AC

## 2020-11-14 MED FILL — Sitagliptin Phosphate Tab 100 MG (Base Equiv): ORAL | 30 days supply | Qty: 30 | Fill #3 | Status: AC

## 2020-11-14 MED FILL — Glipizide Tab 10 MG: ORAL | 30 days supply | Qty: 60 | Fill #2 | Status: AC

## 2020-11-14 MED FILL — Lisinopril & Hydrochlorothiazide Tab 10-12.5 MG: ORAL | 30 days supply | Qty: 30 | Fill #3 | Status: AC

## 2020-11-14 MED FILL — Dapagliflozin Propanediol Tab 5 MG (Base Equivalent): ORAL | 30 days supply | Qty: 30 | Fill #2 | Status: AC

## 2020-12-14 ENCOUNTER — Other Ambulatory Visit: Payer: Self-pay

## 2020-12-14 ENCOUNTER — Other Ambulatory Visit: Payer: Self-pay | Admitting: Pharmacist

## 2020-12-14 ENCOUNTER — Other Ambulatory Visit: Payer: Self-pay | Admitting: Family Medicine

## 2020-12-14 DIAGNOSIS — I152 Hypertension secondary to endocrine disorders: Secondary | ICD-10-CM

## 2020-12-14 DIAGNOSIS — E1159 Type 2 diabetes mellitus with other circulatory complications: Secondary | ICD-10-CM

## 2020-12-14 DIAGNOSIS — E1169 Type 2 diabetes mellitus with other specified complication: Secondary | ICD-10-CM

## 2020-12-14 MED ORDER — SITAGLIPTIN PHOSPHATE 100 MG PO TABS
100.0000 mg | ORAL_TABLET | Freq: Every day | ORAL | 0 refills | Status: DC
  Filled 2020-12-14: qty 15, 15d supply, fill #0

## 2020-12-14 MED ORDER — LISINOPRIL-HYDROCHLOROTHIAZIDE 10-12.5 MG PO TABS
1.0000 | ORAL_TABLET | Freq: Every day | ORAL | 0 refills | Status: DC
  Filled 2020-12-14: qty 30, 30d supply, fill #0

## 2020-12-14 MED FILL — Dapagliflozin Propanediol Tab 5 MG (Base Equivalent): ORAL | 30 days supply | Qty: 30 | Fill #3 | Status: AC

## 2020-12-20 ENCOUNTER — Other Ambulatory Visit: Payer: Self-pay

## 2020-12-21 ENCOUNTER — Other Ambulatory Visit: Payer: Self-pay

## 2020-12-21 ENCOUNTER — Ambulatory Visit: Payer: Self-pay | Attending: Family Medicine | Admitting: Family Medicine

## 2020-12-21 ENCOUNTER — Encounter (INDEPENDENT_AMBULATORY_CARE_PROVIDER_SITE_OTHER): Payer: Self-pay

## 2020-12-21 ENCOUNTER — Encounter: Payer: Self-pay | Admitting: Family Medicine

## 2020-12-21 VITALS — BP 122/86 | HR 90 | Resp 16 | Wt 211.2 lb

## 2020-12-21 DIAGNOSIS — I152 Hypertension secondary to endocrine disorders: Secondary | ICD-10-CM

## 2020-12-21 DIAGNOSIS — E7800 Pure hypercholesterolemia, unspecified: Secondary | ICD-10-CM

## 2020-12-21 DIAGNOSIS — Z1211 Encounter for screening for malignant neoplasm of colon: Secondary | ICD-10-CM

## 2020-12-21 DIAGNOSIS — Z23 Encounter for immunization: Secondary | ICD-10-CM

## 2020-12-21 DIAGNOSIS — E1169 Type 2 diabetes mellitus with other specified complication: Secondary | ICD-10-CM

## 2020-12-21 DIAGNOSIS — E1159 Type 2 diabetes mellitus with other circulatory complications: Secondary | ICD-10-CM

## 2020-12-21 LAB — POCT GLYCOSYLATED HEMOGLOBIN (HGB A1C): HbA1c, POC (controlled diabetic range): 7.2 % — AB (ref 0.0–7.0)

## 2020-12-21 LAB — GLUCOSE, POCT (MANUAL RESULT ENTRY): POC Glucose: 142 mg/dl — AB (ref 70–99)

## 2020-12-21 MED ORDER — SITAGLIPTIN PHOSPHATE 100 MG PO TABS
100.0000 mg | ORAL_TABLET | Freq: Every day | ORAL | 6 refills | Status: DC
  Filled 2020-12-21 (×2): qty 30, 30d supply, fill #0
  Filled 2021-01-25: qty 90, 90d supply, fill #1
  Filled 2021-05-02: qty 90, 90d supply, fill #0

## 2020-12-21 MED ORDER — LISINOPRIL-HYDROCHLOROTHIAZIDE 10-12.5 MG PO TABS
1.0000 | ORAL_TABLET | Freq: Every day | ORAL | 6 refills | Status: DC
  Filled 2020-12-21 – 2021-01-25 (×2): qty 30, 30d supply, fill #0
  Filled 2021-03-01: qty 30, 30d supply, fill #1
  Filled 2021-04-09: qty 30, 30d supply, fill #0
  Filled 2021-05-02: qty 30, 30d supply, fill #1
  Filled 2021-07-05: qty 30, 30d supply, fill #2
  Filled 2021-08-03: qty 30, 30d supply, fill #3
  Filled 2021-09-04: qty 30, 30d supply, fill #4

## 2020-12-21 MED ORDER — GLIPIZIDE 10 MG PO TABS
ORAL_TABLET | Freq: Two times a day (BID) | ORAL | 1 refills | Status: DC
  Filled 2020-12-21: qty 60, 30d supply, fill #0
  Filled 2021-03-01: qty 60, 30d supply, fill #1
  Filled 2021-04-09: qty 60, 30d supply, fill #0
  Filled 2021-05-02: qty 60, 30d supply, fill #1

## 2020-12-21 MED ORDER — ATORVASTATIN CALCIUM 40 MG PO TABS
ORAL_TABLET | Freq: Every day | ORAL | 1 refills | Status: DC
  Filled 2020-12-21: qty 30, 30d supply, fill #0
  Filled 2021-03-01: qty 30, 30d supply, fill #1

## 2020-12-21 NOTE — Patient Instructions (Addendum)
Hacer ejercicio para mantenerse sano Exercising to Stay Healthy Para estar sano y mantenerse as, es recomendable hacer ejercicio de intensidad moderada y de intensidad vigorosa. Puede saber si est haciendo ejercicio de intensidad moderada si su corazn comienza a latir ms rpido y su respiracin se vuelve ms rpida, pero an puede mantener una conversacin. Puede saber que est haciendo ejercicio de intensidad vigorosa si respira con mucha ms dificultad y rapidez, y no puede mantener una conversacin. Cmo puede beneficiarme el ejercicio? Hacer actividad fsica con regularidad es muy importante. Tiene muchos otros beneficios, como por ejemplo: Mejora el estado fsico general, la flexibilidad y la resistencia. Aumenta la densidad sea. Ayuda a controlar el peso. Disminuye la grasa corporal. Aumenta la fuerza y la resistencia muscular. Reduce el estrs y la tensin, la ansiedad, la depresin o la ira. Mejora el estado de salud general. Qu pautas debo seguir mientras hago ejercicio? Hable con el mdico antes de comenzar un programa nuevo de actividad fsica. No haga ejercicio en exceso que pudiera hacer que se lastime, se sienta mareado o tenga dificultad para respirar. Use ropa cmoda y calzado con buen soporte. Beba gran cantidad de agua mientras hace ejercicio para evitar la deshidratacin o los golpes de calor. Haga ejercicio hasta que se aceleren su respiracin y sus latidos cardacos (intensidad moderada). Con qu frecuencia debera hacer ejercicio? Elija una actividad que disfrute y establezca objetivos realistas. El mdico puede ayudarlo a elaborar un plan de actividades, diseado de manera individual y que funcione mejor para usted. Haga actividad fsica habitualmente como se lo haya indicado el mdico. Esto puede incluir: Hacer ejercicios de fortalecimiento muscular dos veces a la semana, como: Levantamiento de pesas. Uso de bandas elsticas de resistencia. Flexiones de  brazos. Abdominales. Yoga. Realizar ejercicio de una cierta intensidad durante una cantidad determinada de tiempo. Elija entre estas opciones: Un total de 150 minutos de ejercicio de intensidad moderada cada semana. Un total de 75 minutos de ejercicio de intensidad vigorosa cada semana. Una mezcla de ejercicio de intensidad moderada y vigorosa cada semana. Los nios, las mujeres embarazadas, las personas que no han hecho actividad fsica con regularidad, las personas que tienen sobrepeso y los adultos mayores tal vez tengan que consultar a un mdico sobre qu actividades son seguras para realizar. Si tiene alguna afeccin, asegrese de consultar al mdico antes de comenzar un programa de ejercicios nuevo. Cules son algunas ideas para hacer ejercicio? Algunas ideas de ejercicio de intensidad moderada incluyen: Caminar 1 milla (1.6 km) en aproximadamente 15 minutos. Andar en bicicleta. Practicar senderismo. Jugar al golf. Bailar. Gimnasia acutica. Algunas ideas de ejercicio de intensidad vigorosa incluyen: Caminar 4.5 millas (7.2 km) o ms en aproximadamente una hora. Trotar o correr 5 millas (8 km) en aproximadamente una hora. Andar en bicicleta 10 millas (16.1 km) o ms en aproximadamente una hora. Practicar natacin. Practicar patinaje de ruedas o en lnea. Hacer esqu de fondo. Hacer deportes competitivos vigorosos, como ftbol americano, bsquet y ftbol. Saltar la cuerda. Tomar clases de baile aerbico. Cules son algunas actividades diarias que pueden ayudarme a hacer ejercicio? Trabajar en el jardn, como: Empujar una cortadora de csped. Juntar y embolsar hojas. Lavar el automvil. Empujar un cochecito. Palear nieve. Cuidar el jardn. Lavar las ventanas o los pisos. Cmo puedo ser ms activo en mis actividades diarias? Utilice las escaleras en lugar del ascensor. Vaya a caminar durante su hora de almuerzo. Si conduce, estacione el automvil ms lejos del trabajo o de  la escuela. Si usa transporte   pblico, bjese una parada antes y camine el resto del camino. Pngase de pie o camine durante todas las llamadas telefnicas que haga mientras est adentro. Levntese, estrese y camine cada 30 minutos a lo largo del Training and development officer. Haga ejercicio con Gaffer. El apoyo para continuar haciendo ejercicio lo ayudar a Theatre manager una rutina de actividad frecuente. Dnde obtener ms informacin Puede obtener ms informacin acerca de cmo hacer actividad fsica para mantenerse sano en: U.[place]. Department of Health and [employer] ([place]): BondedCompany.at Centers for Disease Control and Prevention Librarian, academic) (Centros para Building surveyor y la Prevencin de Arboriculturist): http://www.wolf.info/ Resumen Hacer actividad fsica con regularidad es muy importante. Mejorar el estado fsico general, la flexibilidad y la resistencia. El ejercicio regular tambin mejorar la salud general. Merilynn [place] a controlar su peso, reducir el estrs y Teacher, English as a foreign language la densidad sea. No haga ejercicio en exceso que pudiera hacer que se lastime, se sienta mareado o tenga dificultad para respirar. Hable con el mdico antes de comenzar un programa nuevo de [place] fsica. Esta informacin no tiene Marine scientist el consejo del mdico. Asegrese de hacerle al mdico cualquier pregunta que tenga. Document Revised: 07/20/2020 Document Reviewed: 07/20/2020 Elsevier Patient Education  2022 [employer].

## 2020-12-21 NOTE — Progress Notes (Signed)
Subjective:  Patient ID: Carmen Burns, female    DOB: 1960-09-23  Age: 60 y.o. MRN: [id number]  CC: Diabetes   HPI Carmen Burns is a 60 y.o. year old female with a history of type 2 diabetes mellitus (A1c 7.2), diabetic neuropathy hypertension, hyperlipidemia, GERD   Interval History: A1c is 7.2 up from 6.0. Her Metformin was discontinued in 04/2020 due to GI side effects and she is currently on Farxiga, Glipizide and Januvia. Endorses compliance with her medications and has no hypoglycemic or visual symptoms.  Neuropathy is controlled on gabapentin. She does well on her antihypertensive and statin with no adverse effects from her medications. Reflux symptoms are controlled on Protonix as needed. With regards to exercise she has slacked a bit since she moved to a new neighborhood but plans to resume a regular walking regimen. Denies additional concerns today.  Past Medical History:  Diagnosis Date   Anemia 2013   Diabetes mellitus without complication ([place])    Hyperlipidemia    Hypertension    Refusal of blood transfusions as patient is Jehovah's Witness     Past Surgical History:  Procedure Laterality Date   APPENDECTOMY     DILITATION & CURRETTAGE/HYSTROSCOPY WITH NOVASURE ABLATION N/A 01/12/2013   Procedure: DILATATION & CURETTAGE/HYSTEROSCOPY WITH NOVASURE ABLATION;  Surgeon: Osborne Oman, MD;  Location: [facility];  Service: Gynecology;  Laterality: N/A;    Family History  Problem Relation Age of Onset   Diabetes Brother    Hypertension Brother    Diabetes Brother    Diabetes Brother    Diabetes Brother    Diabetes Brother    Diabetes Father    Colon cancer Neg Hx    Breast cancer Neg Hx    Rectal cancer Neg Hx    Stomach cancer Neg Hx    Esophageal cancer Neg Hx     Allergies  Allergen Reactions   Penicillins Itching and Rash    Outpatient Medications Prior to Visit  Medication Sig Dispense Refill   Blood Glucose Monitoring Suppl (TRUE METRIX  METER) W/DEVICE KIT USE TO CHECK BLOOD SUGAR THREE TIMES DAILY AND AT BEDTIME 1 kit 0   cetirizine (ZYRTEC) 10 MG tablet Take 1 tablet (10 mg total) by mouth daily. 30 tablet 3   dapagliflozin propanediol (FARXIGA) 5 MG TABS tablet TAKE 1 TABLET (5 MG TOTAL) BY MOUTH DAILY BEFORE BREAKFAST. 30 tablet 6   gabapentin (NEURONTIN) 300 MG capsule TAKE 2 CAPSULES (600 MG TOTAL) BY MOUTH 2 (TWO) TIMES DAILY. 360 capsule 1   glucose blood (TRUE METRIX BLOOD GLUCOSE TEST) test strip USE TO CHECK BLOOD SUGAR THREE TIMES DAILY AND AT BEDTIME 100 each 12   hydrocortisone-pramoxine (ANALPRAM-HC) 2.5-1 % rectal cream PLACE 1 APPLICATION RECTALLY 3 (THREE) TIMES DAILY. 30 g 0   naproxen (NAPROSYN) 500 MG tablet TAKE 1 TABLET (500 MG TOTAL) BY MOUTH 2 (TWO) TIMES DAILY WITH A MEAL. 60 tablet 1   olopatadine (PATANOL) 0.1 % ophthalmic solution Place 1 drop into both eyes 2 (two) times daily. 5 mL 1   pantoprazole (PROTONIX) 40 MG tablet TAKE 1 TABLET (40 MG TOTAL) BY MOUTH DAILY. 30 tablet 2   tiZANidine (ZANAFLEX) 4 MG tablet TAKE 1 TABLET (4 MG TOTAL) BY MOUTH EVERY 8 (EIGHT) HOURS AS NEEDED FOR MUSCLE SPASMS. 60 tablet 1   TRUEPLUS LANCETS 28G MISC USE TO CHECK BLOOD SUGAR THREE TIMES DAILY AND AT BEDTIME 100 each 2   glipiZIDE (GLUCOTROL) 10 MG tablet TAKE 1 TABLET (  10 MG TOTAL) BY MOUTH 2 (TWO) TIMES DAILY BEFORE A MEAL. 180 tablet 1   lisinopril-hydrochlorothiazide (ZESTORETIC) 10-12.5 MG tablet TAKE 1 TABLET BY MOUTH DAILY. 30 tablet 0   sitaGLIPtin (JANUVIA) 100 MG tablet Take 1 tablet (100 mg total) by mouth daily. 30 tablet 0   terbinafine (LAMISIL) 250 MG tablet Take 1 tablet (250 mg total) by mouth daily. 30 tablet 2   atorvastatin (LIPITOR) 40 MG tablet TAKE 1 TABLET (40 MG TOTAL) BY MOUTH DAILY. 90 tablet 1   No facility-administered medications prior to visit.     ROS Review of Systems  Constitutional:  Negative for activity change, appetite change and fatigue.  HENT:  Negative for congestion,  sinus pressure and sore throat.   Eyes:  Negative for visual disturbance.  Respiratory:  Negative for cough, chest tightness, shortness of breath and wheezing.   Cardiovascular:  Negative for chest pain and palpitations.  Gastrointestinal:  Negative for abdominal distention, abdominal pain and constipation.  Endocrine: Negative for polydipsia.  Genitourinary:  Negative for dysuria and frequency.  Musculoskeletal:  Negative for arthralgias and back pain.  Skin:  Negative for rash.  Neurological:  Negative for tremors, light-headedness and numbness.  Hematological:  Does not bruise/bleed easily.  Psychiatric/Behavioral:  Negative for agitation and behavioral problems.    Objective:  BP 122/86   Pulse 90   Resp 16   Wt 211 lb 3.2 oz (95.8 kg)   LMP 06/20/2015 Comment: no chance pregnant,husband has had vasectomy pt sates  SpO2 100%   BMI 36.83 kg/m   BP/Weight 12/21/2020 06/20/2020 9/92/4268  Systolic BP 341 962 229  Diastolic BP 86 81 78  Wt. (Lbs) 211.2 215.4 -  BMI 36.83 37.56 37.75      Physical Exam Constitutional:      Appearance: She is well-developed.  Cardiovascular:     Rate and Rhythm: Normal rate.     Heart sounds: Normal heart sounds. No murmur heard. Pulmonary:     Effort: Pulmonary effort is normal.     Breath sounds: Normal breath sounds. No wheezing or rales.  Chest:     Chest wall: No tenderness.  Abdominal:     General: Bowel sounds are normal. There is no distension.     Palpations: Abdomen is soft. There is no mass.     Tenderness: There is no abdominal tenderness.  Musculoskeletal:        General: Normal range of motion.     Right lower leg: No edema.     Left lower leg: No edema.  Neurological:     Mental Status: She is alert and oriented to person, place, and time.  Psychiatric:        Mood and Affect: Mood normal.    CMP Latest Ref Rng & Units 05/16/2020 07/28/2019 01/22/2019  Glucose 65 - 99 mg/dL 125(H) 147(H) 121(H)  BUN 8 - 27 mg/dL 9  7(L) 11  Creatinine 0.57 - 1.00 mg/dL 0.80 0.81 0.78  Sodium 134 - 144 mmol/L 138 140 140  Potassium 3.5 - 5.2 mmol/L 5.0 4.4 4.3  Chloride 96 - 106 mmol/L 100 102 103  CO2 20 - 29 mmol/L 22 21 27   Calcium 8.7 - 10.3 mg/dL 9.8 9.4 9.5  Total Protein 6.0 - 8.5 g/dL 7.7 7.5 -  Total Bilirubin 0.0 - 1.2 mg/dL 0.5 0.4 -  Alkaline Phos 44 - 121 IU/L 113 117 -  AST 0 - 40 IU/L 23 28 -  ALT 0 - 32 IU/L  18 26 -    Lipid Panel     Component Value Date/Time   CHOL 179 05/16/2020 1151   TRIG 123 05/16/2020 1151   HDL 61 05/16/2020 1151   CHOLHDL 2.9 05/16/2020 1151   CHOLHDL 3.2 01/19/2016 1043   VLDL 31 (H) 01/19/2016 1043   LDLCALC 96 05/16/2020 1151    CBC    Component Value Date/Time   WBC 8.4 03/22/2015 0918   RBC 4.61 03/22/2015 0918   HGB 13.8 03/22/2015 0918   HCT 41.9 03/22/2015 0918   PLT 286 03/22/2015 0918   MCV 90.9 03/22/2015 0918   MCV 60.0 (A) 04/30/2012 1328   MCH 29.9 03/22/2015 0918   MCHC 32.9 03/22/2015 0918   RDW 13.1 03/22/2015 0918   LYMPHSABS 2.7 03/22/2015 0918   MONOABS 0.6 03/22/2015 0918   EOSABS 0.3 03/22/2015 0918   BASOSABS 0.0 03/22/2015 0918    Lab Results  Component Value Date   HGBA1C 7.2 (A) 12/21/2020    Assessment & Plan:  1. Type 2 diabetes mellitus with other specified complication, without long-term current use of insulin (HCC) Controlled with A1c of 7.2; goal is less than 7.0.  A1c has trended up from 6.0 previously If A1c continues to trend up at next visit consider increasing dose of Farxiga Counseled on Diabetic diet, my plate method, 629 minutes of moderate intensity exercise/week Blood sugar logs with fasting goals of 80-120 mg/dl, random of less than 180 and in the event of sugars less than 60 mg/dl or greater than 400 mg/dl encouraged to notify the clinic. Advised on the need for annual eye exams, annual foot exams, Pneumonia vaccine. - POCT glucose (manual entry) - POCT glycosylated hemoglobin (Hb A1C) - glipiZIDE  (GLUCOTROL) 10 MG tablet; TAKE 1 TABLET (10 MG TOTAL) BY MOUTH 2 (TWO) TIMES DAILY BEFORE A MEAL.  Dispense: 180 tablet; Refill: 1 - CMP14+EGFR - Lipid panel - sitaGLIPtin (JANUVIA) 100 MG tablet; Take 1 tablet (100 mg total) by mouth daily.  Dispense: 30 tablet; Refill: 6  2. Need for immunization against influenza - Flu Vaccine QUAD 19moIM (Fluarix, Fluzone & Alfiuria Quad PF)  3. Screening for colon cancer - Fecal occult blood, imunochemical([facility])  4. Pure hypercholesterolemia Controlled Low-cholesterol diet - atorvastatin (LIPITOR) 40 MG tablet; TAKE 1 TABLET (40 MG TOTAL) BY MOUTH DAILY.  Dispense: 90 tablet; Refill: 1  5. Hypertension associated with diabetes ([place] Controlled Counseled on blood pressure goal of less than 130/80, low-sodium, DASH diet, medication compliance, 150 minutes of moderate intensity exercise per week. Discussed medication compliance, adverse effects. - lisinopril-hydrochlorothiazide (ZESTORETIC) 10-12.5 MG tablet; TAKE 1 TABLET BY MOUTH DAILY.  Dispense: 30 tablet; Refill: 6  Meds ordered this encounter  Medications   atorvastatin (LIPITOR) 40 MG tablet    Sig: TAKE 1 TABLET (40 MG TOTAL) BY MOUTH DAILY.    Dispense:  90 tablet    Refill:  1   glipiZIDE (GLUCOTROL) 10 MG tablet    Sig: TAKE 1 TABLET (10 MG TOTAL) BY MOUTH 2 (TWO) TIMES DAILY BEFORE A MEAL.    Dispense:  180 tablet    Refill:  1   lisinopril-hydrochlorothiazide (ZESTORETIC) 10-12.5 MG tablet    Sig: TAKE 1 TABLET BY MOUTH DAILY.    Dispense:  30 tablet    Refill:  6   sitaGLIPtin (JANUVIA) 100 MG tablet    Sig: Take 1 tablet (100 mg total) by mouth daily.    Dispense:  30 tablet    Refill:  6  Follow-up: Return in about 3 months (around 03/22/2021) for Medical condition.       Charlott Rakes, MD, FAAFP. [facility] and [facility] [place], [address]   12/21/2020, 12:05 PM

## 2020-12-22 ENCOUNTER — Telehealth: Payer: Self-pay

## 2020-12-22 LAB — LIPID PANEL
Chol/HDL Ratio: 3 ratio (ref 0.0–4.4)
Cholesterol, Total: 192 mg/dL (ref 100–199)
HDL: 65 mg/dL (ref >39)
LDL Chol Calc (NIH): 106 mg/dL — ABNORMAL HIGH (ref 0–99)
Triglycerides: 119 mg/dL (ref 0–149)
VLDL Cholesterol Cal: 21 mg/dL (ref 5–40)

## 2020-12-22 LAB — CMP14+EGFR
ALT: 18 IU/L (ref 0–32)
AST: 21 IU/L (ref 0–40)
Albumin/Globulin Ratio: 1.4 (ref 1.2–2.2)
Albumin: 4.6 g/dL (ref 3.8–4.8)
Alkaline Phosphatase: 110 IU/L (ref 44–121)
BUN/Creatinine Ratio: 15 (ref 12–28)
BUN: 14 mg/dL (ref 8–27)
Bilirubin Total: 0.5 mg/dL (ref 0.0–1.2)
CO2: 23 mmol/L (ref 20–29)
Calcium: 9.5 mg/dL (ref 8.7–10.3)
Chloride: 103 mmol/L (ref 96–106)
Creatinine, Ser: 0.92 mg/dL (ref 0.57–1.00)
Globulin, Total: 3.2 g/dL (ref 1.5–4.5)
Glucose: 123 mg/dL — ABNORMAL HIGH (ref 70–99)
Potassium: 4.7 mmol/L (ref 3.5–5.2)
Sodium: 141 mmol/L (ref 134–144)
Total Protein: 7.8 g/dL (ref 6.0–8.5)
eGFR: 71 mL/min/{1.73_m2} (ref >59)

## 2020-12-22 NOTE — Telephone Encounter (Signed)
-----   Message from Charlott Rakes, MD sent at 12/22/2020  9:33 AM EDT ----- Please inform the patient that labs are stable.

## 2020-12-22 NOTE — Telephone Encounter (Signed)
Patient name and DOB has been verified Patient was informed of lab results. Patient had no questions.   CRM created and note has been routed.

## 2021-01-09 ENCOUNTER — Encounter: Payer: Self-pay | Admitting: Gastroenterology

## 2021-01-12 ENCOUNTER — Other Ambulatory Visit: Payer: Self-pay

## 2021-01-19 ENCOUNTER — Other Ambulatory Visit: Payer: Self-pay

## 2021-01-24 ENCOUNTER — Other Ambulatory Visit: Payer: Self-pay

## 2021-01-25 ENCOUNTER — Other Ambulatory Visit: Payer: Self-pay

## 2021-01-25 MED FILL — Dapagliflozin Propanediol Tab 5 MG (Base Equivalent): ORAL | 30 days supply | Qty: 30 | Fill #4 | Status: AC

## 2021-02-02 ENCOUNTER — Other Ambulatory Visit: Payer: Self-pay

## 2021-02-02 DIAGNOSIS — Z1231 Encounter for screening mammogram for malignant neoplasm of breast: Secondary | ICD-10-CM

## 2021-03-01 ENCOUNTER — Other Ambulatory Visit: Payer: Self-pay

## 2021-03-01 ENCOUNTER — Encounter: Payer: Self-pay | Admitting: Family Medicine

## 2021-03-01 ENCOUNTER — Ambulatory Visit: Payer: Self-pay | Attending: Family Medicine | Admitting: Family Medicine

## 2021-03-01 VITALS — BP 121/78 | HR 88 | Ht 63.0 in | Wt 212.8 lb

## 2021-03-01 DIAGNOSIS — E1149 Type 2 diabetes mellitus with other diabetic neurological complication: Secondary | ICD-10-CM

## 2021-03-01 DIAGNOSIS — E1169 Type 2 diabetes mellitus with other specified complication: Secondary | ICD-10-CM

## 2021-03-01 DIAGNOSIS — I152 Hypertension secondary to endocrine disorders: Secondary | ICD-10-CM

## 2021-03-01 DIAGNOSIS — K219 Gastro-esophageal reflux disease without esophagitis: Secondary | ICD-10-CM

## 2021-03-01 DIAGNOSIS — E785 Hyperlipidemia, unspecified: Secondary | ICD-10-CM

## 2021-03-01 DIAGNOSIS — E1159 Type 2 diabetes mellitus with other circulatory complications: Secondary | ICD-10-CM

## 2021-03-01 LAB — POCT GLYCOSYLATED HEMOGLOBIN (HGB A1C): HbA1c, POC (controlled diabetic range): 7.5 % — AB (ref 0.0–7.0)

## 2021-03-01 LAB — GLUCOSE, POCT (MANUAL RESULT ENTRY): POC Glucose: 160 mg/dl — AB (ref 70–99)

## 2021-03-01 MED ORDER — DAPAGLIFLOZIN PROPANEDIOL 5 MG PO TABS
ORAL_TABLET | Freq: Every day | ORAL | 6 refills | Status: DC
  Filled 2021-03-01: qty 30, 30d supply, fill #0
  Filled 2021-03-30: qty 30, 30d supply, fill #1
  Filled 2021-03-30: qty 30, 30d supply, fill #0

## 2021-03-01 MED ORDER — PANTOPRAZOLE SODIUM 40 MG PO TBEC
DELAYED_RELEASE_TABLET | Freq: Every day | ORAL | 6 refills | Status: DC
  Filled 2021-03-01 – 2021-04-09 (×2): qty 30, 30d supply, fill #0
  Filled 2022-01-04: qty 30, 30d supply, fill #1

## 2021-03-01 MED ORDER — GABAPENTIN 300 MG PO CAPS
ORAL_CAPSULE | ORAL | 6 refills | Status: DC
  Filled 2021-03-01: qty 150, 30d supply, fill #0
  Filled 2021-03-01: qty 150, fill #0

## 2021-03-01 MED FILL — Gabapentin Cap 300 MG: ORAL | 30 days supply | Qty: 120 | Fill #2 | Status: CN

## 2021-03-01 NOTE — Progress Notes (Signed)
Subjective:  Patient ID: Carmen Burns, female    DOB: August 31, 1960  Age: 60 y.o. MRN: [id number]  CC: Diabetes   HPI Carmen Burns is a 60 y.o. year old female with a history of  type 2 diabetes mellitus (A1c 7.5), diabetic neuropathy hypertension, hyperlipidemia, GERD   Interval History: She complains of moderate burning in the soles of her feet x1 month which is intermittent. Her chart reveals she is on Gabapentin which she endorses adhering to. A1c is 7.5 up from 7.2 previously and she has not changed her lifestyle but does not exercise much.  Denies hypoglycemic symptoms, visual symptoms and is doing well on her medications. Also tolerating her statin and antihypertensive with no adverse effects.  She is due for a repeat Colonoscopy but when she called [facility] they informed her they did not offer financial assistance so she is yet to have a colorectal cancer screening. Past Medical History:  Diagnosis Date   Anemia 2013   Diabetes mellitus without complication ([place])    Hyperlipidemia    Hypertension    Refusal of blood transfusions as patient is Jehovah's Witness     Past Surgical History:  Procedure Laterality Date   APPENDECTOMY     DILITATION & CURRETTAGE/HYSTROSCOPY WITH NOVASURE ABLATION N/A 01/12/2013   Procedure: DILATATION & CURETTAGE/HYSTEROSCOPY WITH NOVASURE ABLATION;  Surgeon: Osborne Oman, MD;  Location: [facility];  Service: Gynecology;  Laterality: N/A;    Family History  Problem Relation Age of Onset   Diabetes Brother    Hypertension Brother    Diabetes Brother    Diabetes Brother    Diabetes Brother    Diabetes Brother    Diabetes Father    Colon cancer Neg Hx    Breast cancer Neg Hx    Rectal cancer Neg Hx    Stomach cancer Neg Hx    Esophageal cancer Neg Hx     Allergies  Allergen Reactions   Penicillins Itching and Rash    Outpatient Medications Prior to Visit  Medication Sig Dispense Refill   atorvastatin (LIPITOR) 40 MG tablet  TAKE 1 TABLET (40 MG TOTAL) BY MOUTH DAILY. 90 tablet 1   Blood Glucose Monitoring Suppl (TRUE METRIX METER) W/DEVICE KIT USE TO CHECK BLOOD SUGAR THREE TIMES DAILY AND AT BEDTIME 1 kit 0   cetirizine (ZYRTEC) 10 MG tablet Take 1 tablet (10 mg total) by mouth daily. 30 tablet 3   glipiZIDE (GLUCOTROL) 10 MG tablet TAKE 1 TABLET (10 MG TOTAL) BY MOUTH 2 (TWO) TIMES DAILY BEFORE A MEAL. 180 tablet 1   glucose blood (TRUE METRIX BLOOD GLUCOSE TEST) test strip USE TO CHECK BLOOD SUGAR THREE TIMES DAILY AND AT BEDTIME 100 each 12   hydrocortisone-pramoxine (ANALPRAM-HC) 2.5-1 % rectal cream PLACE 1 APPLICATION RECTALLY 3 (THREE) TIMES DAILY. 30 g 0   naproxen (NAPROSYN) 500 MG tablet TAKE 1 TABLET (500 MG TOTAL) BY MOUTH 2 (TWO) TIMES DAILY WITH A MEAL. 60 tablet 1   olopatadine (PATANOL) 0.1 % ophthalmic solution Place 1 drop into both eyes 2 (two) times daily. 5 mL 1   sitaGLIPtin (JANUVIA) 100 MG tablet Take 1 tablet (100 mg total) by mouth daily. 30 tablet 6   tiZANidine (ZANAFLEX) 4 MG tablet TAKE 1 TABLET (4 MG TOTAL) BY MOUTH EVERY 8 (EIGHT) HOURS AS NEEDED FOR MUSCLE SPASMS. 60 tablet 1   TRUEPLUS LANCETS 28G MISC USE TO CHECK BLOOD SUGAR THREE TIMES DAILY AND AT BEDTIME 100 each 2   dapagliflozin propanediol (  FARXIGA) 5 MG TABS tablet TAKE 1 TABLET (5 MG TOTAL) BY MOUTH DAILY BEFORE BREAKFAST. 30 tablet 6   gabapentin (NEURONTIN) 300 MG capsule TAKE 2 CAPSULES (600 MG TOTAL) BY MOUTH 2 (TWO) TIMES DAILY. 360 capsule 1   pantoprazole (PROTONIX) 40 MG tablet TAKE 1 TABLET (40 MG TOTAL) BY MOUTH DAILY. 30 tablet 2   lisinopril-hydrochlorothiazide (ZESTORETIC) 10-12.5 MG tablet TAKE 1 TABLET BY MOUTH DAILY. 30 tablet 6   No facility-administered medications prior to visit.     ROS Review of Systems  Constitutional:  Negative for activity change, appetite change and fatigue.  HENT:  Negative for congestion, sinus pressure and sore throat.   Eyes:  Negative for visual disturbance.   Respiratory:  Negative for cough, chest tightness, shortness of breath and wheezing.   Cardiovascular:  Negative for chest pain and palpitations.  Gastrointestinal:  Negative for abdominal distention, abdominal pain and constipation.  Endocrine: Negative for polydipsia.  Genitourinary:  Negative for dysuria and frequency.  Musculoskeletal:  Negative for arthralgias and back pain.  Skin:  Negative for rash.  Neurological:  Positive for numbness. Negative for tremors and light-headedness.  Hematological:  Does not bruise/bleed easily.  Psychiatric/Behavioral:  Negative for agitation and behavioral problems.    Objective:  BP 121/78   Pulse 88   Ht 5' 3"  (1.6 m)   Wt 212 lb 12.8 oz (96.5 kg)   LMP 06/20/2015 Comment: no chance pregnant,husband has had vasectomy pt sates  SpO2 98%   BMI 37.70 kg/m   BP/Weight 03/01/2021 12/21/2020 05/23/6008  Systolic BP 932 355 732  Diastolic BP 78 86 81  Wt. (Lbs) 212.8 211.2 215.4  BMI 37.7 36.83 37.56      Physical Exam Constitutional:      Appearance: She is well-developed.  Cardiovascular:     Rate and Rhythm: Normal rate.     Heart sounds: Normal heart sounds. No murmur heard. Pulmonary:     Effort: Pulmonary effort is normal.     Breath sounds: Normal breath sounds. No wheezing or rales.  Chest:     Chest wall: No tenderness.  Abdominal:     General: Bowel sounds are normal. There is no distension.     Palpations: Abdomen is soft. There is no mass.     Tenderness: There is no abdominal tenderness.  Musculoskeletal:        General: Normal range of motion.     Right lower leg: No edema.     Left lower leg: No edema.  Neurological:     Mental Status: She is alert and oriented to person, place, and time.  Psychiatric:        Mood and Affect: Mood normal.    CMP Latest Ref Rng & Units 12/21/2020 05/16/2020 07/28/2019  Glucose 70 - 99 mg/dL 123(H) 125(H) 147(H)  BUN 8 - 27 mg/dL 14 9 7(L)  Creatinine 0.57 - 1.00 mg/dL 0.92 0.80 0.81   Sodium 134 - 144 mmol/L 141 138 140  Potassium 3.5 - 5.2 mmol/L 4.7 5.0 4.4  Chloride 96 - 106 mmol/L 103 100 102  CO2 20 - 29 mmol/L 23 22 21   Calcium 8.7 - 10.3 mg/dL 9.5 9.8 9.4  Total Protein 6.0 - 8.5 g/dL 7.8 7.7 7.5  Total Bilirubin 0.0 - 1.2 mg/dL 0.5 0.5 0.4  Alkaline Phos 44 - 121 IU/L 110 113 117  AST 0 - 40 IU/L 21 23 28   ALT 0 - 32 IU/L 18 18 26     Lipid  Panel     Component Value Date/Time   CHOL 192 12/21/2020 1200   TRIG 119 12/21/2020 1200   HDL 65 12/21/2020 1200   CHOLHDL 3.0 12/21/2020 1200   CHOLHDL 3.2 01/19/2016 1043   VLDL 31 (H) 01/19/2016 1043   LDLCALC 106 (H) 12/21/2020 1200    CBC    Component Value Date/Time   WBC 8.4 03/22/2015 0918   RBC 4.61 03/22/2015 0918   HGB 13.8 03/22/2015 0918   HCT 41.9 03/22/2015 0918   PLT 286 03/22/2015 0918   MCV 90.9 03/22/2015 0918   MCV 60.0 (A) 04/30/2012 1328   MCH 29.9 03/22/2015 0918   MCHC 32.9 03/22/2015 0918   RDW 13.1 03/22/2015 0918   LYMPHSABS 2.7 03/22/2015 0918   MONOABS 0.6 03/22/2015 0918   EOSABS 0.3 03/22/2015 0918   BASOSABS 0.0 03/22/2015 0918    Lab Results  Component Value Date   HGBA1C 7.5 (A) 03/01/2021    Assessment & Plan:  1. Type 2 diabetes mellitus with other specified complication, without long-term current use of insulin (HCC) A1c of 7.5 which is slightly above goal of 7.0 No regimen change today but she has been advised to work on her lifestyle modifications Counseled on Diabetic diet, my plate method, 016 minutes of moderate intensity exercise/week Blood sugar logs with fasting goals of 80-120 mg/dl, random of less than 180 and in the event of sugars less than 60 mg/dl or greater than 400 mg/dl encouraged to notify the clinic. Advised on the need for annual eye exams, annual foot exams, Pneumonia vaccine. - POCT glucose (manual entry) - POCT glycosylated hemoglobin (Hb A1C) - dapagliflozin propanediol (FARXIGA) 5 MG TABS tablet; TAKE 1 TABLET (5 MG TOTAL) BY MOUTH  DAILY BEFORE BREAKFAST.  Dispense: 30 tablet; Refill: 6  2. Gastroesophageal reflux disease without esophagitis Controlled - pantoprazole (PROTONIX) 40 MG tablet; TAKE 1 TABLET (40 MG TOTAL) BY MOUTH DAILY.  Dispense: 30 tablet; Refill: 6  3. Other diabetic neurological complication associated with type 2 diabetes mellitus (HCC) Uncontrolled Increased dose of gabapentin - gabapentin (NEURONTIN) 300 MG capsule; Take orally 2 caps in the morning and 3 caps in the evening  Dispense: 150 capsule; Refill: 6  4. Hypertension associated with diabetes ([place]) Controlled Continue lisinopril/HCTZ Counseled on blood pressure goal of less than 130/80, low-sodium, DASH diet, medication compliance, 150 minutes of moderate intensity exercise per week. Discussed medication compliance, adverse effects.   5. Hyperlipidemia associated with type 2 diabetes mellitus ([place]) Last LDL was 106 above goal of less than 100 Continue statin Will check lipid panel at next visit [place] Maintenance: Advised to apply for CAFA so we can refer her for colonoscopy as she is on a 3-year recall and is due for her colonoscopy as last one was in 2019.  Meds ordered this encounter  Medications   dapagliflozin propanediol (FARXIGA) 5 MG TABS tablet    Sig: TAKE 1 TABLET (5 MG TOTAL) BY MOUTH DAILY BEFORE BREAKFAST.    Dispense:  30 tablet    Refill:  6   pantoprazole (PROTONIX) 40 MG tablet    Sig: TAKE 1 TABLET (40 MG TOTAL) BY MOUTH DAILY.    Dispense:  30 tablet    Refill:  6   gabapentin (NEURONTIN) 300 MG capsule    Sig: Take orally 2 caps in the morning and 3 caps in the evening    Dispense:  150 capsule    Refill:  6  Follow-up: Return in about 3 months (around 05/30/2021) for Chronic medical conditions.       Charlott Rakes, MD, FAAFP. [facility] and [place], [place]   03/01/2021, 10:27 AM

## 2021-03-01 NOTE — Patient Instructions (Signed)
Hacer ejercicio para bajar de peso Exercising to [employer] ejercicio de forma regular es importante para todos. Es especialmente importante si tiene sobrepeso. El sobrepeso aumenta el riesgo de tener enfermedad cardaca, accidente cerebrovascular, diabetes, presin arterial alta y varios tipos de cncer. Hacer ejercicio y reducir las caloras que consume puede ayudarle a Sports coach de [place] y a Teacher, English as a foreign language su estado fsico y Music therapist. El ejercicio puede ser de intensidad moderada o vigorosa. Para bajar de peso, la [employer] de las personas debe hacer una determinada cantidad de ejercicio de intensidad moderada o vigorosa cada semana. Cmo puede afectarme el ejercicio? Cuando hace suficiente ejercicio como para quemar ms caloras que las que consume, baja de [place]. Tambin reduce la grasa corporal y aumenta la masa muscular. Cuanto ms msculo tenga, mayor cantidad de Nurse, children's. El ejercicio tambin: Mejora el estado de nimo. Reduce el estrs y las tensiones. Mejora el estado fsico general, la flexibilidad y la resistencia. Aumenta la fuerza sea. Ejercicio de intensidad moderada El ejercicio de intensidad moderada es cualquier actividad que lo haga moverse lo suficiente como para quemar al menos tres veces ms energa (caloras) que si estuviera sentado. Algunos ejemplos de ejercicio de intensidad moderada son: Caminar una milla (1,6 kilmetros) en 15 minutos. Hacer trabajos de jardinera livianos. Andar en bicicleta a un ritmo fcil de aguantar. La [employer] de las personas debe hacer al menos 150 minutos por semana de ejercicio de intensidad moderada para Theatre manager su Engineer, site. Ejercicio de intensidad vigorosa El ejercicio de intensidad vigorosa es cualquier actividad que lo haga moverse lo suficiente como para quemar al menos seis veces ms caloras que si estuviera sentado. Al hacer ejercicio a esta intensidad, su nivel de esfuerzo debera ser lo suficientemente alto como para no  permitirle Programmer, systems. Algunos ejemplos de ejercicio de intensidad vigorosa son: Optometrist. Practicar un deporte de equipo, como ftbol americano, baloncesto y ftbol. Saltar la cuerda. La [employer] de las personas debe hacer al menos 75 minutos por semana de ejercicio de intensidad vigorosa para Theatre manager su peso corporal. Qu medidas puedo tomar para bajar de peso? La cantidad de ejercicio que necesita realizar para bajar de peso depende de: Su edad. El tipo de ejercicio. Cualquier afeccin de [employer]. Su capacidad fsica general. Pregntele al mdico cunto ejercicio debe realizar y qu tipos de actividades son seguras para usted. Nutricin  [employer] Financial dieta como se lo haya indicado el mdico o especialista en alimentacin y nutricin (nutricionista). Esto puede incluir: Consumir menos caloras. Consumir ms protenas. Consumir menos grasas no saludables. Seguir una dieta que incluya frutas y verduras frescas, cereales integrales, productos lcteos semidescremados y Advertising account planner. Evite los alimentos con grasa, sal y azcar agregadas. Beba gran cantidad de agua mientras hace ejercicio para evitar la deshidratacin o los golpes de Freight forwarder. Actividad Elija una actividad que disfrute y establezca objetivos realistas. El mdico puede ayudarlo a Paediatric nurse un plan de ejercicio que funcione para usted. Haga ejercicio a una intensidad moderada o vigorosa la [employer] de la [place]. La intensidad de la actividad fsica puede variar de Ardelia Mems persona a Theatre manager. Puede saber qu tan intensa una rutina de ejercicios es para usted al Sales promotion account executive atencin a su respiracin y latidos cardacos. La [employer] de las personas notar que su respiracin y latidos cardacos se aceleran al Optometrist ejercicio de mayor intensidad. Haga entrenamiento de [employer] veces por semana, como: Flexiones de [place]. Abdominales. Levantamiento de pesas. Uso de bandas elsticas de  resistencia. Hacer ejercicio en perodos cortos de tiempo puede ser tan til como en perodos largos y estructurados. Si tiene problemas para Museum/gallery curator para Engineer, site, intente hacer estas cosas como parte de su rutina diaria: Fransico Him, estrese y camine cada 30 minutos a lo largo del Training and development officer. Vaya a caminar durante su hora de almuerzo. Estacione el auto lejos de su lugar de destino. Si [place] transporte pblico, bjese una parada antes y camine el resto del camino. Pngase de pie y camine cada vez que hable por telfono. Utilice la Writer del ascensor o la Civil engineer, contracting. Use ropa cmoda y calzado con buen soporte. No haga ejercicio en exceso que pudiera hacer que se lastime, se sienta mareado o tenga dificultad para respirar. Dnde buscar ms informacin U.S. Department of Health and [employer] ([place] y [place]): BondedCompany.at Centers for Disease Control and Prevention (Centros para Building surveyor y la Prevencin de Arboriculturist): http://www.wolf.info/ Comunquese con un mdico: Antes de comenzar un nuevo programa de ejercicios. Si tiene preguntas o inquietudes acerca de su peso. Si tiene un problema mdico que Producer, television/film/video. Solicite ayuda de inmediato si: Presenta algo de lo siguiente mientras hace ejercicio: Lesiones. Mareos. Dificultad para respirar o falta de aire que no desaparecen al dejar de hacer ejercicio. Dolor en el pecho. Latidos cardacos rpidos. Estos sntomas pueden representar un problema grave que constituye Engineer, maintenance (IT). No espere a ver si los sntomas desaparecen. Solicite atencin mdica de inmediato. Comunquese con el servicio de emergencias de su localidad (911 en los [place]). No conduzca por sus propios medios [employer] hospital. Resumen Hacer ejercicio con regularidad es especialmente importante si tiene sobrepeso. El sobrepeso aumenta el riesgo de tener enfermedad cardaca, accidente  cerebrovascular, diabetes, presin arterial alta y varios tipos de cncer. Para perder peso debe quemar ms caloras que las que consume. Reducir la cantidad de caloras que consume, y Field seismologist ejercicio de intensidad moderada o vigorosa todas las [place], [place] a Sports coach de [place]. Esta informacin no tiene Marine scientist el consejo del mdico. Asegrese de hacerle al mdico cualquier pregunta que tenga. Document Revised: 05/29/2020 Document Reviewed: 05/29/2020 Elsevier Patient Education  [place].

## 2021-03-09 ENCOUNTER — Other Ambulatory Visit: Payer: Self-pay

## 2021-03-28 NOTE — Addendum Note (Signed)
Addended by: Demetrius Revel on: 03/28/2021 01:00 PM   Modules accepted: Orders

## 2021-03-29 ENCOUNTER — Ambulatory Visit
Admission: RE | Admit: 2021-03-29 | Discharge: 2021-03-29 | Disposition: A | Discharge status: Home / Self Care | Payer: No Typology Code available for payment source | Source: Ambulatory Visit | Attending: Obstetrics and Gynecology | Admitting: Obstetrics and Gynecology

## 2021-03-29 ENCOUNTER — Ambulatory Visit: Payer: No Typology Code available for payment source

## 2021-03-29 ENCOUNTER — Other Ambulatory Visit: Payer: Self-pay

## 2021-03-29 DIAGNOSIS — Z1231 Encounter for screening mammogram for malignant neoplasm of breast: Secondary | ICD-10-CM

## 2021-03-30 ENCOUNTER — Other Ambulatory Visit: Payer: Self-pay

## 2021-03-30 ENCOUNTER — Other Ambulatory Visit (HOSPITAL_COMMUNITY): Payer: Self-pay

## 2021-04-01 ENCOUNTER — Other Ambulatory Visit: Payer: Self-pay | Admitting: Nurse Practitioner

## 2021-04-01 DIAGNOSIS — R928 Other abnormal and inconclusive findings on diagnostic imaging of breast: Secondary | ICD-10-CM

## 2021-04-04 ENCOUNTER — Other Ambulatory Visit: Payer: Self-pay | Admitting: Obstetrics and Gynecology

## 2021-04-04 DIAGNOSIS — R928 Other abnormal and inconclusive findings on diagnostic imaging of breast: Secondary | ICD-10-CM

## 2021-04-09 ENCOUNTER — Other Ambulatory Visit: Payer: Self-pay

## 2021-04-17 ENCOUNTER — Ambulatory Visit: Payer: Self-pay

## 2021-04-17 NOTE — Telephone Encounter (Signed)
Carmen Burns [id number]  Chief Complaint: dizziness Symptoms: dizziness, blurred vision, pain in bladder and kidney Frequency: + 1 week Pertinent Negatives: NA Disposition: [] ED /[] Urgent Care (no appt availability in office) / [] Appointment(In office/virtual)/ []  [facility]/ [] Home Care/ [] Refused Recommended Disposition /[x] [place]/ []  Follow-up with PCP Additional Notes: pt has stopped taking the medication and symptoms have decreased but still present.    Summary: dizziness and blurred vision   Patient d/c taking dapagliflozin propanediol (FARXIGA) 5 MG TABS  4 days ago stating she started to experience dizziness and blurred vision.   Patient states she takes gabapentin (NEURONTIN) 300 MG capsule which also  causes blurred vision and dizziness. Patient states the combination of both medications are having a major effect on her vision and her feeling dizzy.   Please call with an interpreter on the lin .       Reason for Disposition  Taking a medicine that could cause dizziness (e.g., blood pressure medications, diuretics)  Answer Assessment - Initial Assessment Questions 1. DESCRIPTION: "Describe your dizziness."     No control 2. LIGHTHEADED: "Do you feel lightheaded?" (e.g., somewhat faint, woozy, weak upon standing)     no 3. VERTIGO: "Do you feel like either you or the room is spinning or tilting?" (i.e. vertigo)     no 4. SEVERITY: "How bad is it?"  "Do you feel like you are going to faint?" "Can you stand and walk?"   - MILD: Feels slightly dizzy, but walking normally.   - MODERATE: Feels unsteady when walking, but not falling; interferes with normal activities (e.g., school, work).   - SEVERE: Unable to walk without falling, or requires assistance to walk without falling; feels like passing out now.      Mild to moderate 5. ONSET:  "When did the dizziness begin?"     1 week  10. OTHER SYMPTOMS: "Do you have any other symptoms?" (e.g., fever, chest pain,  vomiting, diarrhea, bleeding)       Blurred vision, pain in urinary tract and kidney area  Protocols used: Dizziness - Lightheadedness-A-AH

## 2021-04-18 ENCOUNTER — Other Ambulatory Visit: Payer: Self-pay

## 2021-04-19 ENCOUNTER — Other Ambulatory Visit: Payer: Self-pay

## 2021-04-19 ENCOUNTER — Ambulatory Visit: Payer: Self-pay | Admitting: Physician Assistant

## 2021-04-19 VITALS — BP 143/79 | HR 84 | Temp 98.2°F | Resp 18 | Ht 65.0 in | Wt 210.0 lb

## 2021-04-19 DIAGNOSIS — Z789 Other specified health status: Secondary | ICD-10-CM

## 2021-04-19 DIAGNOSIS — E1169 Type 2 diabetes mellitus with other specified complication: Secondary | ICD-10-CM

## 2021-04-19 DIAGNOSIS — I10 Essential (primary) hypertension: Secondary | ICD-10-CM

## 2021-04-19 DIAGNOSIS — R300 Dysuria: Secondary | ICD-10-CM

## 2021-04-19 DIAGNOSIS — E1149 Type 2 diabetes mellitus with other diabetic neurological complication: Secondary | ICD-10-CM

## 2021-04-19 DIAGNOSIS — R42 Dizziness and giddiness: Secondary | ICD-10-CM

## 2021-04-19 DIAGNOSIS — H538 Other visual disturbances: Secondary | ICD-10-CM

## 2021-04-19 LAB — POCT URINALYSIS DIP (CLINITEK)
Bilirubin, UA: NEGATIVE
Blood, UA: NEGATIVE
Glucose, UA: NEGATIVE mg/dL
Ketones, POC UA: NEGATIVE mg/dL
Leukocytes, UA: NEGATIVE
Nitrite, UA: NEGATIVE
POC PROTEIN,UA: NEGATIVE
Spec Grav, UA: 1.015 (ref 1.010–1.025)
Urobilinogen, UA: 0.2 E.U./dL
pH, UA: 6 (ref 5.0–8.0)

## 2021-04-19 NOTE — Progress Notes (Signed)
Patient has eaten and taken medication today Patient reports beginning [place] and Gabapentin "not too long ago" which has began side effects of urine discomfort and vomiting with fatigue.

## 2021-04-19 NOTE — Patient Instructions (Addendum)
I encourage you to increase your water intake.  Make sure that you are checking your blood sugars daily and check it if you have an episode of dizziness or blurry vision.  Please write those down and bring them with you when you come back to the mobile unit in two weeks.  Kennieth Rad, PA-C Physician Assistant [place] [email or web address]  [facility] Dizziness Los mareos son un problema muy frecuente. Se trata de una sensacin de inestabilidad o desvanecimiento. Puede sentir que se va a desmayar. Los [employer] pueden provocarle una lesin si se tropieza o se cae. Las Engineer, manufacturing de todas las edades pueden sufrir Tree surgeon, Armed forces training and education officer es ms frecuente en los adultos [place]. Esta afeccin puede tener muchas causas, entre las que se pueden [employer], la deshidratacin y [place]. Siga estas instrucciones en su casa: Comida y bebida  Beba suficiente lquido como para Theatre manager la orina de color amarillo plido. Esto evita la deshidratacin. Trate de beber ms lquidos transparentes, como agua. No beba alcohol. Limite el consumo de cafena si el mdico se lo indica. Verifique los ingredientes y la informacin nutricional para saber si un alimento o una bebida contienen cafena. Limite el consumo de sal (sodio) si el mdico se lo indica. Verifique los ingredientes y la informacin nutricional para saber si un alimento o una bebida contienen sodio. Actividad  Evite los movimientos rpidos. Levntese de las sillas con lentitud y apyese hasta sentirse bien. Por la maana, sintese primero a un lado de la cama. Cuando se sienta bien, pngase lentamente de pie mientras se sostiene de algo, hasta que sepa que ha logrado el equilibrio. Mueva las piernas con frecuencia si debe estar de pie en un lugar durante mucho tiempo. Mientras est de pie, contraiga y relaje los msculos de las piernas. No conduzca vehculos ni opere maquinaria si se  siente mareado. Evite agacharse si se siente mareado. En su casa, coloque los objetos de modo que le resulte fcil alcanzarlos sin Office manager. Estilo de vida No consuma ningn producto que contenga nicotina o tabaco. Estos productos incluyen cigarrillos, tabaco para Higher education careers adviser y aparatos de vapeo, como los Psychologist, sport and exercise. Si necesita ayuda para dejar de fumar, consulte al [employer]. Trate de reducir el nivel de estrs con mtodos como el yoga o la meditacin. Hable con el mdico si necesita ayuda para controlar el nivel de estrs. Instrucciones generales Controle sus mareos para ver si hay cambios. Use los medicamentos de venta libre y los recetados solamente como se lo haya indicado el mdico. Hable con el mdico si cree que los medicamentos que est tomando son la causa de sus mareos. Infrmele a un amigo o a un familiar si se siente mareado. Pdale a esta persona que llame al mdico si observa cambios en su comportamiento. Concurra a [place]. Esto es importante. Comunquese con un mdico si: Los mareos no desaparecen o tiene sntomas nuevos. Los [employer] o la sensacin de Engineer, petroleum. Siente nuseas. Se le redujo la audicin. Tiene fiebre. Dolor o rigidez en el cuello. Los [employer] Corporation derivan en una lesin o una cada. Solicite ayuda de inmediato si: Vomita o tiene diarrea y no puede comer ni beber nada. Tiene dificultad para hablar, caminar, tragar o [employer], las [place]. Se siente constantemente dbil. Tiene cualquier tipo de sangrado. No piensa con claridad o tiene dificultad para armar oraciones. Es posible que un amigo o un familiar adviertan que esto ocurre. Tiene dolor de [place], [employer]  abdominal, sudoracin o le falta el aire. Tiene cambios en la visin o le aparece un dolor de cabeza intenso. Estos sntomas pueden representar un problema grave que constituye Engineer, maintenance (IT). No espere a ver si los sntomas desaparecen. Solicite  atencin mdica de inmediato. Comunquese con el servicio de emergencias de su localidad (911 en los [place]). No conduzca por sus propios medios [employer]. Resumen Los mareos son Ardelia Mems sensacin de inestabilidad o desvanecimiento. Esta afeccin puede tener muchas causas, entre las que se pueden [employer], la deshidratacin y [place]. Las Engineer, manufacturing de todas las edades pueden sufrir Tree surgeon, Armed forces training and education officer es ms frecuente en los adultos [place]. Beba suficiente lquido como para Theatre manager la orina de color amarillo plido. No beba alcohol. Evite los movimientos rpidos si se siente mareado. Controle sus mareos para ver si hay cambios. Esta informacin no tiene Marine scientist el consejo del mdico. Asegrese de hacerle al mdico cualquier pregunta que tenga. Document Revised: 03/06/2020 Document Reviewed: 03/06/2020 Elsevier Patient Education  2022 [employer].

## 2021-04-19 NOTE — Progress Notes (Signed)
Established Patient Office Visit  Subjective:  Patient ID: Carmen Burns, female    DOB: 1961-02-15  Age: 61 y.o. MRN: [id number]  CC:  Chief Complaint  Patient presents with   Medication Management    Farxiga    HPI Carmen Burns states that she has been having bladder pain, dizziness and blurry vision for the past week. States that she continues to have left side "kidney pain".  States that the dizziness as she was leaning to one side and she feels faint.  States that she was seen by her primary care provider on March 01, 2021 and gabapentin dosing was increased.  States that she also started [place] after that office visit.  States that she previously was having episodes of dizziness and blurry vision after taking the gabapentin but states that they worsened with the increase in gabapentin and starting [place].  States that she does check her blood glucose levels on a daily basis and that she did check her blood glucose levels during episodes of dizziness, denies hypoglycemic readings.  States that she did discontinue the [place] and reduced the gabapentin to 600 mg at bedtime.  States that she did this on her own 6 days ago, states that the dizziness and blurry vision has improved but not completely resolved.  States that her bladder pain and lower back pain has improved but still complains of right-sided "kidney pain" some dysuria.    Due to language barrier, an interpreter was present during the history-taking and subsequent discussion (and for part of the physical exam) with this patient.   Past Medical History:  Diagnosis Date   Anemia 2013   Diabetes mellitus without complication ([place])    Hyperlipidemia    Hypertension    Refusal of blood transfusions as patient is Jehovah's Witness     Past Surgical History:  Procedure Laterality Date   APPENDECTOMY     DILITATION & CURRETTAGE/HYSTROSCOPY WITH NOVASURE ABLATION N/A 01/12/2013   Procedure: DILATATION &  CURETTAGE/HYSTEROSCOPY WITH NOVASURE ABLATION;  Surgeon: Osborne Oman, MD;  Location: [facility];  Service: Gynecology;  Laterality: N/A;    Family History  Problem Relation Age of Onset   Diabetes Brother    Hypertension Brother    Diabetes Brother    Diabetes Brother    Diabetes Brother    Diabetes Brother    Diabetes Father    Colon cancer Neg Hx    Breast cancer Neg Hx    Rectal cancer Neg Hx    Stomach cancer Neg Hx    Esophageal cancer Neg Hx     Social History   Socioeconomic History   Marital status: Married    Spouse name: Not on file   Number of children: 3   Years of education: Not on file   Highest education level: 9th grade  Occupational History   Not on file  Tobacco Use   Smoking status: Never   Smokeless tobacco: Never  Vaping Use   Vaping Use: Never used  Substance and Sexual Activity   Alcohol use: Yes    Alcohol/week: 1.0 standard drink    Types: 1 Glasses of wine per week    Comment: socially   Drug use: No   Sexual activity: Yes    Birth control/protection: Surgical    Comment: husband vasectomy  Other Topics Concern   Not on file  Social History Narrative   Not on file   Social Determinants of Health   Financial Resource Strain: Not on  file  Food Insecurity: Not on file  Transportation Needs: Not on file  Physical Activity: Not on file  Stress: Not on file  Social Connections: Not on file  Intimate Partner Violence: Not on file    Outpatient Medications Prior to Visit  Medication Sig Dispense Refill   atorvastatin (LIPITOR) 40 MG tablet TAKE 1 TABLET (40 MG TOTAL) BY MOUTH DAILY. 90 tablet 1   Blood Glucose Monitoring Suppl (TRUE METRIX METER) W/DEVICE KIT USE TO CHECK BLOOD SUGAR THREE TIMES DAILY AND AT BEDTIME 1 kit 0   cetirizine (ZYRTEC) 10 MG tablet Take 1 tablet (10 mg total) by mouth daily. 30 tablet 3   glipiZIDE (GLUCOTROL) 10 MG tablet TAKE 1 TABLET (10 MG TOTAL) BY MOUTH 2 (TWO) TIMES DAILY BEFORE A MEAL. 180 tablet 1    glucose blood (TRUE METRIX BLOOD GLUCOSE TEST) test strip USE TO CHECK BLOOD SUGAR THREE TIMES DAILY AND AT BEDTIME 100 each 12   lisinopril-hydrochlorothiazide (ZESTORETIC) 10-12.5 MG tablet TAKE 1 TABLET BY MOUTH DAILY. 30 tablet 6   pantoprazole (PROTONIX) 40 MG tablet TAKE 1 TABLET (40 MG TOTAL) BY MOUTH DAILY. 30 tablet 6   sitaGLIPtin (JANUVIA) 100 MG tablet Take 1 tablet (100 mg total) by mouth daily. 30 tablet 6   TRUEPLUS LANCETS 28G MISC USE TO CHECK BLOOD SUGAR THREE TIMES DAILY AND AT BEDTIME 100 each 2   dapagliflozin propanediol (FARXIGA) 5 MG TABS tablet TAKE 1 TABLET (5 MG TOTAL) BY MOUTH DAILY BEFORE BREAKFAST. (Patient not taking: Reported on 04/19/2021) 30 tablet 6   gabapentin (NEURONTIN) 300 MG capsule Take orally 2 caps in the morning and 3 caps in the evening (Patient not taking: Reported on 04/19/2021) 150 capsule 6   hydrocortisone-pramoxine (ANALPRAM-HC) 2.5-1 % rectal cream PLACE 1 APPLICATION RECTALLY 3 (THREE) TIMES DAILY. (Patient not taking: Reported on 04/19/2021) 30 g 0   naproxen (NAPROSYN) 500 MG tablet TAKE 1 TABLET (500 MG TOTAL) BY MOUTH 2 (TWO) TIMES DAILY WITH A MEAL. (Patient not taking: Reported on 04/19/2021) 60 tablet 1   olopatadine (PATANOL) 0.1 % ophthalmic solution Place 1 drop into both eyes 2 (two) times daily. (Patient not taking: Reported on 04/19/2021) 5 mL 1   tiZANidine (ZANAFLEX) 4 MG tablet TAKE 1 TABLET (4 MG TOTAL) BY MOUTH EVERY 8 (EIGHT) HOURS AS NEEDED FOR MUSCLE SPASMS. (Patient not taking: Reported on 04/19/2021) 60 tablet 1   No facility-administered medications prior to visit.    Allergies  Allergen Reactions   Penicillins Itching and Rash    ROS Review of Systems  Constitutional:  Negative for chills and fever.  HENT: Negative.    Eyes:  Positive for visual disturbance.  Respiratory:  Negative for cough and shortness of breath.   Cardiovascular:  Negative for chest pain.  Gastrointestinal:  Negative for abdominal pain, nausea  and vomiting.  Endocrine: Negative.   Genitourinary:  Positive for dysuria. Negative for frequency, hematuria, urgency and vaginal discharge.  Musculoskeletal: Negative.   Skin: Negative.   Allergic/Immunologic: Negative.   Neurological:  Positive for dizziness and weakness. Negative for syncope and speech difficulty.  Hematological: Negative.   Psychiatric/Behavioral: Negative.       Objective:    Physical Exam Vitals and nursing note reviewed.  Constitutional:      General: She is not in acute distress.    Appearance: Normal appearance. She is obese. She is not ill-appearing.  HENT:     Head: Normocephalic and atraumatic.     Right Ear:  External ear normal.     Left Ear: External ear normal.     Mouth/Throat:     Mouth: Mucous membranes are moist.     Pharynx: Oropharynx is clear.  Eyes:     Extraocular Movements: Extraocular movements intact.     Conjunctiva/sclera: Conjunctivae normal.     Pupils: Pupils are equal, round, and reactive to light.  Cardiovascular:     Rate and Rhythm: Normal rate and regular rhythm.     Pulses: Normal pulses.     Heart sounds: Normal heart sounds.  Pulmonary:     Effort: Pulmonary effort is normal.     Breath sounds: Normal breath sounds.  Abdominal:     Tenderness: There is no right CVA tenderness or left CVA tenderness.  Musculoskeletal:        General: Normal range of motion.     Cervical back: Normal range of motion and neck supple.  Skin:    General: Skin is warm and dry.  Neurological:     General: No focal deficit present.     Mental Status: She is alert and oriented to person, place, and time.     Cranial Nerves: No cranial nerve deficit.  Psychiatric:        Mood and Affect: Mood normal.        Behavior: Behavior normal.        Thought Content: Thought content normal.        Judgment: Judgment normal.    BP (!) 143/79 (BP Location: Left Arm, Patient Position: Sitting, Cuff Size: Normal)    Pulse 84    Temp 98.2 F (36.8  C) (Oral)    Resp 18    Ht _0  (1.651 m)    Wt 210 lb (95.3 kg)    LMP 06/20/2015 Comment: no chance pregnant,husband has had vasectomy pt sates   SpO2 99%    BMI 34.95 kg/m  Wt Readings from Last 3 Encounters:  04/19/21 210 lb (95.3 kg)  03/01/21 212 lb 12.8 oz (96.5 kg)  12/21/20 211 lb 3.2 oz (95.8 kg)     Health Maintenance Due  Topic Date Due   COVID-19 Vaccine (5 - Booster) 03/18/2020   OPHTHALMOLOGY EXAM  04/13/2020   COLONOSCOPY (Pts 45-26yr Insurance coverage will need to be confirmed)  11/27/2020    There are no preventive care reminders to display for this patient.  Lab Results  Component Value Date   TSH 2.020 04/04/2017   Lab Results  Component Value Date   WBC 8.4 03/22/2015   HGB 13.8 03/22/2015   HCT 41.9 03/22/2015   MCV 90.9 03/22/2015   PLT 286 03/22/2015   Lab Results  Component Value Date   NA 141 12/21/2020   K 4.7 12/21/2020   CO2 23 12/21/2020   GLUCOSE 123 (H) 12/21/2020   BUN 14 12/21/2020   CREATININE 0.92 12/21/2020   BILITOT 0.5 12/21/2020   ALKPHOS 110 12/21/2020   AST 21 12/21/2020   ALT 18 12/21/2020   PROT 7.8 12/21/2020   ALBUMIN 4.6 12/21/2020   CALCIUM 9.5 12/21/2020   EGFR 71 12/21/2020   Lab Results  Component Value Date   CHOL 192 12/21/2020   Lab Results  Component Value Date   HDL 65 12/21/2020   Lab Results  Component Value Date   LDLCALC 106 (H) 12/21/2020   Lab Results  Component Value Date   TRIG 119 12/21/2020   Lab Results  Component Value Date   CHOLHDL 3.0  12/21/2020   Lab Results  Component Value Date   HGBA1C 7.5 (A) 03/01/2021      Assessment & Plan:   Problem List Items Addressed This Visit   None Visit Diagnoses     Dizziness and giddiness    -  Primary   Relevant Orders   Comp. Metabolic Panel (12)   TSH   CBC with Differential/Platelet   Blurry vision       Dysuria       Relevant Orders   POCT URINALYSIS DIP (CLINITEK)       No orders of the defined types were placed  in this encounter. 1. Dizziness and giddiness Patient is difficult historian, mostly due to language barrier.  Unsure why patient just now started [place] on further review of patient's record, this was initially prescribed to patient in February 2022 after metformin was discontinued due to adverse effects.  Patient has discontinued Wilder Glade and has reduced her gabapentin to 600 mg qhs.  Encourage patient to continue monitoring blood glucose levels, check blood glucose level if any further episodes of dizziness occur.  Keep a written log and bring back to mobile unit in 2 weeks for follow-up.  Patient education given on supportive care, red flags for prompt reevaluation. - Comp. Metabolic Panel (12) - TSH - CBC with Differential/Platelet  2. Blurry vision   3. Dysuria UA negative for urinary tract infection, patient encouraged to increase water intake, red flags for prompt reevaluation - POCT URINALYSIS DIP (CLINITEK)  4. Type 2 diabetes mellitus with other specified complication, without long-term current use of insulin ([place])   5. Hypertension, unspecified type   6. Other diabetic neurological complication associated with type 2 diabetes mellitus ([place])   7. Language barrier; speaks Spanish only   I have reviewed the patient's medical history (PMH, PSH, Social History, Family History, Medications, and allergies) , and have been updated if relevant. I spent 40 minutes reviewing chart and  face to face time with patient.     Follow-up: Return in about 2 weeks (around 05/03/2021).    Loraine Grip Mayers, PA-C

## 2021-04-20 LAB — CBC WITH DIFFERENTIAL/PLATELET
Basophils Absolute: 0.1 10*3/uL (ref 0.0–0.2)
Basos: 1 %
EOS (ABSOLUTE): 0.1 10*3/uL (ref 0.0–0.4)
Eos: 2 %
Hematocrit: 43.4 % (ref 34.0–46.6)
Hemoglobin: 14.5 g/dL (ref 11.1–15.9)
Immature Grans (Abs): 0 10*3/uL (ref 0.0–0.1)
Immature Granulocytes: 0 %
Lymphocytes Absolute: 2.6 10*3/uL (ref 0.7–3.1)
Lymphs: 38 %
MCH: 29.6 pg (ref 26.6–33.0)
MCHC: 33.4 g/dL (ref 31.5–35.7)
MCV: 89 fL (ref 79–97)
Monocytes Absolute: 0.5 10*3/uL (ref 0.1–0.9)
Monocytes: 7 %
Neutrophils Absolute: 3.6 10*3/uL (ref 1.4–7.0)
Neutrophils: 52 %
Platelets: 217 10*3/uL (ref 150–450)
RBC: 4.9 x10E6/uL (ref 3.77–5.28)
RDW: 12.2 % (ref 11.7–15.4)
WBC: 6.9 10*3/uL (ref 3.4–10.8)

## 2021-04-20 LAB — COMP. METABOLIC PANEL (12)
AST: 21 IU/L (ref 0–40)
Albumin/Globulin Ratio: 1.2 (ref 1.2–2.2)
Albumin: 4 g/dL (ref 3.8–4.8)
Alkaline Phosphatase: 139 IU/L — ABNORMAL HIGH (ref 44–121)
BUN/Creatinine Ratio: 16 (ref 12–28)
BUN: 12 mg/dL (ref 8–27)
Bilirubin Total: 0.5 mg/dL (ref 0.0–1.2)
Calcium: 10 mg/dL (ref 8.7–10.3)
Chloride: 101 mmol/L (ref 96–106)
Creatinine, Ser: 0.76 mg/dL (ref 0.57–1.00)
Globulin, Total: 3.3 g/dL (ref 1.5–4.5)
Glucose: 173 mg/dL — ABNORMAL HIGH (ref 70–99)
Potassium: 4.1 mmol/L (ref 3.5–5.2)
Sodium: 137 mmol/L (ref 134–144)
Total Protein: 7.3 g/dL (ref 6.0–8.5)
eGFR: 89 mL/min/{1.73_m2} (ref >59)

## 2021-04-20 LAB — TSH: TSH: 2.98 u[IU]/mL (ref 0.450–4.500)

## 2021-04-23 ENCOUNTER — Telehealth: Payer: Self-pay | Admitting: *Deleted

## 2021-04-23 NOTE — Telephone Encounter (Signed)
Medical Assistant used [employer] to contact patient.  Interpreter Name: Justus Memory #: [id number] Patient verified DOB Patient is aware of labs results with no concerns.

## 2021-04-23 NOTE — Telephone Encounter (Signed)
-----   Message from Kennieth Rad, [place] sent at 04/23/2021  9:14 AM EST ----- Please call patient and let her know that her thyroid function, and kidney function are within normal limits.  She does have one liver enzyme that is slightly elevated and this should be rechecked at her next office visit.  She does not show signs of anemia.

## 2021-04-24 ENCOUNTER — Other Ambulatory Visit: Payer: Self-pay

## 2021-04-25 ENCOUNTER — Other Ambulatory Visit: Payer: Self-pay

## 2021-04-26 ENCOUNTER — Other Ambulatory Visit: Payer: Self-pay

## 2021-05-02 ENCOUNTER — Other Ambulatory Visit: Payer: Self-pay

## 2021-05-22 ENCOUNTER — Other Ambulatory Visit: Payer: Self-pay

## 2021-05-22 ENCOUNTER — Ambulatory Visit: Payer: No Typology Code available for payment source

## 2021-05-22 ENCOUNTER — Ambulatory Visit
Admission: RE | Admit: 2021-05-22 | Discharge: 2021-05-22 | Disposition: A | Discharge status: Home / Self Care | Payer: No Typology Code available for payment source | Source: Ambulatory Visit | Attending: Obstetrics and Gynecology | Admitting: Obstetrics and Gynecology

## 2021-05-22 ENCOUNTER — Ambulatory Visit: Payer: Self-pay | Admitting: *Deleted

## 2021-05-22 VITALS — BP 138/90 | Wt 209.3 lb

## 2021-05-22 DIAGNOSIS — N644 Mastodynia: Secondary | ICD-10-CM

## 2021-05-22 DIAGNOSIS — Z1239 Encounter for other screening for malignant neoplasm of breast: Secondary | ICD-10-CM

## 2021-05-22 DIAGNOSIS — R928 Other abnormal and inconclusive findings on diagnostic imaging of breast: Secondary | ICD-10-CM

## 2021-05-22 DIAGNOSIS — Z1211 Encounter for screening for malignant neoplasm of colon: Secondary | ICD-10-CM

## 2021-05-22 NOTE — Patient Instructions (Signed)
Explained breast self awareness with Carmen Burns. Patient did not need a Pap smear today due to last Pap smear and HPV typing was 06/20/2020. Let her know BCCCP will cover Pap smears and HPV typing every 5 years unless has a history of abnormal Pap smears. Referred patient to the [facility] for a left breast diagnostic mammogram per recommendation. Appointment scheduled Tuesday, May 22, 2021 at 1400. Patient aware of appointment and will be there. Carmen Burns verbalized understanding.  Carmen Burns, Arvil Chaco, RN 12:48 PM

## 2021-05-22 NOTE — Progress Notes (Signed)
Carmen Burns is a 61 y.o. female who presents to [facility] clinic today with complaint of bilateral outer breast pain x 6 months and a left breast lump x 3 months. Patient states the pain comes and goes. Patient rates the pain at a 7 out of 10. Patient referred to [facility] by the [place] due to having a screening mammogram completed 03/29/2021 that additional imaging of the left breast is recommended for follow up.   Pap Smear: Pap smear not completed today. Last Pap smear was 06/20/2020 at [facility] and was normal with negative HPV. Per patient has no history of an abnormal Pap smear. Last Pap smear result is available in Epic.   Physical exam: Breasts Breasts symmetrical. No skin abnormalities bilateral breasts. No nipple retraction bilateral breasts. No nipple discharge bilateral breasts. No lymphadenopathy. No lumps palpated bilateral breasts. Unable to palpate any lumps at patients area of concern on exam within the left breast. Complaints of bilateral outer breast tenderness on exam.      MS DIGITAL SCREENING BILATERAL  Result Date: 01/08/2017 CLINICAL DATA:  Screening. EXAM: DIGITAL SCREENING BILATERAL MAMMOGRAM WITH CAD COMPARISON:  Previous exam(s). ACR Breast Density Category b: There are scattered areas of fibroglandular density. FINDINGS: There are no findings suspicious for malignancy. Images were processed with CAD. IMPRESSION: No mammographic evidence of malignancy. A result letter of this screening mammogram will be mailed directly to the patient. RECOMMENDATION: Screening mammogram in one year. (Code:SM-B-01Y) BI-RADS CATEGORY  1: Negative. Electronically Signed   By: Dorise Bullion III M.D   On: 01/08/2017 15:25   MM 3D SCREEN BREAST BILATERAL  Result Date: 03/30/2021 CLINICAL DATA:  Screening. EXAM: DIGITAL SCREENING BILATERAL MAMMOGRAM WITH TOMOSYNTHESIS AND CAD TECHNIQUE: Bilateral screening digital craniocaudal and  mediolateral oblique mammograms were obtained. Bilateral screening digital breast tomosynthesis was performed. The images were evaluated with computer-aided detection. COMPARISON:  Previous exam(s). ACR Breast Density Category b: There are scattered areas of fibroglandular density. FINDINGS: In the left breast, a possible asymmetry warrants further evaluation. In the right breast, no findings suspicious for malignancy. IMPRESSION: Further evaluation is suggested for possible asymmetry in the left breast. RECOMMENDATION: Diagnostic mammogram and possibly ultrasound of the left breast. (Code:FI-L-35M) The patient will be contacted regarding the findings, and additional imaging will be scheduled. BI-RADS CATEGORY  0: Incomplete. Need additional imaging evaluation and/or prior mammograms for comparison. Electronically Signed   By: Dorise Bullion III M.D.   On: 03/30/2021 10:10   MS DIGITAL SCREENING TOMO BILATERAL  Result Date: 03/07/2020 CLINICAL DATA:  Screening. EXAM: DIGITAL SCREENING BILATERAL MAMMOGRAM WITH TOMO AND CAD COMPARISON:  Previous exam(s). ACR Breast Density Category b: There are scattered areas of fibroglandular density. FINDINGS: There are no findings suspicious for malignancy. Images were processed with CAD. IMPRESSION: No mammographic evidence of malignancy. A result letter of this screening mammogram will be mailed directly to the patient. RECOMMENDATION: Screening mammogram in one year. (Code:SM-B-01Y) BI-RADS CATEGORY  1: Negative. Electronically Signed   By: Kristopher Oppenheim M.D.   On: 03/07/2020 10:18   MS DIGITAL DIAG TOMO BILAT  Result Date: 12/29/2018 CLINICAL DATA:  Diffuse intermittent left breast pain. EXAM: DIGITAL DIAGNOSTIC BILATERAL MAMMOGRAM WITH CAD AND TOMO COMPARISON:  Previous exam(s). ACR Breast Density Category b: There are scattered areas of fibroglandular density. FINDINGS: No suspicious masses, calcifications, or distortion are identified in either breast.  Mammographic images were processed with CAD. IMPRESSION: No mammographic evidence of malignancy. RECOMMENDATION: Treatment  of the patient's intermittent diffuse left breast pain should be based on clinical and physical exam given lack of imaging findings. Recommend annual screening mammography. I have discussed the findings and recommendations with the patient. If applicable, a reminder letter will be sent to the patient regarding the next appointment. BI-RADS CATEGORY  1: Negative. Electronically Signed   By: Dorise Bullion III M.D   On: 12/29/2018 11:26     Pelvic/Bimanual Pap is not indicated today per BCCCP guidelines.   Smoking History: Patient has never smoked.   Patient Navigation: Patient education provided. Access to services provided for patient through [facility] program. Spanish interpreter Rudene Anda from [facility] provided. Patient has food insecurities and escorted to the food market at the [employer] for groceries.  Colorectal Cancer Screening: Per patient has had colonoscopy completed on 11/27/2017.  FIT Test given to patient complete. No complaints today.    Breast and Cervical Cancer Risk Assessment: Patient does not have family history of breast cancer, known genetic mutations, or radiation treatment to the chest before age 9. Patient does not have history of cervical dysplasia, immunocompromised, or DES exposure in-utero.  Risk Assessment     Risk Scores       05/22/2021 02/10/2020   Last edited by: Royston Bake, CMA [place], Sherie Mamie Nick, LPN   5-year risk: 0.7 % 0.7 %   Lifetime risk: 3.5 % 3.7 %            A: BCCCP exam without pap smear Complaint of bilateral outer breast pain and left breast lump.  P: Referred patient to the [facility] for a left breast diagnostic mammogram per recommendation. Appointment scheduled Tuesday, May 22, 2021 at 1400.  Loletta Parish, RN 05/22/2021 12:48 PM

## 2021-05-31 ENCOUNTER — Other Ambulatory Visit: Payer: Self-pay

## 2021-05-31 ENCOUNTER — Ambulatory Visit: Payer: Self-pay | Attending: Family Medicine | Admitting: Family Medicine

## 2021-05-31 ENCOUNTER — Encounter: Payer: Self-pay | Admitting: Family Medicine

## 2021-05-31 VITALS — BP 125/87 | HR 96 | Ht 65.0 in | Wt 207.6 lb

## 2021-05-31 DIAGNOSIS — J302 Other seasonal allergic rhinitis: Secondary | ICD-10-CM

## 2021-05-31 DIAGNOSIS — E7800 Pure hypercholesterolemia, unspecified: Secondary | ICD-10-CM

## 2021-05-31 DIAGNOSIS — H9203 Otalgia, bilateral: Secondary | ICD-10-CM

## 2021-05-31 DIAGNOSIS — E1169 Type 2 diabetes mellitus with other specified complication: Secondary | ICD-10-CM

## 2021-05-31 DIAGNOSIS — Z86010 Personal history of colonic polyps: Secondary | ICD-10-CM

## 2021-05-31 LAB — POCT GLYCOSYLATED HEMOGLOBIN (HGB A1C): HbA1c, POC (controlled diabetic range): 7.8 % — AB (ref 0.0–7.0)

## 2021-05-31 LAB — GLUCOSE, POCT (MANUAL RESULT ENTRY): POC Glucose: 159 mg/dl — AB (ref 70–99)

## 2021-05-31 MED ORDER — CETIRIZINE HCL 10 MG PO TABS
10.0000 mg | ORAL_TABLET | Freq: Every day | ORAL | 3 refills | Status: AC
  Filled 2021-05-31: qty 30, 30d supply, fill #0
  Filled 2021-12-10: qty 30, 30d supply, fill #1

## 2021-05-31 MED ORDER — GLIPIZIDE 10 MG PO TABS
ORAL_TABLET | Freq: Two times a day (BID) | ORAL | 1 refills | Status: DC
  Filled 2021-05-31: qty 60, 30d supply, fill #0
  Filled 2021-07-05: qty 60, 30d supply, fill #1
  Filled 2021-08-03: qty 60, 30d supply, fill #2
  Filled 2021-09-04: qty 60, 30d supply, fill #3
  Filled 2021-10-02: qty 60, 30d supply, fill #4
  Filled 2021-10-31: qty 60, 30d supply, fill #5

## 2021-05-31 MED ORDER — SITAGLIPTIN PHOSPHATE 100 MG PO TABS
100.0000 mg | ORAL_TABLET | Freq: Every day | ORAL | 1 refills | Status: DC
  Filled 2021-05-31 – 2021-08-03 (×2): qty 90, 90d supply, fill #0
  Filled 2021-10-02: qty 90, 90d supply, fill #1
  Filled 2021-10-31: qty 7, 7d supply, fill #1

## 2021-05-31 MED ORDER — OLOPATADINE HCL 0.1 % OP SOLN
1.0000 [drp] | Freq: Two times a day (BID) | OPHTHALMIC | 1 refills | Status: AC
  Filled 2021-05-31: qty 5, 18d supply, fill #0
  Filled 2021-12-10: qty 5, 18d supply, fill #1

## 2021-05-31 MED ORDER — ATORVASTATIN CALCIUM 40 MG PO TABS
ORAL_TABLET | Freq: Every day | ORAL | 1 refills | Status: DC
  Filled 2021-05-31: qty 30, 30d supply, fill #0
  Filled 2021-12-06: qty 30, 30d supply, fill #1

## 2021-05-31 NOTE — Progress Notes (Signed)
Subjective:  Patient ID: Carmen Burns, female    DOB: 1960/06/13  Age: 61 y.o. MRN: [id number]  CC: Diabetes   HPI Carmen Burns is a 61 y.o. year old female with a history of  type 2 diabetes mellitus (A1c 7.8), diabetic neuropathy hypertension, hyperlipidemia, GERD     Interval History: She has had bilateral ear pain for the last 2 weeks without any hearing loss.  Pain is described as 4/10.  Endorses presence of sinus pressure and she has a little bit of sore throat.  She is also requesting prescription of drops for her eyes for allergies.  She stopped taking Farxiga due to the fact that she felt bad. Complains it made her dizzy and Gabapentin also caused the same symptoms. When she discontinued both medications dizziness improved and is not as bad. She also noticed urinary symptoms with [place]. She did have GI upset with Metformin in the past. Currently takes Januvia, glipizide for her diabetes. Fasting sugars have ranged between 98 and 146 with a single value of 187.  Compliant with her statin and antihypertensive. Denies additional concerns today. Past Medical History:  Diagnosis Date   Anemia 2013   Diabetes mellitus without complication ([place])    Hyperlipidemia    Hypertension    Refusal of blood transfusions as patient is Jehovah's Witness     Past Surgical History:  Procedure Laterality Date   APPENDECTOMY     DILITATION & CURRETTAGE/HYSTROSCOPY WITH NOVASURE ABLATION N/A 01/12/2013   Procedure: DILATATION & CURETTAGE/HYSTEROSCOPY WITH NOVASURE ABLATION;  Surgeon: Osborne Oman, MD;  Location: [facility];  Service: Gynecology;  Laterality: N/A;    Family History  Problem Relation Age of Onset   Diabetes Father    Stroke Sister    Diabetes Brother    Hypertension Brother    Diabetes Brother    Diabetes Brother    Diabetes Brother    Diabetes Brother    Colon cancer Neg Hx    Breast cancer Neg Hx    Rectal cancer Neg Hx    Stomach  cancer Neg Hx    Esophageal cancer Neg Hx     Social History   Socioeconomic History   Marital status: Married    Spouse name: Not on file   Number of children: 3   Years of education: Not on file   Highest education level: 9th grade  Occupational History   Not on file  Tobacco Use   Smoking status: Never   Smokeless tobacco: Never  Vaping Use   Vaping Use: Never used  Substance and Sexual Activity   Alcohol use: Yes    Alcohol/week: 1.0 standard drink    Types: 1 Glasses of wine per week    Comment: socially   Drug use: No   Sexual activity: Yes    Birth control/protection: Surgical    Comment: husband vasectomy  Other Topics Concern   Not on file  Social History Narrative   Not on file   Social Determinants of Health   Financial Resource Strain: Not on file  Food Insecurity: Food Insecurity Present   Worried About [place] in the Last Year: Sometimes true   Ran Out of Food in the Last Year: Sometimes true  Transportation Needs: No Transportation Needs   Lack of Transportation (Medical): No   Lack of Transportation (Non-Medical): No  Physical Activity: Not on file  Stress: Not on file  Social Connections: Not on file    Allergies  Allergen Reactions   Penicillins Itching and Rash    Outpatient Medications Prior to Visit  Medication Sig Dispense Refill   Blood Glucose Monitoring Suppl (TRUE METRIX METER) W/DEVICE KIT USE TO CHECK BLOOD SUGAR THREE TIMES DAILY AND AT BEDTIME 1 kit 0   glucose blood (TRUE METRIX BLOOD GLUCOSE TEST) test strip USE TO CHECK BLOOD SUGAR THREE TIMES DAILY AND AT BEDTIME 100 each 12   hydrocortisone-pramoxine (ANALPRAM-HC) 2.5-1 % rectal cream PLACE 1 APPLICATION RECTALLY 3 (THREE) TIMES DAILY. 30 g 0   lisinopril-hydrochlorothiazide (ZESTORETIC) 10-12.5 MG tablet TAKE 1 TABLET BY MOUTH DAILY. 30 tablet 6   pantoprazole (PROTONIX) 40 MG tablet TAKE 1 TABLET (40 MG TOTAL) BY MOUTH DAILY. 30 tablet 6   TRUEPLUS LANCETS 28G  MISC USE TO CHECK BLOOD SUGAR THREE TIMES DAILY AND AT BEDTIME 100 each 2   atorvastatin (LIPITOR) 40 MG tablet TAKE 1 TABLET (40 MG TOTAL) BY MOUTH DAILY. 90 tablet 1   cetirizine (ZYRTEC) 10 MG tablet Take 1 tablet (10 mg total) by mouth daily. 30 tablet 3   gabapentin (NEURONTIN) 300 MG capsule Take orally 2 caps in the morning and 3 caps in the evening 150 capsule 6   glipiZIDE (GLUCOTROL) 10 MG tablet TAKE 1 TABLET (10 MG TOTAL) BY MOUTH 2 (TWO) TIMES DAILY BEFORE A MEAL. 180 tablet 1   olopatadine (PATANOL) 0.1 % ophthalmic solution Place 1 drop into both eyes 2 (two) times daily. 5 mL 1   sitaGLIPtin (JANUVIA) 100 MG tablet Take 1 tablet (100 mg total) by mouth daily. 30 tablet 6   dapagliflozin propanediol (FARXIGA) 5 MG TABS tablet TAKE 1 TABLET (5 MG TOTAL) BY MOUTH DAILY BEFORE BREAKFAST. (Patient not taking: Reported on 04/19/2021) 30 tablet 6   No facility-administered medications prior to visit.     ROS Review of Systems  Constitutional:  Negative for activity change, appetite change and fatigue.  HENT:  Positive for ear pain, sinus pressure and sore throat. Negative for congestion.   Eyes:  Negative for visual disturbance.  Respiratory:  Negative for cough, chest tightness, shortness of breath and wheezing.   Cardiovascular:  Negative for chest pain and palpitations.  Gastrointestinal:  Negative for abdominal distention, abdominal pain and constipation.  Endocrine: Negative for polydipsia.  Genitourinary:  Negative for dysuria and frequency.  Musculoskeletal:  Negative for arthralgias and back pain.  Skin:  Negative for rash.  Neurological:  Negative for tremors, light-headedness and numbness.  Hematological:  Does not bruise/bleed easily.  Psychiatric/Behavioral:  Negative for agitation and behavioral problems.    Objective:  BP 125/87    Pulse 96    Ht 5' 5"  (1.651 m)    Wt 207 lb 9.6 oz (94.2 kg)    LMP 06/20/2015 Comment: no chance pregnant,husband has had vasectomy pt  sates   SpO2 98%    BMI 34.55 kg/m   BP/Weight 05/31/2021 05/22/2021 1/63/8466  Systolic BP 599 357 017  Diastolic BP 87 90 79  Wt. (Lbs) 207.6 209.3 210  BMI 34.55 34.83 34.95      Physical Exam Constitutional:      Appearance: She is well-developed.  HENT:     Head:     Comments: Slight sinus tenderness to palpation Cardiovascular:     Rate and Rhythm: Normal rate.     Heart sounds: Normal heart sounds. No murmur heard. Pulmonary:     Effort: Pulmonary effort is normal.     Breath sounds: Normal breath sounds. No wheezing or  rales.  Chest:     Chest wall: No tenderness.  Abdominal:     General: Bowel sounds are normal. There is no distension.     Palpations: Abdomen is soft. There is no mass.     Tenderness: There is no abdominal tenderness.  Musculoskeletal:        General: Normal range of motion.     Right lower leg: No edema.     Left lower leg: No edema.  Neurological:     Mental Status: She is alert and oriented to person, place, and time.  Psychiatric:        Mood and Affect: Mood normal.    CMP Latest Ref Rng & Units 04/19/2021 12/21/2020 05/16/2020  Glucose 70 - 99 mg/dL 173(H) 123(H) 125(H)  BUN 8 - 27 mg/dL 12 14 9   Creatinine 0.57 - 1.00 mg/dL 0.76 0.92 0.80  Sodium 134 - 144 mmol/L 137 141 138  Potassium 3.5 - 5.2 mmol/L 4.1 4.7 5.0  Chloride 96 - 106 mmol/L 101 103 100  CO2 20 - 29 mmol/L - 23 22  Calcium 8.7 - 10.3 mg/dL 10.0 9.5 9.8  Total Protein 6.0 - 8.5 g/dL 7.3 7.8 7.7  Total Bilirubin 0.0 - 1.2 mg/dL 0.5 0.5 0.5  Alkaline Phos 44 - 121 IU/L 139(H) 110 113  AST 0 - 40 IU/L 21 21 23   ALT 0 - 32 IU/L - 18 18    Lipid Panel     Component Value Date/Time   CHOL 192 12/21/2020 1200   TRIG 119 12/21/2020 1200   HDL 65 12/21/2020 1200   CHOLHDL 3.0 12/21/2020 1200   CHOLHDL 3.2 01/19/2016 1043   VLDL 31 (H) 01/19/2016 1043   LDLCALC 106 (H) 12/21/2020 1200    CBC    Component Value Date/Time   WBC 6.9 04/19/2021 1528   WBC 8.4  03/22/2015 0918   RBC 4.90 04/19/2021 1528   RBC 4.61 03/22/2015 0918   HGB 14.5 04/19/2021 1528   HCT 43.4 04/19/2021 1528   PLT 217 04/19/2021 1528   MCV 89 04/19/2021 1528   MCH 29.6 04/19/2021 1528   MCH 29.9 03/22/2015 0918   MCHC 33.4 04/19/2021 1528   MCHC 32.9 03/22/2015 0918   RDW 12.2 04/19/2021 1528   LYMPHSABS 2.6 04/19/2021 1528   MONOABS 0.6 03/22/2015 0918   EOSABS 0.1 04/19/2021 1528   BASOSABS 0.1 04/19/2021 1528    Lab Results  Component Value Date   HGBA1C 7.8 (A) 05/31/2021    Assessment & Plan:  1. Type 2 diabetes mellitus with other specified complication, without long-term current use of insulin (HCC) Suboptimally controlled with A1c of 7.8 which has trended up from 7.5 previously Due to [place] intolerance I have discontinued it. Previously unable to tolerate metformin due to GI side effects Advised that if A1c is not at goal at next visit we may need to consider an injectable-GLP-1 will be preferred but she is not open to this at the moment Continue to work on lifestyle modifications and reassess at next visit Counseled on Diabetic diet, my plate method, 542 minutes of moderate intensity exercise/week Blood sugar logs with fasting goals of 80-120 mg/dl, random of less than 180 and in the event of sugars less than 60 mg/dl or greater than 400 mg/dl encouraged to notify the clinic. Advised on the need for annual eye exams, annual foot exams, Pneumonia vaccine.  - POCT glucose (manual entry) - POCT glycosylated hemoglobin (Hb A1C) - glipiZIDE (GLUCOTROL) 10  MG tablet; TAKE 1 TABLET (10 MG TOTAL) BY MOUTH 2 (TWO) TIMES DAILY BEFORE A MEAL.  Dispense: 180 tablet; Refill: 1 - sitaGLIPtin (JANUVIA) 100 MG tablet; Take 1 tablet (100 mg total) by mouth daily.  Dispense: 90 tablet; Refill: 1 - LP+Non-HDL Cholesterol - Microalbumin/Creatinine Ratio, Urine  2. Hx of colonic polyps 3-year repeat colonoscopy was due in 11/2020 I have placed referral. - Ambulatory  referral to Gastroenterology  3. Pure hypercholesterolemia LDL of 106 slightly above goal of less than 100 Continue with statin and low-cholesterol diet We will check lipid panel at next visit when she is fasting - atorvastatin (LIPITOR) 40 MG tablet; TAKE 1 TABLET (40 MG TOTAL) BY MOUTH DAILY.  Dispense: 90 tablet; Refill: 1  4. Otalgia of both ears Otalgia likely secondary to sinus etiology Placed on antihistamine - cetirizine (ZYRTEC) 10 MG tablet; Take 1 tablet (10 mg total) by mouth daily.  Dispense: 30 tablet; Refill: 3  5. Seasonal allergies - olopatadine (PATANOL) 0.1 % ophthalmic solution; Place 1 drop into both eyes 2 (two) times daily.  Dispense: 5 mL; Refill: 1   Health Care Maintenance: repeat Colonoscopy ordered today due to h/o colonic polyps Meds ordered this encounter  Medications   cetirizine (ZYRTEC) 10 MG tablet    Sig: Take 1 tablet (10 mg total) by mouth daily.    Dispense:  30 tablet    Refill:  3   olopatadine (PATANOL) 0.1 % ophthalmic solution    Sig: Place 1 drop into both eyes 2 (two) times daily.    Dispense:  5 mL    Refill:  1   atorvastatin (LIPITOR) 40 MG tablet    Sig: TAKE 1 TABLET (40 MG TOTAL) BY MOUTH DAILY.    Dispense:  90 tablet    Refill:  1   glipiZIDE (GLUCOTROL) 10 MG tablet    Sig: TAKE 1 TABLET (10 MG TOTAL) BY MOUTH 2 (TWO) TIMES DAILY BEFORE A MEAL.    Dispense:  180 tablet    Refill:  1   sitaGLIPtin (JANUVIA) 100 MG tablet    Sig: Take 1 tablet (100 mg total) by mouth daily.    Dispense:  90 tablet    Refill:  1    Follow-up: Return in about 3 months (around 08/31/2021) for Chronic medical conditions.       Charlott Rakes, MD, FAAFP. [facility] and [facility], [place]   05/31/2021, 12:58 PM

## 2021-05-31 NOTE — Progress Notes (Signed)
Having pain in ears ?Dizziness ?Stopped taking [place]. ?

## 2021-06-01 ENCOUNTER — Other Ambulatory Visit: Payer: Self-pay

## 2021-06-01 LAB — MICROALBUMIN / CREATININE URINE RATIO
Creatinine, Urine: 143.8 mg/dL
Microalb/Creat Ratio: 5 mg/g creat (ref 0–29)
Microalbumin, Urine: 7.2 ug/mL

## 2021-06-01 LAB — LP+NON-HDL CHOLESTEROL
Cholesterol, Total: 164 mg/dL (ref 100–199)
HDL: 65 mg/dL (ref >39)
LDL Chol Calc (NIH): 81 mg/dL (ref 0–99)
Total Non-HDL-Chol (LDL+VLDL): 99 mg/dL (ref 0–129)
Triglycerides: 99 mg/dL (ref 0–149)
VLDL Cholesterol Cal: 18 mg/dL (ref 5–40)

## 2021-07-05 ENCOUNTER — Other Ambulatory Visit: Payer: Self-pay

## 2021-07-05 ENCOUNTER — Other Ambulatory Visit: Payer: Self-pay | Admitting: Family Medicine

## 2021-07-05 DIAGNOSIS — E1149 Type 2 diabetes mellitus with other diabetic neurological complication: Secondary | ICD-10-CM

## 2021-07-05 MED ORDER — GABAPENTIN 300 MG PO CAPS
ORAL_CAPSULE | ORAL | 1 refills | Status: DC
  Filled 2021-07-05: qty 150, 30d supply, fill #0
  Filled 2022-01-04: qty 150, 30d supply, fill #1

## 2021-07-06 ENCOUNTER — Other Ambulatory Visit: Payer: Self-pay

## 2021-07-10 ENCOUNTER — Other Ambulatory Visit: Payer: Self-pay

## 2021-07-30 ENCOUNTER — Encounter: Payer: Self-pay | Admitting: Family Medicine

## 2021-08-01 ENCOUNTER — Other Ambulatory Visit: Payer: Self-pay

## 2021-08-03 ENCOUNTER — Other Ambulatory Visit: Payer: Self-pay | Admitting: Family Medicine

## 2021-08-03 ENCOUNTER — Other Ambulatory Visit: Payer: Self-pay

## 2021-08-03 MED ORDER — TRUE METRIX METER W/DEVICE KIT
PACK | 0 refills | Status: AC
  Filled 2021-08-03: qty 1, 30d supply, fill #0

## 2021-08-29 ENCOUNTER — Ambulatory Visit: Payer: Self-pay | Attending: Family Medicine | Admitting: Family Medicine

## 2021-08-29 ENCOUNTER — Encounter: Payer: Self-pay | Admitting: Family Medicine

## 2021-08-29 VITALS — BP 136/86 | HR 75 | Wt 212.0 lb

## 2021-08-29 DIAGNOSIS — Z6835 Body mass index (BMI) 35.0-35.9, adult: Secondary | ICD-10-CM

## 2021-08-29 DIAGNOSIS — E1169 Type 2 diabetes mellitus with other specified complication: Secondary | ICD-10-CM

## 2021-08-29 LAB — POCT GLYCOSYLATED HEMOGLOBIN (HGB A1C): HbA1c, POC (controlled diabetic range): 7.7 % — AB (ref 0.0–7.0)

## 2021-08-29 LAB — GLUCOSE, POCT (MANUAL RESULT ENTRY): POC Glucose: 168 mg/dl — AB (ref 70–99)

## 2021-08-29 NOTE — Patient Instructions (Addendum)
Please call the Gastroenterologist office to schedule an appointment for your colonoscopy urgently: [facility] [address] [place], [place] [address] PH# [phone]

## 2021-08-29 NOTE — Progress Notes (Signed)
Subjective:  Patient ID: Carmen Burns, female    DOB: July 22, 1960  Age: 61 y.o. MRN: [id number]  CC: Diabetes   HPI Carmen Burns is a 61 y.o. year old female with a history of type 2 diabetes mellitus (A1c 7.7), diabetic neuropathy hypertension, hyperlipidemia, GERD.  Interval History:  She was referred to GI at her last visit due to the fact that she was due for 3-year repeat colonoscopy due to presence of colonic polyps.  Her chart reveals that letter was mailed to her to schedule appointment.  She complains blood sugars have been elevated and fasting gets up to 150. She was previously unable to tolerate both Metformin and Farxiga and remains adherent with Glipizide and Januvia. Neuropathy is controlled on Gabapentin and she is doing well on her antihypertensive and statin. Denies additional concerns today Past Medical History:  Diagnosis Date   Anemia 2013   Diabetes mellitus without complication ([place])    Hyperlipidemia    Hypertension    Refusal of blood transfusions as patient is Jehovah's Witness     Past Surgical History:  Procedure Laterality Date   APPENDECTOMY     DILITATION & CURRETTAGE/HYSTROSCOPY WITH NOVASURE ABLATION N/A 01/12/2013   Procedure: DILATATION & CURETTAGE/HYSTEROSCOPY WITH NOVASURE ABLATION;  Surgeon: Osborne Oman, MD;  Location: [facility];  Service: Gynecology;  Laterality: N/A;    Family History  Problem Relation Age of Onset   Diabetes Father    Stroke Sister    Diabetes Brother    Hypertension Brother    Diabetes Brother    Diabetes Brother    Diabetes Brother    Diabetes Brother    Colon cancer Neg Hx    Breast cancer Neg Hx    Rectal cancer Neg Hx    Stomach cancer Neg Hx    Esophageal cancer Neg Hx     Social History   Socioeconomic History   Marital status: Married    Spouse name: Not on file   Number of children: 3   Years of education: Not on file   Highest education level: 9th grade   Occupational History   Not on file  Tobacco Use   Smoking status: Never   Smokeless tobacco: Never  Vaping Use   Vaping Use: Never used  Substance and Sexual Activity   Alcohol use: Yes    Alcohol/week: 1.0 standard drink    Types: 1 Glasses of wine per week    Comment: socially   Drug use: No   Sexual activity: Yes    Birth control/protection: Surgical    Comment: husband vasectomy  Other Topics Concern   Not on file  Social History Narrative   Not on file   Social Determinants of Health   Financial Resource Strain: Not on file  Food Insecurity: Food Insecurity Present   Worried About [place] in the Last Year: Sometimes true   Ran Out of Food in the Last Year: Sometimes true  Transportation Needs: No Transportation Needs   Lack of Transportation (Medical): No   Lack of Transportation (Non-Medical): No  Physical Activity: Not on file  Stress: Not on file  Social Connections: Not on file    Allergies  Allergen Reactions   Penicillins Itching and Rash    Outpatient Medications Prior to Visit  Medication Sig Dispense Refill   atorvastatin (LIPITOR) 40 MG tablet TAKE 1 TABLET (40 MG TOTAL) BY MOUTH DAILY. 90 tablet 1   Blood Glucose Monitoring Suppl (TRUE  METRIX METER) w/Device KIT USE TO CHECK BLOOD SUGAR THREE TIMES DAILY AND AT BEDTIME 1 kit 0   cetirizine (ZYRTEC) 10 MG tablet Take 1 tablet (10 mg total) by mouth daily. 30 tablet 3   gabapentin (NEURONTIN) 300 MG capsule take 2 capsules by mouth every morning and 3 capsules my mouth in the evening 150 capsule 1   glipiZIDE (GLUCOTROL) 10 MG tablet TAKE 1 TABLET (10 MG TOTAL) BY MOUTH 2 (TWO) TIMES DAILY BEFORE A MEAL. 180 tablet 1   glucose blood (TRUE METRIX BLOOD GLUCOSE TEST) test strip USE TO CHECK BLOOD SUGAR THREE TIMES DAILY AND AT BEDTIME 100 each 12   lisinopril-hydrochlorothiazide (ZESTORETIC) 10-12.5 MG tablet TAKE 1 TABLET BY MOUTH DAILY. 30 tablet 6   olopatadine (PATANOL) 0.1 % ophthalmic  solution Place 1 drop into both eyes 2 (two) times daily. 5 mL 1   pantoprazole (PROTONIX) 40 MG tablet TAKE 1 TABLET (40 MG TOTAL) BY MOUTH DAILY. 30 tablet 6   sitaGLIPtin (JANUVIA) 100 MG tablet Take 1 tablet (100 mg total) by mouth daily. 90 tablet 1   TRUEPLUS LANCETS 28G MISC USE TO CHECK BLOOD SUGAR THREE TIMES DAILY AND AT BEDTIME 100 each 2   No facility-administered medications prior to visit.     ROS Review of Systems  Constitutional:  Negative for activity change and appetite change.  HENT:  Negative for sinus pressure and sore throat.   Respiratory:  Negative for chest tightness, shortness of breath and wheezing.   Cardiovascular:  Negative for chest pain and palpitations.  Gastrointestinal:  Negative for abdominal distention, abdominal pain and constipation.  Genitourinary: Negative.   Musculoskeletal: Negative.   Psychiatric/Behavioral:  Negative for behavioral problems and dysphoric mood.    Objective:  BP 136/86   Pulse 75   Wt 212 lb (96.2 kg)   LMP 06/20/2015 Comment: no chance pregnant,husband has had vasectomy pt sates  SpO2 99%   BMI 35.28 kg/m      08/29/2021    9:36 AM 05/31/2021   10:38 AM 05/22/2021   12:31 PM  BP/Weight  Systolic BP 557 322 025  Diastolic BP 86 87 90  Wt. (Lbs) 212 207.6 209.3  BMI 35.28 kg/m2 34.55 kg/m2 34.83 kg/m2      Physical Exam Constitutional:      Appearance: She is well-developed.  Cardiovascular:     Rate and Rhythm: Normal rate.     Heart sounds: Normal heart sounds. No murmur heard. Pulmonary:     Effort: Pulmonary effort is normal.     Breath sounds: Normal breath sounds. No wheezing or rales.  Chest:     Chest wall: No tenderness.  Abdominal:     General: Bowel sounds are normal. There is no distension.     Palpations: Abdomen is soft. There is no mass.     Tenderness: There is no abdominal tenderness.  Musculoskeletal:        General: Normal range of motion.     Right lower leg: No edema.     Left  lower leg: No edema.  Neurological:     Mental Status: She is alert and oriented to person, place, and time.  Psychiatric:        Mood and Affect: Mood normal.       Latest Ref Rng & Units 04/19/2021    3:28 PM 12/21/2020   12:00 PM 05/16/2020   11:51 AM  CMP  Glucose 70 - 99 mg/dL 173   123  125    BUN 8 - 27 mg/dL 12   14   9     Creatinine 0.57 - 1.00 mg/dL 0.76   0.92   0.80    Sodium 134 - 144 mmol/L 137   141   138    Potassium 3.5 - 5.2 mmol/L 4.1   4.7   5.0    Chloride 96 - 106 mmol/L 101   103   100    CO2 20 - 29 mmol/L  23   22    Calcium 8.7 - 10.3 mg/dL 10.0   9.5   9.8    Total Protein 6.0 - 8.5 g/dL 7.3   7.8   7.7    Total Bilirubin 0.0 - 1.2 mg/dL 0.5   0.5   0.5    Alkaline Phos 44 - 121 IU/L 139   110   113    AST 0 - 40 IU/L 21   21   23     ALT 0 - 32 IU/L  18   18      Lipid Panel     Component Value Date/Time   CHOL 164 05/31/2021 1139   TRIG 99 05/31/2021 1139   HDL 65 05/31/2021 1139   CHOLHDL 3.0 12/21/2020 1200   CHOLHDL 3.2 01/19/2016 1043   VLDL 31 (H) 01/19/2016 1043   LDLCALC 81 05/31/2021 1139    CBC    Component Value Date/Time   WBC 6.9 04/19/2021 1528   WBC 8.4 03/22/2015 0918   RBC 4.90 04/19/2021 1528   RBC 4.61 03/22/2015 0918   HGB 14.5 04/19/2021 1528   HCT 43.4 04/19/2021 1528   PLT 217 04/19/2021 1528   MCV 89 04/19/2021 1528   MCH 29.6 04/19/2021 1528   MCH 29.9 03/22/2015 0918   MCHC 33.4 04/19/2021 1528   MCHC 32.9 03/22/2015 0918   RDW 12.2 04/19/2021 1528   LYMPHSABS 2.6 04/19/2021 1528   MONOABS 0.6 03/22/2015 0918   EOSABS 0.1 04/19/2021 1528   BASOSABS 0.1 04/19/2021 1528    Lab Results  Component Value Date   HGBA1C 7.7 (A) 08/29/2021    Assessment & Plan:  1. Type 2 diabetes mellitus with other specified complication, without long-term current use of insulin (HCC) Not fully optimized with A1c of 7.7; goal is <7.0 Discussed adding a GLP 1 RA for better optimization which she is opposed to as she does  not want an injectable Unable to tolerate Metformin and SGLTi Counseled on Diabetic diet, my plate method, 106 minutes of moderate intensity exercise/week Blood sugar logs with fasting goals of 80-120 mg/dl, random of less than 180 and in the event of sugars less than 60 mg/dl or greater than 400 mg/dl encouraged to notify the clinic. Advised on the need for annual eye exams, annual foot exams, Pneumonia vaccine. - POCT glucose (manual entry) - POCT glycosylated hemoglobin (Hb A1C)  2. Class 2 severe obesity due to excess calories with serious comorbidity and body mass index (BMI) of 35.0 to 35.9 in adult [facility]) GLP 1RA will be beneficial but she is opposed to this She states she will work on lifestyle modification instead   Health Care Maintenance: Due for Colonoscopy and has been provided with GI phone numbers so she can call to schedule as they have been unsuccessful at reaching her. No orders of the defined types were placed in this encounter.   Follow-up: Return in about 6 months (around 02/28/2022) for Chronic medical conditions.  Charlott Rakes, MD, FAAFP. [facility] and [place] [facility], [place]   08/29/2021, 9:59 AM

## 2021-08-31 ENCOUNTER — Encounter: Payer: Self-pay | Admitting: Gastroenterology

## 2021-09-04 ENCOUNTER — Other Ambulatory Visit: Payer: Self-pay

## 2021-10-02 ENCOUNTER — Ambulatory Visit (AMBULATORY_SURGERY_CENTER): Payer: Self-pay | Admitting: *Deleted

## 2021-10-02 ENCOUNTER — Other Ambulatory Visit: Payer: Self-pay | Admitting: Family Medicine

## 2021-10-02 ENCOUNTER — Other Ambulatory Visit: Payer: Self-pay

## 2021-10-02 VITALS — Ht 65.0 in | Wt 216.0 lb

## 2021-10-02 DIAGNOSIS — Z86010 Personal history of colonic polyps: Secondary | ICD-10-CM

## 2021-10-02 DIAGNOSIS — E1159 Type 2 diabetes mellitus with other circulatory complications: Secondary | ICD-10-CM

## 2021-10-02 MED ORDER — PLENVU 140 G PO SOLR: 1.0000 | ORAL | 0 refills | Status: DC

## 2021-10-02 NOTE — Progress Notes (Signed)
No egg or soy allergy known to patient  issues known to pt with past sedation with any surgeries or procedures of PONV  Patient denies ever being told they had issues or difficulty with intubation  No FH of Malignant Hyperthermia Pt is not on diet pills Pt is not on  home 02  Pt is not on blood thinners  Pt states issues with constipation - uses Metamucil - she has a BM daily- sometimes hard, others soft - she eat papaya and that helps - pt instructed on daily Miralax starting Sunday 7-30 No A fib or A flutter Have any cardiac testing pending--pt denies  Interpreter Shanon Brow, Present in [place] with pt today.  Pt verbalized understanding of instructions that were given in Spanish   Plenvu sample to pt due to [place] assistance- Plenvu sample LOT [phone]  EXP 05-2022

## 2021-10-03 ENCOUNTER — Other Ambulatory Visit: Payer: Self-pay

## 2021-10-03 MED ORDER — LISINOPRIL-HYDROCHLOROTHIAZIDE 10-12.5 MG PO TABS
1.0000 | ORAL_TABLET | Freq: Every day | ORAL | 6 refills | Status: DC
  Filled 2021-10-03 – 2021-10-22 (×2): qty 30, 30d supply, fill #0
  Filled 2021-12-06: qty 30, 30d supply, fill #1
  Filled 2022-01-04: qty 30, 30d supply, fill #2
  Filled 2022-01-30: qty 30, 30d supply, fill #3
  Filled 2022-02-28: qty 30, 30d supply, fill #4
  Filled 2022-04-03: qty 30, 30d supply, fill #5
  Filled 2022-05-10: qty 30, 30d supply, fill #6

## 2021-10-03 NOTE — Telephone Encounter (Signed)
Requested Prescriptions  Pending Prescriptions Disp Refills  . lisinopril-hydrochlorothiazide (ZESTORETIC) 10-12.5 MG tablet 30 tablet 6    Sig: TAKE 1 TABLET BY MOUTH DAILY.     Cardiovascular:  ACEI + Diuretic Combos Passed - 10/02/2021 11:16 AM      Passed - Na in normal range and within 180 days    Sodium  Date Value Ref Range Status  04/19/2021 137 134 - 144 mmol/L Final         Passed - K in normal range and within 180 days    Potassium  Date Value Ref Range Status  04/19/2021 4.1 3.5 - 5.2 mmol/L Final         Passed - Cr in normal range and within 180 days    Creat  Date Value Ref Range Status  01/19/2016 0.88 0.50 - 1.05 mg/dL Final    Comment:      For patients > or = 63 years of age: The upper reference limit for Creatinine is approximately 13% higher for people identified as African-American.      Creatinine, Ser  Date Value Ref Range Status  04/19/2021 0.76 0.57 - 1.00 mg/dL Final   Creatinine, Urine  Date Value Ref Range Status  01/19/2016 60 20 - 320 mg/dL Final         Passed - eGFR is 30 or above and within 180 days    GFR, Est African American  Date Value Ref Range Status  01/19/2016 85 >=60 mL/min Final   GFR calc Af Amer  Date Value Ref Range Status  05/16/2020 92 >59 mL/min/1.73 Final    Comment:    **In accordance with recommendations from the NKF-ASN Task force,**   [facility] is in the process of updating its eGFR calculation to the   2021 CKD-EPI creatinine equation that estimates kidney function   without a race variable.    GFR, Est Non African American  Date Value Ref Range Status  01/19/2016 74 >=60 mL/min Final   GFR calc non Af Amer  Date Value Ref Range Status  05/16/2020 80 >59 mL/min/1.73 Final   eGFR  Date Value Ref Range Status  04/19/2021 89 >59 mL/min/1.73 Final         Passed - Patient is not pregnant      Passed - Last BP in normal range    BP Readings from Last 1 Encounters:  08/29/21 136/86         Passed  - Valid encounter within last 6 months    Recent Outpatient Visits          1 month ago Type 2 diabetes mellitus with other specified complication, without long-term current use of insulin ([place])   [place], [place], MD   4 months ago Type 2 diabetes mellitus with other specified complication, without long-term current use of insulin ([place])   [place], [place], MD   7 months ago Type 2 diabetes mellitus with other specified complication, without long-term current use of insulin ([place])   [place], [place], MD   9 months ago Type 2 diabetes mellitus with other specified complication, without long-term current use of insulin ([place])   [facility] Charlott Rakes, MD   1 year ago Annual physical exam   [place], [place], MD      Future Appointments  In 4 months Charlott Rakes, MD [place]

## 2021-10-09 ENCOUNTER — Other Ambulatory Visit: Payer: Self-pay

## 2021-10-22 ENCOUNTER — Other Ambulatory Visit: Payer: Self-pay

## 2021-10-26 ENCOUNTER — Encounter: Payer: Self-pay | Admitting: Gastroenterology

## 2021-10-26 ENCOUNTER — Ambulatory Visit (AMBULATORY_SURGERY_CENTER): Payer: Self-pay | Admitting: Gastroenterology

## 2021-10-26 VITALS — BP 127/76 | HR 73 | Temp 98.6°F | Resp 17 | Ht 65.0 in | Wt 216.0 lb

## 2021-10-26 DIAGNOSIS — Z09 Encounter for follow-up examination after completed treatment for conditions other than malignant neoplasm: Secondary | ICD-10-CM

## 2021-10-26 DIAGNOSIS — D120 Benign neoplasm of cecum: Secondary | ICD-10-CM

## 2021-10-26 DIAGNOSIS — K635 Polyp of colon: Secondary | ICD-10-CM

## 2021-10-26 DIAGNOSIS — Z86010 Personal history of colonic polyps: Secondary | ICD-10-CM

## 2021-10-26 MED ORDER — SODIUM CHLORIDE 0.9 % IV SOLN: 500.0000 mL | INTRAVENOUS | Status: DC

## 2021-10-26 NOTE — Progress Notes (Signed)
Called to room to assist during endoscopic procedure.  Patient ID and intended procedure confirmed with present staff. Received instructions for my participation in the procedure from the performing physician.  

## 2021-10-26 NOTE — Progress Notes (Signed)
PT taken to PACU. Monitors in place. VSS. Report given to RN. 

## 2021-10-26 NOTE — Patient Instructions (Addendum)
YOU HAD AN ENDOSCOPIC PROCEDURE TODAY AT [facility]:   Refer to the procedure report that was given to you for any specific questions about what was found during the examination.  If the procedure report does not answer your questions, please call your gastroenterologist to clarify.  If you requested that your care partner not be given the details of your procedure findings, then the procedure report has been included in a sealed envelope for you to review at your convenience later.  YOU SHOULD EXPECT: Some feelings of bloating in the abdomen. Passage of more gas than usual.  Walking can help get rid of the air that was put into your GI tract during the procedure and reduce the bloating. If you had a lower endoscopy (such as a colonoscopy or flexible sigmoidoscopy) you may notice spotting of blood in your stool or on the toilet paper. If you underwent a bowel prep for your procedure, you may not have a normal bowel movement for a few days.  Please Note:  You might notice some irritation and congestion in your nose or some drainage.  This is from the oxygen used during your procedure.  There is no need for concern and it should clear up in a day or so.  SYMPTOMS TO REPORT IMMEDIATELY:  Following lower endoscopy (colonoscopy or flexible sigmoidoscopy):  Excessive amounts of blood in the stool  Significant tenderness or worsening of abdominal pains  Swelling of the abdomen that is new, acute  Fever of 100F or higher   For urgent or emergent issues, a gastroenterologist can be reached at any hour by calling [phone]. Do not use MyChart messaging for urgent concerns.    DIET:  We do recommend a small meal at first, but then you may proceed to your regular diet.  Drink plenty of fluids but you should avoid alcoholic beverages for 24 hours.  MEDICATIONS: Continue present medications.  Please see handouts given to you by your recovery nurse.  Thank you for allowing [place] to  provide for your healthcare needs today.  ACTIVITY:  You should plan to take it easy for the rest of today and you should NOT DRIVE or use heavy machinery until tomorrow (because of the sedation medicines used during the test).    FOLLOW UP: Our staff will call the number listed on your records the next business day following your procedure.  We will call around 7:15- 8:00 am to check on you and address any questions or concerns that you may have regarding the information given to you following your procedure. If we do not reach you, we will leave a message.  If you develop any symptoms (ie: fever, flu-like symptoms, shortness of breath, cough etc.) before then, please call [phone].  If you test positive for Covid 19 in the 2 weeks post procedure, please call and report this information to [place].    If any biopsies were taken you will be contacted by phone or by letter within the next 1-3 weeks.  Please call us at [phone] if you have not heard about the biopsies in 3 weeks.    SIGNATURES/CONFIDENTIALITY: You and/or your care partner have signed paperwork which will be entered into your electronic medical record.  These signatures attest to the fact that that the information above on your After Visit Summary has been reviewed and is understood.  Full responsibility of the confidentiality of this discharge information lies with you and/or your care-partner. [place]  ENDOSCPICO HOY EN [facility]:   Lea el informe del procedimiento que se le entreg para cualquier pregunta especfica sobre lo que se Primary school teacher.  Si el informe del examen no responde a sus preguntas, por favor llame a su gastroenterlogo para aclararlo.  Si usted solicit que no se le den [employer] de lo que se Estate manager/land agent en su procedimiento al [employer] va a cuidar, entonces el informe del procedimiento se ha incluido en un sobre sellado para que usted lo revise despus  cuando le sea ms conveniente.   LO QUE PUEDE ESPERAR: Algunas sensaciones de hinchazn en el abdomen.  Puede tener ms gases de lo normal.  El caminar puede ayudarle a eliminar el aire que se le puso en el tracto gastrointestinal durante el procedimiento y reducir la hinchazn.  Si le hicieron una endoscopia inferior (como una colonoscopia o una sigmoidoscopia flexible), podra notar manchas de sangre en las heces fecales o en el papel higinico.  Si se someti a una preparacin intestinal para su procedimiento, es posible que no tenga una evacuacin intestinal normal durante [employer].   Tenga en cuenta:  Es posible que note un poco de irritacin y congestin en la nariz o algn drenaje.  Esto es debido al oxgeno [employer] durante su procedimiento.  No hay que preocuparse y esto debe desaparecer ms o Scientist, research (medical).   SNTOMAS PARA REPORTAR INMEDIATAMENTE:  Despus de una endoscopia inferior (colonoscopia o sigmoidoscopia flexible):  Cantidades excesivas de sangre en las heces fecales  Sensibilidad significativa o empeoramiento de los dolores abdominales   Hinchazn aguda del abdomen que antes no tena   Fiebre de 100F o ms   Despus de la endoscopia superior (EGD)  Vmitos de Biochemist, clinical o material como caf molido   Dolor en el pecho o dolor debajo de los omplatos que antes no tena   Dolor o dificultad persistente para tragar  Falta de aire que antes no tena   Fiebre de 100F o ms  Heces fecales negras y pegajosas   Para asuntos urgentes o de Freight forwarder, puede comunicarse con un gastroenterlogo a cualquier hora llamando al [phone].  DIETA:  Recomendamos una comida pequea al principio, pero luego puede continuar con su dieta normal.  Tome muchos lquidos, Teacher, adult education las bebidas alcohlicas durante 24 horas.    ACTIVIDAD:  Debe planear tomarse las cosas con calma por el resto del da y no debe CONDUCIR ni usar maquinaria pesada Programmer, applications (debido a los medicamentos  de sedacin utilizados durante el examen).     SEGUIMIENTO: Nuestro personal llamar al nmero que aparece en su historial al siguiente da hbil de su procedimiento para ver cmo se siente y para responder cualquier pregunta o inquietud que pueda tener con respecto a la informacin que se le dio despus del procedimiento. Si no podemos contactarle, le dejaremos un mensaje.  Sin embargo, si se siente bien y no tiene Paediatric nurse, no es necesario que nos devuelva la llamada.  Asumiremos que ha regresado a sus actividades diarias normales sin incidentes. Si se le tomaron algunas biopsias, le contactaremos por telfono o por carta en las prximas 3 semanas.  Si no ha sabido [employer] biopsias en el transcurso de 3 semanas, por favor llmenos al [phone].   FIRMAS/CONFIDENCIALIDAD: Usted y/o el acompaante que le cuide han firmado documentos que se ingresarn en su historial mdico Emergency planning/management officer.  Estas firmas atestiguan el hecho de que la  informacin anterior

## 2021-10-26 NOTE — Progress Notes (Signed)
[facility] History and Physical   Primary Care Physician:  Charlott Rakes, MD   Reason for Procedure:   History of colon polyps  Plan:    colonoscopy     HPI: Carmen Burns is a 61 y.o. female  here for colonoscopy surveillance - multiple polyps removed in the past as well as difficult polypectomy of the AO in years past. Last exam 11/2017. No family history of colon cancer known. Otherwise feels well without any cardiopulmonary symptoms.   I have discussed risks / benefits of anesthesia and endoscopic procedure with Richardson Chiquito and they wish to proceed with the exams as outlined today.    Past Medical History:  Diagnosis Date   Allergy    seasonal   Anemia 03/26/2011   Diabetes mellitus without complication (HCC)    GERD (gastroesophageal reflux disease)    Hyperlipidemia    Hypertension    Neuromuscular disorder (HCC)    neuropathy   PONV (postoperative nausea and vomiting)    Refusal of blood transfusions as patient is Jehovah's Witness     Past Surgical History:  Procedure Laterality Date   APPENDECTOMY     COLONOSCOPY     DILITATION & CURRETTAGE/HYSTROSCOPY WITH NOVASURE ABLATION N/A 01/12/2013   Procedure: DILATATION & CURETTAGE/HYSTEROSCOPY WITH NOVASURE ABLATION;  Surgeon: Osborne Oman, MD;  Location: [facility];  Service: Gynecology;  Laterality: N/A;   POLYPECTOMY      Prior to Admission medications   Medication Sig Start Date End Date Taking? Authorizing Provider  atorvastatin (LIPITOR) 40 MG tablet TAKE 1 TABLET (40 MG TOTAL) BY MOUTH DAILY. 05/31/21 05/31/22 Yes Charlott Rakes, MD  gabapentin (NEURONTIN) 300 MG capsule take 2 capsules by mouth every morning and 3 capsules my mouth in the evening 07/05/21  Yes Ladell Pier, MD  glipiZIDE (GLUCOTROL) 10 MG tablet TAKE 1 TABLET (10 MG TOTAL) BY MOUTH 2 (TWO) TIMES DAILY BEFORE A MEAL. 05/31/21 05/31/22 Yes Newlin, Enobong, MD  lisinopril-hydrochlorothiazide (ZESTORETIC)  10-12.5 MG tablet TAKE 1 TABLET BY MOUTH DAILY. 10/03/21 11/21/21 Yes Newlin, Charlane Ferretti, MD  olopatadine (PATANOL) 0.1 % ophthalmic solution Place 1 drop into both eyes 2 (two) times daily. 05/31/21  Yes Newlin, Enobong, MD  pantoprazole (PROTONIX) 40 MG tablet TAKE 1 TABLET (40 MG TOTAL) BY MOUTH DAILY. 03/01/21 03/01/22 Yes Newlin, Charlane Ferretti, MD  Psyllium (METAMUCIL) 28.3 % POWD metamucil s/f s/text orange 30 dose 6.1 oz  DISSOLVE ONE TEASPOONFUL IN EACH NOSTRIL EIGHT OUNCE OF WATER AND BEBER 1-3 VECES AL DIA   Yes [provider]  sitaGLIPtin (JANUVIA) 100 MG tablet Take 1 tablet (100 mg total) by mouth daily. 05/31/21 11/01/21 Yes Charlott Rakes, MD  Blood Glucose Monitoring Suppl (TRUE METRIX METER) w/Device KIT USE TO CHECK BLOOD SUGAR THREE TIMES DAILY AND AT BEDTIME 08/03/21   Charlott Rakes, MD  cetirizine (ZYRTEC) 10 MG tablet Take 1 tablet (10 mg total) by mouth daily. 05/31/21   Charlott Rakes, MD  glucose blood (TRUE METRIX BLOOD GLUCOSE TEST) test strip USE TO CHECK BLOOD SUGAR THREE TIMES DAILY AND AT BEDTIME 09/06/20   Charlott Rakes, MD  PEG-KCl-NaCl-NaSulf-Na Asc-C (PLENVU) 140 g SOLR Take 1 kit by mouth as directed. 10/02/21   Ailie Gage, Carlota Raspberry, MD  TRUEPLUS LANCETS 28G MISC USE TO CHECK BLOOD SUGAR THREE TIMES DAILY AND AT BEDTIME 03/16/15   Lance Bosch, NP  metFORMIN (GLUCOPHAGE-XR) 500 MG 24 hr tablet Take 2 tablets (1,000 mg total) by mouth 2 (two) times daily. 2 tabs  daily 12/15/19 05/16/20  Charlott Rakes, MD    Current Outpatient Medications  Medication Sig Dispense Refill   atorvastatin (LIPITOR) 40 MG tablet TAKE 1 TABLET (40 MG TOTAL) BY MOUTH DAILY. 90 tablet 1   gabapentin (NEURONTIN) 300 MG capsule take 2 capsules by mouth every morning and 3 capsules my mouth in the evening 150 capsule 1   glipiZIDE (GLUCOTROL) 10 MG tablet TAKE 1 TABLET (10 MG TOTAL) BY MOUTH 2 (TWO) TIMES DAILY BEFORE A MEAL. 180 tablet 1   lisinopril-hydrochlorothiazide (ZESTORETIC) 10-12.5 MG  tablet TAKE 1 TABLET BY MOUTH DAILY. 30 tablet 6   olopatadine (PATANOL) 0.1 % ophthalmic solution Place 1 drop into both eyes 2 (two) times daily. 5 mL 1   pantoprazole (PROTONIX) 40 MG tablet TAKE 1 TABLET (40 MG TOTAL) BY MOUTH DAILY. 30 tablet 6   Psyllium (METAMUCIL) 28.3 % POWD metamucil s/f s/text orange 30 dose 6.1 oz  DISSOLVE ONE TEASPOONFUL IN EACH NOSTRIL EIGHT OUNCE OF WATER AND BEBER 1-3 VECES AL DIA     sitaGLIPtin (JANUVIA) 100 MG tablet Take 1 tablet (100 mg total) by mouth daily. 90 tablet 1   Blood Glucose Monitoring Suppl (TRUE METRIX METER) w/Device KIT USE TO CHECK BLOOD SUGAR THREE TIMES DAILY AND AT BEDTIME 1 kit 0   cetirizine (ZYRTEC) 10 MG tablet Take 1 tablet (10 mg total) by mouth daily. 30 tablet 3   glucose blood (TRUE METRIX BLOOD GLUCOSE TEST) test strip USE TO CHECK BLOOD SUGAR THREE TIMES DAILY AND AT BEDTIME 100 each 12   PEG-KCl-NaCl-NaSulf-Na Asc-C (PLENVU) 140 g SOLR Take 1 kit by mouth as directed. 1 each 0   TRUEPLUS LANCETS 28G MISC USE TO CHECK BLOOD SUGAR THREE TIMES DAILY AND AT BEDTIME 100 each 2   Current Facility-Administered Medications  Medication Dose Route Frequency Provider Last Rate Last Admin   0.9 %  sodium chloride infusion  500 mL Intravenous Continuous Tymarion Everard, Carlota Raspberry, MD        Allergies as of 10/26/2021 - Review Complete 10/26/2021  Allergen Reaction Noted   Penicillins Itching and Rash 10/04/2010    Family History  Problem Relation Age of Onset   Diabetes Father    Stroke Sister    Diabetes Brother    Hypertension Brother    Diabetes Brother    Diabetes Brother    Diabetes Brother    Diabetes Brother    Colon cancer Neg Hx    Breast cancer Neg Hx    Rectal cancer Neg Hx    Stomach cancer Neg Hx    Esophageal cancer Neg Hx    Colon polyps Neg Hx     Social History   Socioeconomic History   Marital status: Married    Spouse name: Not on file   Number of children: 3   Years of education: Not on file    Highest education level: 9th grade  Occupational History   Not on file  Tobacco Use   Smoking status: Never   Smokeless tobacco: Never  Vaping Use   Vaping Use: Never used  Substance and Sexual Activity   Alcohol use: Yes    Alcohol/week: 1.0 standard drink of alcohol    Types: 1 Glasses of wine per week    Comment: socially   Drug use: No   Sexual activity: Yes    Birth control/protection: Surgical    Comment: husband vasectomy  Other Topics Concern   Not on file  Social History Narrative  Not on file   Social Determinants of Health   Financial Resource Strain: Not on file  Food Insecurity: Food Insecurity Present (05/22/2021)   Hunger Vital Sign    Worried About Running Out of Food in the Last Year: Sometimes true    Ran Out of Food in the Last Year: Sometimes true  Transportation Needs: No Transportation Needs (05/22/2021)   PRAPARE - Hydrologist (Medical): No    Lack of Transportation (Non-Medical): No  Physical Activity: Not on file  Stress: Not on file  Social Connections: Not on file  Intimate Partner Violence: Not on file    Review of Systems: All other review of systems negative except as mentioned in the HPI.  Physical Exam: Vital signs BP 118/63   Pulse 85   Temp 98.6 F (37 C)   Ht 5' 5"  (1.651 m)   Wt 216 lb (98 kg)   LMP 06/20/2015 Comment: no chance pregnant,husband has had vasectomy pt sates  SpO2 97%   BMI 35.94 kg/m   General:   Alert,  Well-developed, pleasant and cooperative in NAD Lungs:  Clear throughout to auscultation.   Heart:  Regular rate and rhythm Abdomen:  Soft, nontender and nondistended.   Neuro/Psych:  Alert and cooperative. Normal mood and affect. A and O x 3  Jolly Mango, MD [facility]

## 2021-10-26 NOTE — Op Note (Signed)
[facility] Patient Name: Carmen Burns Procedure Date: 10/26/2021 11:10 AM MRN: [id number] Endoscopist: Remo Lipps P. Havery Moros , MD Age: 61 Referring MD:  Date of Birth: 1961/02/11 Gender: Female Account #: [id number] Procedure:                Colonoscopy Indications:              High risk colon cancer surveillance: Personal                            history of colonic polyps - difficult polypectomy                            at the AO in the past (patient is already s/p                            appendectomy) - last exam 11/2017 Medicines:                Monitored Anesthesia Care Procedure:                Pre-Anesthesia Assessment:                           - Prior to the procedure, a History and Physical                            was performed, and patient medications and                            allergies were reviewed. The patient's tolerance of                            previous anesthesia was also reviewed. The risks                            and benefits of the procedure and the sedation                            options and risks were discussed with the patient.                            All questions were answered, and informed consent                            was obtained. Prior Anticoagulants: The patient has                            taken no previous anticoagulant or antiplatelet                            agents. ASA Grade Assessment: II - A patient with                            mild systemic disease. After reviewing the risks  and benefits, the patient was deemed in                            satisfactory condition to undergo the procedure.                           After obtaining informed consent, the colonoscope                            was passed under direct vision. Throughout the                            procedure, the patient's blood pressure, pulse, and                            oxygen saturations  were monitored continuously. The                            Olympus CF-HQ190L (Serial# [id number]) Colonoscope was                            introduced through the anus and advanced to the the                            cecum, identified by appendiceal orifice and                            ileocecal valve. The colonoscopy was performed                            without difficulty. The patient tolerated the                            procedure well. The quality of the bowel                            preparation was good. The ileocecal valve,                            appendiceal orifice, and rectum were photographed. Scope In: 11:21:22 AM Scope Out: 11:46:05 AM Scope Withdrawal Time: 0 hours 19 minutes 26 seconds  Total Procedure Duration: 0 hours 24 minutes 43 seconds  Findings:                 The perianal and digital rectal examinations were                            normal.                           A potential polypoid lesion vs. normal lymphoid /                            reactive tissue was found at the appendiceal  orifice. The lesion was flat. The area was samples                            and mostly removed with a piecemeal technique using                            a cold snare to ensure no residual / recurrent                            polypoid tissue. Resection and retrieval were                            complete.                           A single medium-mouthed diverticulum was found in                            the cecum.                           Internal hemorrhoids were found during                            retroflexion. The hemorrhoids were small.                           The exam was otherwise without abnormality. Complications:            No immediate complications. Estimated blood loss:                            Minimal. Estimated Blood Loss:     Estimated blood loss was minimal. Impression:               - Possible polypoid  lesion vs.benign lymphoid                            tissue / reactive change at the appendiceal                            orifice. Sampled with cold snare / mostly removed                           - Diverticulosis in the cecum.                           - Internal hemorrhoids.                           - The examination was otherwise normal. Recommendation:           - Patient has a contact number available for                            emergencies. The signs and symptoms of potential  delayed complications were discussed with the                            patient. Return to normal activities tomorrow.                            Written discharge instructions were provided to the                            patient.                           - Resume previous diet.                           - Continue present medications.                           - Await pathology results with further                            recommendations Remo Lipps P. Aleiyah Halpin, MD 10/26/2021 11:52:06 AM This report has been signed electronically.

## 2021-10-29 ENCOUNTER — Telehealth: Payer: Self-pay | Admitting: *Deleted

## 2021-10-29 NOTE — Telephone Encounter (Signed)
  Follow up Call-     10/26/2021   10:16 AM  Call back number  Post procedure Call Back phone  # [phone] Hassan Rowan, daughter in law)  Permission to leave phone message Yes     Patient questions:   Busy signal.

## 2021-10-31 ENCOUNTER — Other Ambulatory Visit: Payer: Self-pay | Admitting: Family Medicine

## 2021-10-31 ENCOUNTER — Other Ambulatory Visit: Payer: Self-pay

## 2021-10-31 DIAGNOSIS — E1169 Type 2 diabetes mellitus with other specified complication: Secondary | ICD-10-CM

## 2021-10-31 NOTE — Telephone Encounter (Signed)
Requested Prescriptions  Pending Prescriptions Disp Refills  . JANUVIA 100 MG tablet [Pharmacy Med Name: JANUVIA '100MG'$  TAB] 90 tablet 01    Sig: TAKE ONE TABLET BY MOUTH EVERY DAY     Endocrinology:  Diabetes - DPP-4 Inhibitors Passed - 10/31/2021  4:06 PM      Passed - HBA1C is between 0 and 7.9 and within 180 days    HbA1c, POC (controlled diabetic range)  Date Value Ref Range Status  08/29/2021 7.7 (A) 0.0 - 7.0 % Final         Passed - Cr in normal range and within 360 days    Creat  Date Value Ref Range Status  01/19/2016 0.88 0.50 - 1.05 mg/dL Final    Comment:      For patients > or = 63 years of age: The upper reference limit for Creatinine is approximately 13% higher for people identified as African-American.      Creatinine, Ser  Date Value Ref Range Status  04/19/2021 0.76 0.57 - 1.00 mg/dL Final   Creatinine, Urine  Date Value Ref Range Status  01/19/2016 60 20 - 320 mg/dL Final         Passed - Valid encounter within last 6 months    Recent Outpatient Visits          2 months ago Type 2 diabetes mellitus with other specified complication, without long-term current use of insulin ([place])   [place], [place], MD   5 months ago Type 2 diabetes mellitus with other specified complication, without long-term current use of insulin ([place])   [place], [place], MD   8 months ago Type 2 diabetes mellitus with other specified complication, without long-term current use of insulin ([place])   [place], [place], MD   10 months ago Type 2 diabetes mellitus with other specified complication, without long-term current use of insulin ([place])   [facility] Charlott Rakes, MD   1 year ago Annual physical exam   [place], Enobong, MD      Future Appointments            In 4 months Charlott Rakes,  MD [place]

## 2021-11-01 ENCOUNTER — Encounter: Payer: Self-pay | Admitting: Gastroenterology

## 2021-11-02 ENCOUNTER — Other Ambulatory Visit: Payer: Self-pay

## 2021-11-05 ENCOUNTER — Other Ambulatory Visit: Payer: Self-pay | Admitting: Family Medicine

## 2021-11-05 ENCOUNTER — Other Ambulatory Visit: Payer: Self-pay

## 2021-11-05 DIAGNOSIS — E1169 Type 2 diabetes mellitus with other specified complication: Secondary | ICD-10-CM

## 2021-11-05 MED ORDER — SITAGLIPTIN PHOSPHATE 100 MG PO TABS
100.0000 mg | ORAL_TABLET | Freq: Every day | ORAL | 3 refills | Status: DC
  Filled 2021-11-05: qty 90, 90d supply, fill #0
  Filled 2022-01-30: qty 30, 30d supply, fill #1

## 2021-11-07 ENCOUNTER — Other Ambulatory Visit: Payer: Self-pay

## 2021-11-07 ENCOUNTER — Other Ambulatory Visit: Payer: Self-pay | Admitting: Pharmacist

## 2021-11-07 NOTE — Chronic Care Management (AMB) (Signed)
Patient seen by Berdie Ogren, PharmD Candidate on 11/07/2021 while they were picking up prescriptions at [address] at [facility]   Blood pressure today was : 140/80, HR 70; on recheck (if >140 or >90): 131/81, HR 71   Patient has an automated home blood pressure machine. Patient does not check regularly.   Medication review was performed. They are taking medications as prescribed.   The following barriers to adherence were noted:  - Denies concerns with medication access or understanding.   The following interventions were completed:  - Medications were reviewed  - Patient was educated on medications, including indication and administration  - Patient was educated on proper technique to check home blood pressure and reminded to bring home machine and readings to next provider appointment. Counseled to take home BP at least once daily and log her values.  - Patient was counseled on lifestyle modifications to improve blood pressure   The patient has follow up scheduled:  PCP: 02/28/2022 at 10:30 AM   Catie TJodi Mourning, PharmD, [place]  [phone]

## 2021-12-06 ENCOUNTER — Other Ambulatory Visit: Payer: Self-pay | Admitting: Family Medicine

## 2021-12-06 ENCOUNTER — Other Ambulatory Visit: Payer: Self-pay

## 2021-12-06 DIAGNOSIS — E1169 Type 2 diabetes mellitus with other specified complication: Secondary | ICD-10-CM

## 2021-12-06 MED ORDER — GLIPIZIDE 10 MG PO TABS
10.0000 mg | ORAL_TABLET | Freq: Two times a day (BID) | ORAL | 2 refills | Status: DC
  Filled 2021-12-06: qty 60, 30d supply, fill #0
  Filled 2022-01-04: qty 60, 30d supply, fill #1
  Filled 2022-01-30: qty 60, 30d supply, fill #2

## 2021-12-10 ENCOUNTER — Other Ambulatory Visit: Payer: Self-pay

## 2021-12-11 ENCOUNTER — Other Ambulatory Visit: Payer: Self-pay

## 2022-01-04 ENCOUNTER — Other Ambulatory Visit: Payer: Self-pay

## 2022-01-30 ENCOUNTER — Other Ambulatory Visit: Payer: Self-pay | Admitting: Family Medicine

## 2022-01-30 ENCOUNTER — Other Ambulatory Visit: Payer: Self-pay

## 2022-01-30 MED ORDER — PIOGLITAZONE HCL 15 MG PO TABS
15.0000 mg | ORAL_TABLET | Freq: Every day | ORAL | 3 refills | Status: DC
Start: 2022-01-30 — End: 2022-02-28
  Filled 2022-01-30: qty 30, 30d supply, fill #0

## 2022-01-30 NOTE — Progress Notes (Signed)
Informed by pharmacy that Januvia is not available at the moment due to hold-up with patient assistance application.  I have added Actos to her regimen as she was previously unable to tolerate metformin and Wilder Glade and has been opposed to an injectable.

## 2022-01-31 ENCOUNTER — Other Ambulatory Visit: Payer: Self-pay

## 2022-02-05 ENCOUNTER — Other Ambulatory Visit: Payer: Self-pay

## 2022-02-08 ENCOUNTER — Other Ambulatory Visit: Payer: Self-pay

## 2022-02-28 ENCOUNTER — Ambulatory Visit: Payer: Self-pay | Attending: Family Medicine | Admitting: Family Medicine

## 2022-02-28 ENCOUNTER — Other Ambulatory Visit: Payer: Self-pay

## 2022-02-28 ENCOUNTER — Encounter: Payer: Self-pay | Admitting: Family Medicine

## 2022-02-28 ENCOUNTER — Other Ambulatory Visit: Payer: Self-pay | Admitting: Family Medicine

## 2022-02-28 VITALS — BP 117/81 | HR 92 | Ht 65.0 in | Wt 206.6 lb

## 2022-02-28 DIAGNOSIS — Z6835 Body mass index (BMI) 35.0-35.9, adult: Secondary | ICD-10-CM

## 2022-02-28 DIAGNOSIS — E785 Hyperlipidemia, unspecified: Secondary | ICD-10-CM

## 2022-02-28 DIAGNOSIS — I152 Hypertension secondary to endocrine disorders: Secondary | ICD-10-CM

## 2022-02-28 DIAGNOSIS — Z23 Encounter for immunization: Secondary | ICD-10-CM

## 2022-02-28 DIAGNOSIS — B3731 Acute candidiasis of vulva and vagina: Secondary | ICD-10-CM

## 2022-02-28 DIAGNOSIS — E1169 Type 2 diabetes mellitus with other specified complication: Secondary | ICD-10-CM

## 2022-02-28 DIAGNOSIS — R300 Dysuria: Secondary | ICD-10-CM

## 2022-02-28 DIAGNOSIS — E1159 Type 2 diabetes mellitus with other circulatory complications: Secondary | ICD-10-CM

## 2022-02-28 LAB — POCT URINALYSIS DIP (CLINITEK)
Blood, UA: NEGATIVE
Glucose, UA: 500 mg/dL — AB
Leukocytes, UA: NEGATIVE
Nitrite, UA: NEGATIVE
POC PROTEIN,UA: 30 — AB
Spec Grav, UA: >=1.03 — AB (ref 1.010–1.025)
Urobilinogen, UA: 0.2 E.U./dL
pH, UA: 5.5 (ref 5.0–8.0)

## 2022-02-28 LAB — POCT GLYCOSYLATED HEMOGLOBIN (HGB A1C): HbA1c, POC (controlled diabetic range): 13.6 % — AB (ref 0.0–7.0)

## 2022-02-28 LAB — GLUCOSE, POCT (MANUAL RESULT ENTRY): POC Glucose: 355 mg/dl — AB (ref 70–99)

## 2022-02-28 MED ORDER — INSULIN ASPART 100 UNIT/ML IJ SOLN
5.0000 [IU] | Freq: Once | INTRAMUSCULAR | Status: AC
  Administered 2022-02-28: 5 [IU] via SUBCUTANEOUS

## 2022-02-28 MED ORDER — GLIPIZIDE 10 MG PO TABS
20.0000 mg | ORAL_TABLET | Freq: Two times a day (BID) | ORAL | 6 refills | Status: DC
  Filled 2022-02-28: qty 120, 30d supply, fill #0
  Filled 2022-04-03: qty 120, 30d supply, fill #1
  Filled 2022-06-25: qty 120, 30d supply, fill #2

## 2022-02-28 MED ORDER — OZEMPIC (0.25 OR 0.5 MG/DOSE) 2 MG/3ML ~~LOC~~ SOPN
0.2500 mg | PEN_INJECTOR | SUBCUTANEOUS | 0 refills | Status: DC
  Filled 2022-02-28: qty 3, 42d supply, fill #0

## 2022-02-28 MED ORDER — FLUCONAZOLE 150 MG PO TABS
150.0000 mg | ORAL_TABLET | Freq: Once | ORAL | 0 refills | Status: AC
  Filled 2022-02-28: qty 1, 1d supply, fill #0

## 2022-02-28 MED ORDER — OZEMPIC (0.25 OR 0.5 MG/DOSE) 2 MG/3ML ~~LOC~~ SOPN
0.5000 mg | PEN_INJECTOR | SUBCUTANEOUS | 6 refills | Status: DC
  Filled 2022-02-28: qty 2, 35d supply, fill #0
  Filled 2022-04-03: qty 3, 28d supply, fill #0

## 2022-02-28 MED ORDER — COMIRNATY 30 MCG/0.3ML IM SUSY
0.3000 mL | PREFILLED_SYRINGE | INTRAMUSCULAR | 0 refills | Status: DC
  Filled 2022-02-28: qty 0.3, 1d supply, fill #0

## 2022-02-28 NOTE — Patient Instructions (Signed)
Semaglutide Injection Qu es este medicamento? La SEMAGLUTIDA trata la diabetes tipo 2. Acta [employer] niveles de insulina en el cuerpo, lo cual disminuye el azcar en la sangre (glucosa). Tambin reduce la cantidad de azcar liberada en la sangre y desacelera la digestin. Tambin se puede usar para disminuir el riesgo de ataque cardaco y accidente cerebrovascular en personas con diabetes tipo 2. Con frecuencia, este medicamento se combina con cambios en la dieta y el ejercicio. Este medicamento puede ser utilizado para otros usos; si tiene alguna pregunta consulte con su proveedor de atencin mdica o con su farmacutico. MARCAS COMUNES: OZEMPIC Qu le debo informar a mi profesional de la salud antes de tomar este medicamento? Necesitan saber si usted presenta alguno de los siguientes problemas o situaciones: Tumores endocrinos (neoplasia endcrina mltiple tipo II) o si alguien en su familiar tuvo esos tumores Enfermedad ocular, problemas de la visin Antecedentes de pancreatitis Enfermedad renal Problemas estomacales Cncer de tiroides o si alguien en su familia tuvo cncer de tiroides Nurse, mental health o inusual a la semaglutida, a otros medicamentos, alimentos, colorantes o conservantes Si est embarazada o buscando quedar embarazada Si est amamantando a un beb Cmo debo utilizar este medicamento? Este medicamento se debe inyectar debajo de la piel de la parte superior de la pierna (muslo), del rea del estmago o de la parte superior del brazo. Se administra una vez por semana (cada 7 das). Le ensearn cmo preparar y Biomedical engineer. Use el medicamento exactamente como se le indique. Use su medicamento a intervalos regulares. No lo use con una frecuencia mayor a la indicada. Si [place] este medicamento con insulina, debe [employer] medicamento y la insulina por separado. No los mezcle. No se aplique una inyeccin al lado de la otra. Cambie (rote) los sitios de  inyeccin con cada inyeccin. Es importante que deseche las agujas y las jeringas usadas en un recipiente resistente a los pinchazos. No las deseche en la basura. Si no tiene un recipiente resistente a los pinchazos, llame a su farmacutico o a su equipo de atencin para obtenerlo. Su farmacutico le dar una Gua del medicamento especial (MedGuide, nombre en ingls) con cada receta y en cada ocasin que la vuelva a surtir. Asegrese de leer esta informacin cada vez cuidadosamente. Este medicamento viene con INSTRUCCIONES DE USO. Pdale a su farmacutico que le indique cmo usar Coca[employer]a. Lea la informacin atentamente. Hable con su farmacutico o su equipo de atencin si tiene [place] pregunta. Hable con su equipo de atencin sobre el uso de este medicamento en nios. Puede requerir atencin especial. Sobredosis: Pngase en contacto inmediatamente con un centro toxicolgico o una sala de urgencia si usted cree que haya tomado demasiado medicamento. ATENCIN: [employer] es solo para usted. No comparta este medicamento con nadie. Qu sucede si me olvido de una dosis? Si se olvida una dosis, tmela lo antes posible, si es dentro de los 5 das despus de la fecha en que deba tomarla. Luego tome la prxima dosis en el horario semanal habitual. Si han pasado ms de 5 das despus de [employer] dosis, no tome la dosis que se olvid. Administre la prxima dosis a la hora habitual. No se administre dosis adicionales o dobles. Si tiene preguntas sobre una dosis que se olvid, contacte a su equipo de atencin para que le brinde asesoramiento. Qu puede interactuar con este medicamento? Otros medicamentos para la diabetes Muchos medicamentos pueden causar Astronomer de azcar en la sangre. Estos  incluyen: Bebidas con alcohol Medicamentos antivirales para el VIH o SIDA Aspirina y otros medicamentos tipo aspirina Ciertos medicamentos para la presin arterial, enfermedad cardiaca y  frecuencia cardiaca irregular Cromo Diurticos Hormonas femeninas, tales como estrgenos o progestinas, pldoras anticonceptivas Fenofibrato Gemfibrozil Isoniazida Lanreotida Hormonas masculinas o esteroides anablicos IMAO, tales como Adena, Eldepryl, Marplan, Nardil y Parnate Medicamentos para bajar de peso Medicamentos para alergias, asma, resfriados o tos Medicamentos para depresin, ansiedad o trastornos psicticos Niacina Nicotina AINE, medicamentos para Conservation officer, historic buildings y la inflamacin, tales como ibuprofeno o naproxeno Octreotida Pasireotida Pentamidina Fenitona Probenecid Antibiticos del grupo de las quinolonas, tales como ciprofloxacino, levofloxacino y ofloxacino Algunos suplementos dietticos a base de Optometrist esteroideos, tales como la prednisona o la cortisona Sulfametoxazol; trimetoprima Hormonas tiroideas Algunos medicamentos pueden ocultar los sntomas de advertencia de niveles bajos de azcar en la sangre (hipoglucemia). Es posible que deba monitorear ms atentamente su nivel de azcar en la sangre si est tomando uno de estos medicamentos. Estos medicamentos incluyen: Betabloqueadores, que con frecuencia se usan para la presin arterial alta o problemas cardiacos (algunos ejemplos son atenolol, metoprolol y propranolol) Clonidina Guanetidina Reserpina Puede ser que esta lista no menciona todas las posibles interacciones. Informe a su profesional de [employer]	[place] de [employer] productos a base de hierbas, medicamentos de [place] o suplementos nutritivos que est tomando. Si usted fuma, consume bebidas alcohlicas o si utiliza drogas ilegales, indqueselo tambin a su profesional de [employer]	[place]. Algunas sustancias pueden interactuar con su medicamento. A qu debo estar atento al usar Coca-Cola? Visite a su equipo de atencin para que revise su evolucin peridicamente. Beba abundante cantidad de lquidos mientras [place] este medicamento. Consulte con su  equipo de atencin si tiene un ataque de diarrea grave, nuseas y vmitos. La prdida de demasiado lquido corporal puede hacer que sea peligroso usar Coca[employer]Cola. Se monitorizar una prueba llamada Hemoglobina A1C (A1C). Es un anlisis de sangre sencillo. Mide su control del nivel de azcar en la sangre durante los ltimos 2 a 3 meses. Se le realizar esta prueba cada 3 a 6 meses. Aprenda a revisar su nivel de azcar en la sangre. Conozca los sntomas del nivel bajo y alto de Location manager en la sangre, y cmo controlarlos. Siempre lleve con usted una fuente rpida de azcar por si tiene sntomas de nivel bajo de azcar [employer]. Algunos ejemplos incluyen caramelos duros de azcar o tabletas de glucosa. Asegrese de que otras personas sepan que usted se puede ahogar si come o bebe cuando presenta sntomas graves de [place] bajo de azcar en la sangre, como convulsiones o prdida del conocimiento. Deben obtener ayuda mdica de inmediato. Informe a su equipo de atencin si tiene [employer] de Dispensing optician. Es posible que deba cambiar la dosis de su medicamento. Si est enfermo o hace ms ejercicio que lo habitual, es posible que necesite cambiar la dosis de su medicamento. No saltee comidas. Pregunte a su equipo de atencin si debe evitar el alcohol. Muchos productos de venta libre para la tos y el resfriado contienen azcar o alcohol. Estos pueden afectar los niveles de Dispensing optician. Los inyectores nunca deben compartirse. Incluso si se cambia la aguja, al compartir se pueden contagiar virus como la hepatitis o el VIH. Use un brazalete o una cadena de identificacin mdica, y lleve con usted una tarjeta que describa su enfermedad, detalles de su medicamento y horario de dosis. No debe quedar embarazada mientras est tomando Coca[employer]a. Las  mujeres deben informar a su equipo de atencin si estn buscando quedar embarazadas o si creen que podran estar embarazadas. Existe la posibilidad  de efectos secundarios graves en un beb sin nacer. Para obtener ms informacin, hable con su equipo de atencin. Qu efectos secundarios puedo tener al [employer] este medicamento? Efectos secundarios que debe informar a su equipo de atencin tan pronto como sea posible: Reacciones alrgicas: erupcin cutnea, comezn/picazn, urticaria, hinchazn de la cara, los labios, la lengua o la garganta Cambio en la visin Deshidratacin: aumento de la sed, boca seca, sensacin de desmayo o aturdimiento, dolor de [place], Physicist, medical oscura o marrn Problemas en la vescula biliar: dolor de estmago intenso, nuseas, vmitos, fiebre Palpitaciones cardacas: frecuencia cardiaca rpida, intensa o irregular Lesin en los riones: disminucin en la cantidad de orina, hinchazn de los tobillos, las manos o los pies Pancreatitis: dolor abdominal intenso que se extiende a la espalda o empeora despus de comer o al tacto, fiebre, nuseas, vmitos Cncer de tiroides: masa o bulto nuevo en el cuello, dolor o problemas para tragar, problemas respiratorios, ronquera Efectos secundarios que generalmente no requieren atencin mdica (debe informarlos a su equipo de atencin si persisten o si son molestos): Diarrea Prdida del apetito Psychologist, counselling Vmito Puede ser que esta lista no menciona todos los posibles efectos secundarios. Comunquese a su mdico por asesoramiento mdico [employer] Usted puede informar los efectos secundarios a la FDA por telfono al 1-800-FDA-1088. Dnde debo guardar mi medicina? Mantenga fuera del alcance de los nios. Guarde los inyectores sin abrir en el refrigerador a una temperatura de [place] 2 y 15 grados Celsius (entre 82 y 42 grados Fahrenheit). No congele. [place] y del Freight forwarder. Despus de Therapist, music por primera vez, se puede almacenar por [address] a temperatura ambiente entre 15 y 78 grados Celsius (102 y 41 grados Fahrenheit) o Medical laboratory scientific officer. Deseche el inyector usado [address] despus del primer uso o despus de la fecha de vencimiento, lo que suceda primero. No almacene el inyector con la aguja puesta. Si se deja la aguja puesta, es posible que se escape medicamento del Cabin crew. ATENCIN: Este folleto es un resumen. Puede ser que no cubra toda la posible informacin. Si usted tiene preguntas acerca de esta medicina, consulte con su mdico, su farmacutico o su profesional de Technical sales engineer.  2023 Elsevier/Gold Standard (2020-10-09 00:00:00)

## 2022-02-28 NOTE — Progress Notes (Addendum)
Subjective:  Patient ID: Carmen Burns, female    DOB: 03/05/1961  Age: 61 y.o. MRN: [id number]  CC: Diabetes   HPI Carmen Burns is a 61 y.o. year old female with a history of type 2 diabetes mellitus (A1c 13.6), diabetic neuropathy hypertension, hyperlipidemia, GERD.   Interval History:  She Complains of headaches which started after initiation of Actos. She was previously on Januvia which is no longer available through the Medication assistance process. In the past she also had intolerance to Metformin and [place]. She has since discontinued Actos but has remained on glipizide. She declined injectables in the past. A1c is 13.6 up from 7.7. She reports not feeling well but is vague about her symptoms.  She has also had dysuria, vaginal itching. Endorses adherence with her antihypertensive and her statin. Her neuropathy is controlled on gabapentin. Past Medical History:  Diagnosis Date   Allergy    seasonal   Anemia 03/26/2011   Diabetes mellitus without complication (HCC)    GERD (gastroesophageal reflux disease)    Hyperlipidemia    Hypertension    Neuromuscular disorder (HCC)    neuropathy   PONV (postoperative nausea and vomiting)    Refusal of blood transfusions as patient is Jehovah's Witness     Past Surgical History:  Procedure Laterality Date   APPENDECTOMY     COLONOSCOPY     DILITATION & CURRETTAGE/HYSTROSCOPY WITH NOVASURE ABLATION N/A 01/12/2013   Procedure: DILATATION & CURETTAGE/HYSTEROSCOPY WITH NOVASURE ABLATION;  Surgeon: Osborne Oman, MD;  Location: [facility];  Service: Gynecology;  Laterality: N/A;   POLYPECTOMY      Family History  Problem Relation Age of Onset   Diabetes Father    Stroke Sister    Diabetes Brother    Hypertension Brother    Diabetes Brother    Diabetes Brother    Diabetes Brother    Diabetes Brother    Colon cancer Neg Hx    Breast cancer Neg Hx    Rectal cancer Neg Hx    Stomach cancer Neg Hx     Esophageal cancer Neg Hx    Colon polyps Neg Hx     Social History   Socioeconomic History   Marital status: Married    Spouse name: Not on file   Number of children: 3   Years of education: Not on file   Highest education level: 9th grade  Occupational History   Not on file  Tobacco Use   Smoking status: Never   Smokeless tobacco: Never  Vaping Use   Vaping Use: Never used  Substance and Sexual Activity   Alcohol use: Yes    Alcohol/week: 1.0 standard drink of alcohol    Types: 1 Glasses of wine per week    Comment: socially   Drug use: No   Sexual activity: Yes    Birth control/protection: Surgical    Comment: husband vasectomy  Other Topics Concern   Not on file  Social History Narrative   Not on file   Social Determinants of Health   Financial Resource Strain: Not on file  Food Insecurity: Food Insecurity Present (05/22/2021)   Hunger Vital Sign    Worried About Running Out of Food in the Last Year: Sometimes true    Ran Out of Food in the Last Year: Sometimes true  Transportation Needs: No Transportation Needs (05/22/2021)   PRAPARE - Hydrologist (Medical): No    Lack of Transportation (Non-Medical): No  Physical Activity: Not on file  Stress: Not on file  Social Connections: Not on file    Allergies  Allergen Reactions   Penicillins Itching and Rash    Outpatient Medications Prior to Visit  Medication Sig Dispense Refill   atorvastatin (LIPITOR) 40 MG tablet TAKE 1 TABLET (40 MG TOTAL) BY MOUTH DAILY. 90 tablet 1   Blood Glucose Monitoring Suppl (TRUE METRIX METER) w/Device KIT USE TO CHECK BLOOD SUGAR THREE TIMES DAILY AND AT BEDTIME 1 kit 0   cetirizine (ZYRTEC) 10 MG tablet Take 1 tablet (10 mg total) by mouth daily. 30 tablet 3   gabapentin (NEURONTIN) 300 MG capsule take 2 capsules by mouth every morning and 3 capsules my mouth in the evening 150 capsule 1   glucose blood (TRUE METRIX BLOOD GLUCOSE TEST) test strip  USE TO CHECK BLOOD SUGAR THREE TIMES DAILY AND AT BEDTIME 100 each 12   lisinopril-hydrochlorothiazide (ZESTORETIC) 10-12.5 MG tablet TAKE 1 TABLET BY MOUTH DAILY. 30 tablet 6   olopatadine (PATANOL) 0.1 % ophthalmic solution Place 1 drop into both eyes 2 (two) times daily. 5 mL 1   pantoprazole (PROTONIX) 40 MG tablet TAKE 1 TABLET (40 MG TOTAL) BY MOUTH DAILY. 30 tablet 6   PEG-KCl-NaCl-NaSulf-Na Asc-C (PLENVU) 140 g SOLR Take 1 kit by mouth as directed. 1 each 0   Psyllium (METAMUCIL) 28.3 % POWD metamucil s/f s/text orange 30 dose 6.1 oz  DISSOLVE ONE TEASPOONFUL IN EACH NOSTRIL EIGHT OUNCE OF WATER AND BEBER 1-3 VECES AL DIA     TRUEPLUS LANCETS 28G MISC USE TO CHECK BLOOD SUGAR THREE TIMES DAILY AND AT BEDTIME 100 each 2   glipiZIDE (GLUCOTROL) 10 MG tablet Take 1 tablet (10 mg total) by mouth 2 (two) times daily before a meal. 60 tablet 2   pioglitazone (ACTOS) 15 MG tablet Take 1 tablet (15 mg total) by mouth daily. 30 tablet 3   sitaGLIPtin (JANUVIA) 100 MG tablet Take 1 tablet (100 mg total) by mouth daily. 30 tablet 3   Facility-Administered Medications Prior to Visit  Medication Dose Route Frequency Provider Last Rate Last Admin   0.9 %  sodium chloride infusion  500 mL Intravenous Continuous Armbruster, Carlota Raspberry, MD         ROS Review of Systems  Constitutional:  Negative for activity change and appetite change.  HENT:  Negative for sinus pressure and sore throat.   Respiratory:  Negative for chest tightness, shortness of breath and wheezing.   Cardiovascular:  Negative for chest pain and palpitations.  Gastrointestinal:  Negative for abdominal distention, abdominal pain and constipation.  Genitourinary:  Positive for dysuria.  Musculoskeletal: Negative.   Neurological:  Positive for headaches.  Psychiatric/Behavioral:  Negative for behavioral problems and dysphoric mood.     Objective:  BP 117/81   Pulse 92   Ht _0  (1.651 m)   Wt 206 lb 9.6 oz (93.7 kg)   LMP  06/20/2015 Comment: no chance pregnant,husband has had vasectomy pt sates  SpO2 99%   BMI 34.38 kg/m      02/28/2022   10:26 AM 11/07/2021   12:21 PM 11/07/2021   12:20 PM  BP/Weight  Systolic BP 542 706 237  Diastolic BP 81 81 80  Wt. (Lbs) 206.6    BMI 34.38 kg/m2        Physical Exam Constitutional:      Appearance: She is well-developed.  Cardiovascular:     Rate and Rhythm: Normal rate.  Heart sounds: Normal heart sounds. No murmur heard. Pulmonary:     Effort: Pulmonary effort is normal.     Breath sounds: Normal breath sounds. No wheezing or rales.  Chest:     Chest wall: No tenderness.  Abdominal:     General: Bowel sounds are normal. There is no distension.     Palpations: Abdomen is soft. There is no mass.     Tenderness: There is no abdominal tenderness.  Musculoskeletal:        General: Normal range of motion.     Right lower leg: No edema.     Left lower leg: No edema.  Neurological:     Mental Status: She is alert and oriented to person, place, and time.  Psychiatric:        Mood and Affect: Mood normal.        Latest Ref Rng & Units 04/19/2021    3:28 PM 12/21/2020   12:00 PM 05/16/2020   11:51 AM  CMP  Glucose 70 - 99 mg/dL 173  123  125   BUN 8 - 27 mg/dL _0 Creatinine 0.57 - 1.00 mg/dL 0.76  0.92  0.80   Sodium 134 - 144 mmol/L 137  141  138   Potassium 3.5 - 5.2 mmol/L 4.1  4.7  5.0   Chloride 96 - 106 mmol/L 101  103  100   CO2 20 - 29 mmol/L  23  22   Calcium 8.7 - 10.3 mg/dL 10.0  9.5  9.8   Total Protein 6.0 - 8.5 g/dL 7.3  7.8  7.7   Total Bilirubin 0.0 - 1.2 mg/dL 0.5  0.5  0.5   Alkaline Phos 44 - 121 IU/L 139  110  113   AST 0 - 40 IU/L _1 ALT 0 - 32 IU/L  18  18     Lipid Panel     Component Value Date/Time   CHOL 164 05/31/2021 1139   TRIG 99 05/31/2021 1139   HDL 65 05/31/2021 1139   CHOLHDL 3.0 12/21/2020 1200   CHOLHDL 3.2 01/19/2016 1043   VLDL 31 (H) 01/19/2016 1043   LDLCALC 81 05/31/2021  1139    CBC    Component Value Date/Time   WBC 6.9 04/19/2021 1528   WBC 8.4 03/22/2015 0918   RBC 4.90 04/19/2021 1528   RBC 4.61 03/22/2015 0918   HGB 14.5 04/19/2021 1528   HCT 43.4 04/19/2021 1528   PLT 217 04/19/2021 1528   MCV 89 04/19/2021 1528   MCH 29.6 04/19/2021 1528   MCH 29.9 03/22/2015 0918   MCHC 33.4 04/19/2021 1528   MCHC 32.9 03/22/2015 0918   RDW 12.2 04/19/2021 1528   LYMPHSABS 2.6 04/19/2021 1528   MONOABS 0.6 03/22/2015 0918   EOSABS 0.1 04/19/2021 1528   BASOSABS 0.1 04/19/2021 1528    Lab Results  Component Value Date   HGBA1C 13.6 (A) 02/28/2022    Assessment & Plan:  1. Type 2 diabetes mellitus with other specified complication, without long-term current use of insulin (HCC) Uncontrolled with A1c of 13.6 Goal is less than 7.0 CBG of 355, UA positive for glucose and ketones NovoLog 5 units administered She is not open to initiation of insulin but is open to initiation of GLP-1 RA Increased dose of glipizide from 10 mg twice daily to 20 mg twice daily Will initiate Ozempic and titrate up to maximum dose tolerated Clinical pharmacist  called in to provide education on administration Counseled on Diabetic diet, my plate method, 628 minutes of moderate intensity exercise/week Blood sugar logs with fasting goals of 80-120 mg/dl, random of less than 180 and in the event of sugars less than 60 mg/dl or greater than 400 mg/dl encouraged to notify the clinic. Advised on the need for annual eye exams, annual foot exams, Pneumonia vaccine. Will see her back in 1 month to review her blood sugar logs - POCT glucose (manual entry) - POCT glycosylated hemoglobin (Hb A1C) - Semaglutide,0.25 or 0.5MG/DOS, (OZEMPIC, 0.25 OR 0.5 MG/DOSE,) 2 MG/3ML SOPN; Inject 0.25 mg into the skin once a week. For 4 weeks then increase to 0.1m once a week.  Dispense: 3 mL; Refill: 0 - CMP14+EGFR - glipiZIDE (GLUCOTROL) 10 MG tablet; Take 2 tablets (20 mg total) by mouth 2 (two)  times daily before a meal.  Dispense: 120 tablet; Refill: 6 - Semaglutide,0.25 or 0.5MG/DOS, (OZEMPIC, 0.25 OR 0.5 MG/DOSE,) 2 MG/3ML SOPN; Inject 0.5 mg into the skin once a week.  Dispense: 3 mL; Refill: 6 - LP+Non-HDL Cholesterol - insulin aspart (novoLOG) injection 5 Units  2. Dysuria UA negative for UTI She is likely experiencing symptoms of vaginal candidiasis - POCT URINALYSIS DIP (CLINITEK)  3. Class 2 severe obesity due to excess calories with serious comorbidity and body mass index (BMI) of 35.0 to 35.9 in adult ([facility] Counseled on caloric restriction, exercise GLP-1 RA initiation will be beneficial  4. Hypertension associated with diabetes ([place] Controlled Continue current regimen Counseled on blood pressure goal of less than 130/80, low-sodium, DASH diet, medication compliance, 150 minutes of moderate intensity exercise per week. Discussed medication compliance, adverse effects.    5. Hyperlipidemia associated with type 2 diabetes mellitus ([place] Controlled Continue Lipitor Low cholesterol diet  6. Need for immunization against influenza - Flu Vaccine QUAD 674moM (Fluarix, Fluzone & Alfiuria Quad PF)  7. Vaginal candidiasis Likely due to hyperglycemia - fluconazole (DIFLUCAN) 150 MG tablet; Take 1 tablet (150 mg total) by mouth once for 1 dose.  Dispense: 1 tablet; Refill: 0    Meds ordered this encounter  Medications   Semaglutide,0.25 or 0.5MG/DOS, (OZEMPIC, 0.25 OR 0.5 MG/DOSE,) 2 MG/3ML SOPN    Sig: Inject 0.25 mg into the skin once a week. For 4 weeks then increase to 0.57m77mnce a week.    Dispense:  3 mL    Refill:  0   fluconazole (DIFLUCAN) 150 MG tablet    Sig: Take 1 tablet (150 mg total) by mouth once for 1 dose.    Dispense:  1 tablet    Refill:  0   glipiZIDE (GLUCOTROL) 10 MG tablet    Sig: Take 2 tablets (20 mg total) by mouth 2 (two) times daily before a meal.    Dispense:  120 tablet    Refill:  6    Dose increase   Semaglutide,0.25 or  0.5MG/DOS, (OZEMPIC, 0.25 OR 0.5 MG/DOSE,) 2 MG/3ML SOPN    Sig: Inject 0.5 mg into the skin once a week.    Dispense:  3 mL    Refill:  6   insulin aspart (novoLOG) injection 5 Units    Follow-up: Return in about 1 month (around 03/31/2022) for Diabetes follow-up.     Visit required 49 minutes of patient care including median intraservice time, reviewing previous notes and test results, coordination of care, counseling the patient in addition to management of chronic medical conditions.Time also spent ordering medications, investigations and documenting in  the chart.  All questions were answered to the patient's satisfaction   Charlott Rakes, MD, FAAFP. [facility] and [place], [place]   02/28/2022, 12:30 PM

## 2022-02-28 NOTE — Progress Notes (Signed)
ACTOS medication does not make her feel well.

## 2022-03-04 ENCOUNTER — Other Ambulatory Visit: Payer: Self-pay

## 2022-03-11 ENCOUNTER — Other Ambulatory Visit: Payer: Self-pay

## 2022-03-12 ENCOUNTER — Other Ambulatory Visit: Payer: Self-pay

## 2022-03-15 ENCOUNTER — Other Ambulatory Visit: Payer: Self-pay

## 2022-03-22 ENCOUNTER — Other Ambulatory Visit: Payer: Self-pay

## 2022-04-03 ENCOUNTER — Ambulatory Visit: Payer: Self-pay | Attending: Family Medicine | Admitting: Family Medicine

## 2022-04-03 ENCOUNTER — Other Ambulatory Visit: Payer: Self-pay | Admitting: Family Medicine

## 2022-04-03 ENCOUNTER — Other Ambulatory Visit: Payer: Self-pay

## 2022-04-03 ENCOUNTER — Encounter: Payer: Self-pay | Admitting: Family Medicine

## 2022-04-03 VITALS — BP 132/79 | HR 98 | Temp 98.2°F | Ht 65.0 in | Wt 204.8 lb

## 2022-04-03 DIAGNOSIS — G8929 Other chronic pain: Secondary | ICD-10-CM

## 2022-04-03 DIAGNOSIS — E1169 Type 2 diabetes mellitus with other specified complication: Secondary | ICD-10-CM

## 2022-04-03 DIAGNOSIS — M25511 Pain in right shoulder: Secondary | ICD-10-CM

## 2022-04-03 LAB — GLUCOSE, POCT (MANUAL RESULT ENTRY): POC Glucose: 245 mg/dl — AB (ref 70–99)

## 2022-04-03 MED ORDER — MELOXICAM 7.5 MG PO TABS
7.5000 mg | ORAL_TABLET | Freq: Every day | ORAL | 1 refills | Status: AC
  Filled 2022-04-03: qty 30, 30d supply, fill #0

## 2022-04-03 MED ORDER — SEMAGLUTIDE (1 MG/DOSE) 4 MG/3ML ~~LOC~~ SOPN
1.0000 mg | PEN_INJECTOR | SUBCUTANEOUS | 6 refills | Status: DC
  Filled 2022-04-03 – 2022-05-10 (×2): qty 3, 28d supply, fill #0

## 2022-04-03 MED ORDER — TRUE METRIX BLOOD GLUCOSE TEST VI STRP
ORAL_STRIP | 11 refills | Status: DC
  Filled 2022-04-03: qty 100, 25d supply, fill #0
  Filled 2022-08-28: qty 100, 25d supply, fill #1

## 2022-04-03 NOTE — Progress Notes (Signed)
Pain in right shoulder.

## 2022-04-03 NOTE — Telephone Encounter (Signed)
Requested medication (s) are due for refill today: expired medication  Requested medication (s) are on the active medication list: yes   Last refill:   09/06/20 #100 12 refills  Future visit scheduled: yes in 1 month, today   Notes to clinic:  expired medication. Do you want to renew Rx?     Requested Prescriptions  Pending Prescriptions Disp Refills   glucose blood (TRUE METRIX BLOOD GLUCOSE TEST) test strip 100 each 12    Sig: USE TO CHECK BLOOD SUGAR THREE TIMES DAILY AND AT BEDTIME     Endocrinology: Diabetes - Testing Supplies Passed - 04/03/2022 11:15 AM      Passed - Valid encounter within last 12 months    Recent Outpatient Visits           Today Type 2 diabetes mellitus with other specified complication, without long-term current use of insulin ([place])   [place], Kramer, MD   1 month ago Type 2 diabetes mellitus with other specified complication, without long-term current use of insulin ([place])   [place], [place], MD   7 months ago Type 2 diabetes mellitus with other specified complication, without long-term current use of insulin ([place])   [place], [place], MD   10 months ago Type 2 diabetes mellitus with other specified complication, without long-term current use of insulin ([place])   [place], [place], MD   1 year ago Type 2 diabetes mellitus with other specified complication, without long-term current use of insulin ([place])   [place], MD       Future Appointments             In 1 month Charlott Rakes, MD [place]

## 2022-04-03 NOTE — Patient Instructions (Signed)
Shoulder Pain Many things can cause shoulder pain, including: An injury to the shoulder. Overuse of the shoulder. Arthritis. The source of the pain can be: Inflammation. An injury to the shoulder joint. An injury to a tendon, ligament, or bone. Follow these instructions at home: Pay attention to changes in your symptoms. Let your health care provider know about them. Follow these instructions to relieve your pain. If you have a sling: Wear the sling as told by your health care provider. Remove it only as told by your health care provider. Loosen the sling if your fingers tingle, become numb, or turn cold and blue. Keep the sling clean. If the sling is not waterproof: Do not let it get wet. Remove it to shower or bathe. Move your arm as little as possible, but keep your hand moving to prevent swelling. Managing pain, stiffness, and swelling  If directed, put ice on the painful area: Put ice in a plastic bag. Place a towel between your skin and the bag. Leave the ice on for 20 minutes, 2-3 times per day. Stop applying ice if it does not help with the pain. Squeeze a soft ball or a foam pad as much as possible. This helps to keep the shoulder from swelling. It also helps to strengthen the arm. General instructions Take over-the-counter and prescription medicines only as told by your health care provider. Keep all follow-up visits as told by your health care provider. This is important. Contact a health care provider if: Your pain gets worse. Your pain is not relieved with medicines. New pain develops in your arm, hand, or fingers. Get help right away if: Your arm, hand, or fingers: Tingle. Become numb. Become swollen. Become painful. Turn white or blue. Summary Shoulder pain can be caused by an injury, overuse, or arthritis. Pay attention to changes in your symptoms. Let your health care provider know about them. This condition may be treated with a sling, ice, and pain  medicines. Contact your health care provider if the pain gets worse or new pain develops. Get help right away if your arm, hand, or fingers tingle or become numb, swollen, or painful. Keep all follow-up visits as told by your health care provider. This is important. This information is not intended to replace advice given to you by your health care provider. Make sure you discuss any questions you have with your health care provider. Document Revised: 11/24/2020 Document Reviewed: 11/24/2020 Elsevier Patient Education  [place].

## 2022-04-03 NOTE — Progress Notes (Signed)
Subjective:  Patient ID: Carmen Burns, female    DOB: October 31, 1960  Age: 62 y.o. MRN: [id number]  CC: Diabetes   HPI Carmen Burns is a 62 y.o. year old female with a history of type 2 diabetes mellitus (A1c 13.6), diabetic neuropathy hypertension, hyperlipidemia, GERD.    Interval History:  Complains of right shoulder pain since she received the COVID vaccine last month. This was administered in her right biceps. Pain is rated at 6/10 and occurs even when she is asleep and sometimes wakes her up in her sleep. On further questioning she admits that pain started prior to 1 month ago.  She denies presence of weakness.  She is right-hand dominant.  She also has pain with overhead motions.  No preceding history of trauma.  She is here to follow up on her Diabetes as she was placed on Ozempic at her last visit.  She reports reduced appetite and does have mild nausea when she tries to overeat.  Glipizide dose was also increased from 10 mg twice daily to 20 mg twice daily but she has noticed ' an upset stomach with this' but is willing to continue taking glipizide as she was unable to tolerate SGLT2, metformin in the past due to GI adverse effects. Sugars are around 155 fasting.  Past Surgical History:  Procedure Laterality Date   APPENDECTOMY     COLONOSCOPY     DILITATION & CURRETTAGE/HYSTROSCOPY WITH NOVASURE ABLATION N/A 01/12/2013   Procedure: DILATATION & CURETTAGE/HYSTEROSCOPY WITH NOVASURE ABLATION;  Surgeon: Osborne Oman, MD;  Location: [facility];  Service: Gynecology;  Laterality: N/A;   POLYPECTOMY      Family History  Problem Relation Age of Onset   Diabetes Father    Stroke Sister    Diabetes Brother    Hypertension Brother    Diabetes Brother    Diabetes Brother    Diabetes Brother    Diabetes Brother    Colon cancer Neg Hx    Breast cancer Neg Hx    Rectal cancer Neg Hx    Stomach cancer Neg Hx    Esophageal cancer Neg Hx    Colon polyps Neg  Hx     Social History   Socioeconomic History   Marital status: Married    Spouse name: Not on file   Number of children: 3   Years of education: Not on file   Highest education level: 9th grade  Occupational History   Not on file  Tobacco Use   Smoking status: Never   Smokeless tobacco: Never  Vaping Use   Vaping Use: Never used  Substance and Sexual Activity   Alcohol use: Yes    Alcohol/week: 1.0 standard drink of alcohol    Types: 1 Glasses of wine per week    Comment: socially   Drug use: No   Sexual activity: Yes    Birth control/protection: Surgical    Comment: husband vasectomy  Other Topics Concern   Not on file  Social History Narrative   Not on file   Social Determinants of Health   Financial Resource Strain: Not on file  Food Insecurity: Food Insecurity Present (05/22/2021)   Hunger Vital Sign    Worried About Running Out of Food in the Last Year: Sometimes true    Ran Out of Food in the Last Year: Sometimes true  Transportation Needs: No Transportation Needs (05/22/2021)   PRAPARE - Hydrologist (Medical): No  Lack of Transportation (Non-Medical): No  Physical Activity: Not on file  Stress: Not on file  Social Connections: Not on file    Allergies  Allergen Reactions   Penicillins Itching and Rash    Outpatient Medications Prior to Visit  Medication Sig Dispense Refill   atorvastatin (LIPITOR) 40 MG tablet TAKE 1 TABLET (40 MG TOTAL) BY MOUTH DAILY. 90 tablet 1   Blood Glucose Monitoring Suppl (TRUE METRIX METER) w/Device KIT USE TO CHECK BLOOD SUGAR THREE TIMES DAILY AND AT BEDTIME 1 kit 0   cetirizine (ZYRTEC) 10 MG tablet Take 1 tablet (10 mg total) by mouth daily. 30 tablet 3   COVID-19 mRNA vaccine 2023-2024 (COMIRNATY) syringe Inject 0.3 mLs into the muscle. 0.3 mL 0   gabapentin (NEURONTIN) 300 MG capsule take 2 capsules by mouth every morning and 3 capsules my mouth in the evening 150 capsule 1   glipiZIDE  (GLUCOTROL) 10 MG tablet Take 2 tablets (20 mg total) by mouth 2 (two) times daily before a meal. 120 tablet 6   glucose blood (TRUE METRIX BLOOD GLUCOSE TEST) test strip USE TO CHECK BLOOD SUGAR THREE TIMES DAILY AND AT BEDTIME 100 each 12   lisinopril-hydrochlorothiazide (ZESTORETIC) 10-12.5 MG tablet TAKE 1 TABLET BY MOUTH DAILY. 30 tablet 6   olopatadine (PATANOL) 0.1 % ophthalmic solution Place 1 drop into both eyes 2 (two) times daily. 5 mL 1   PEG-KCl-NaCl-NaSulf-Na Asc-C (PLENVU) 140 g SOLR Take 1 kit by mouth as directed. 1 each 0   Psyllium (METAMUCIL) 28.3 % POWD metamucil s/f s/text orange 30 dose 6.1 oz  DISSOLVE ONE TEASPOONFUL IN EACH NOSTRIL EIGHT OUNCE OF WATER AND BEBER 1-3 VECES AL DIA     Semaglutide,0.25 or 0.'5MG'$ /DOS, (OZEMPIC, 0.25 OR 0.5 MG/DOSE,) 2 MG/3ML SOPN Inject 0.5 mg into the skin once a week. 3 mL 6   TRUEPLUS LANCETS 28G MISC USE TO CHECK BLOOD SUGAR THREE TIMES DAILY AND AT BEDTIME 100 each 2   Semaglutide,0.25 or 0.'5MG'$ /DOS, (OZEMPIC, 0.25 OR 0.5 MG/DOSE,) 2 MG/3ML SOPN Inject 0.25 mg into the skin once a week. For 4 weeks then increase to 0.'5mg'$  once a week. 3 mL 0   pantoprazole (PROTONIX) 40 MG tablet TAKE 1 TABLET (40 MG TOTAL) BY MOUTH DAILY. 30 tablet 6   Facility-Administered Medications Prior to Visit  Medication Dose Route Frequency Provider Last Rate Last Admin   0.9 %  sodium chloride infusion  500 mL Intravenous Continuous Armbruster, Carlota Raspberry, MD         ROS Review of Systems  Constitutional:  Negative for activity change and appetite change.  HENT:  Negative for sinus pressure and sore throat.   Respiratory:  Negative for chest tightness, shortness of breath and wheezing.   Cardiovascular:  Negative for chest pain and palpitations.  Gastrointestinal:  Negative for abdominal distention, abdominal pain and constipation.  Genitourinary: Negative.   Musculoskeletal: Negative.   Psychiatric/Behavioral:  Negative for behavioral problems and  dysphoric mood.    Objective:  BP 132/79   Pulse 98   Temp 98.2 F (36.8 C) (Oral)   Ht '5\' 5"'$  (1.651 m)   Wt 204 lb 12.8 oz (92.9 kg)   LMP 06/20/2015 Comment: no chance pregnant,husband has had vasectomy pt sates  SpO2 100%   BMI 34.08 kg/m      04/03/2022   10:33 AM 02/28/2022   10:26 AM 11/07/2021   12:21 PM  BP/Weight  Systolic BP 144 315 400  Diastolic BP 79 81  81  Wt. (Lbs) 204.8 206.6   BMI 34.08 kg/m2 34.38 kg/m2       Physical Exam Constitutional:      Appearance: She is well-developed.  Cardiovascular:     Rate and Rhythm: Normal rate.     Heart sounds: Normal heart sounds. No murmur heard. Pulmonary:     Effort: Pulmonary effort is normal.     Breath sounds: Normal breath sounds. No wheezing or rales.  Chest:     Chest wall: No tenderness.  Abdominal:     General: Bowel sounds are normal. There is no distension.     Palpations: Abdomen is soft. There is no mass.     Tenderness: There is no abdominal tenderness.  Musculoskeletal:     Right lower leg: No edema.     Left lower leg: No edema.     Comments: Normal appearance of both shoulder joints, normal appearance of both biceps muscles with no evidence of induration or erythema Forward flexion of right shoulder limited to 150 degrees but normal on the left Positive Neer and Hawkins signs on the right shoulder Normal handgrip bilaterally Tenderness to palpation of anterior shoulder joint  Neurological:     Mental Status: She is alert and oriented to person, place, and time.  Psychiatric:        Mood and Affect: Mood normal.        Latest Ref Rng & Units 04/19/2021    3:28 PM 12/21/2020   12:00 PM 05/16/2020   11:51 AM  CMP  Glucose 70 - 99 mg/dL 173  123  125   BUN 8 - 27 mg/dL '12  14  9   '$ Creatinine 0.57 - 1.00 mg/dL 0.76  0.92  0.80   Sodium 134 - 144 mmol/L 137  141  138   Potassium 3.5 - 5.2 mmol/L 4.1  4.7  5.0   Chloride 96 - 106 mmol/L 101  103  100   CO2 20 - 29 mmol/L  23  22    Calcium 8.7 - 10.3 mg/dL 10.0  9.5  9.8   Total Protein 6.0 - 8.5 g/dL 7.3  7.8  7.7   Total Bilirubin 0.0 - 1.2 mg/dL 0.5  0.5  0.5   Alkaline Phos 44 - 121 IU/L 139  110  113   AST 0 - 40 IU/L '21  21  23   '$ ALT 0 - 32 IU/L  18  18     Lipid Panel     Component Value Date/Time   CHOL 164 05/31/2021 1139   TRIG 99 05/31/2021 1139   HDL 65 05/31/2021 1139   CHOLHDL 3.0 12/21/2020 1200   CHOLHDL 3.2 01/19/2016 1043   VLDL 31 (H) 01/19/2016 1043   LDLCALC 81 05/31/2021 1139    CBC    Component Value Date/Time   WBC 6.9 04/19/2021 1528   WBC 8.4 03/22/2015 0918   RBC 4.90 04/19/2021 1528   RBC 4.61 03/22/2015 0918   HGB 14.5 04/19/2021 1528   HCT 43.4 04/19/2021 1528   PLT 217 04/19/2021 1528   MCV 89 04/19/2021 1528   MCH 29.6 04/19/2021 1528   MCH 29.9 03/22/2015 0918   MCHC 33.4 04/19/2021 1528   MCHC 32.9 03/22/2015 0918   RDW 12.2 04/19/2021 1528   LYMPHSABS 2.6 04/19/2021 1528   MONOABS 0.6 03/22/2015 0918   EOSABS 0.1 04/19/2021 1528   BASOSABS 0.1 04/19/2021 1528    Lab Results  Component Value Date   HGBA1C  13.6 (A) 02/28/2022    Assessment & Plan:  1. Type 2 diabetes mellitus with other specified complication, without long-term current use of insulin (HCC) Uncontrolled with A1c of 13.6 Glipizide dose had been increased at her last visit and Ozempic initiated Ozempic dose increased today and she will commence 0.5 mg for 1 month and then increase to 1 mg for the next month until her visit with me in 2 months If A1c is at goal consider backing down on dose of glipizide due to GI complaints and increasing Ozempic to maximum tolerable dose In the past she has been unable to tolerate metformin and SGLT2i due to GI adverse effects and she is not open to insulin. Counseled on Diabetic diet, my plate method, 017 minutes of moderate intensity exercise/week Blood sugar logs with fasting goals of 80-120 mg/dl, random of less than 180 and in the event of sugars less  than 60 mg/dl or greater than 400 mg/dl encouraged to notify the clinic. Advised on the need for annual eye exams, annual foot exams, Pneumonia vaccine. - POCT glucose (manual entry) - Semaglutide, 1 MG/DOSE, 4 MG/3ML SOPN; Inject 1 mg as directed once a week. After completion of 0.'5mg'$  dose  Dispense: 3 mL; Refill: 6  2. Chronic right shoulder pain Suspicious for rotator cuff tendinopathy especially with nighttime pain and pain with overhead motions I do not see evidence of myopathy or myositis on my exam which could have resulted from her COVID-vaccine last month She has been advised to apply ice to her right biceps and has been reassured It is possible that her pain is multifactorial from shoulder joint and healed recent vaccination - meloxicam (MOBIC) 7.5 MG tablet; Take 1 tablet (7.5 mg total) by mouth daily.  Dispense: 30 tablet; Refill: 1   Meds ordered this encounter  Medications   Semaglutide, 1 MG/DOSE, 4 MG/3ML SOPN    Sig: Inject 1 mg as directed once a week. After completion of 0.'5mg'$  dose    Dispense:  3 mL    Refill:  6   meloxicam (MOBIC) 7.5 MG tablet    Sig: Take 1 tablet (7.5 mg total) by mouth daily.    Dispense:  30 tablet    Refill:  1    Follow-up: Return in about 2 months (around 06/02/2022) for Diabetes follow-up.       Charlott Rakes, MD, FAAFP. [facility] [place], [place]   04/03/2022, 11:14 AM

## 2022-04-04 ENCOUNTER — Other Ambulatory Visit: Payer: Self-pay

## 2022-04-09 ENCOUNTER — Other Ambulatory Visit: Payer: Self-pay

## 2022-05-10 ENCOUNTER — Other Ambulatory Visit: Payer: Self-pay

## 2022-05-14 ENCOUNTER — Telehealth: Payer: Self-pay

## 2022-05-14 NOTE — Telephone Encounter (Signed)
Returned patient's telephone call using language line interpreter#[id number]. Patient did not answer and voice mail unavailable. [facility] unable to leave a message at this time.

## 2022-05-27 ENCOUNTER — Other Ambulatory Visit: Payer: Self-pay

## 2022-05-29 ENCOUNTER — Other Ambulatory Visit: Payer: Self-pay

## 2022-05-29 DIAGNOSIS — Z1231 Encounter for screening mammogram for malignant neoplasm of breast: Secondary | ICD-10-CM

## 2022-05-30 ENCOUNTER — Encounter: Payer: Self-pay | Admitting: Family Medicine

## 2022-05-30 ENCOUNTER — Other Ambulatory Visit: Payer: Self-pay

## 2022-05-30 ENCOUNTER — Ambulatory Visit: Payer: Self-pay | Attending: Family Medicine | Admitting: Family Medicine

## 2022-05-30 VITALS — BP 116/79 | HR 105 | Ht 65.0 in | Wt 201.4 lb

## 2022-05-30 DIAGNOSIS — J0100 Acute maxillary sinusitis, unspecified: Secondary | ICD-10-CM

## 2022-05-30 DIAGNOSIS — I152 Hypertension secondary to endocrine disorders: Secondary | ICD-10-CM

## 2022-05-30 DIAGNOSIS — E1169 Type 2 diabetes mellitus with other specified complication: Secondary | ICD-10-CM

## 2022-05-30 DIAGNOSIS — E1149 Type 2 diabetes mellitus with other diabetic neurological complication: Secondary | ICD-10-CM

## 2022-05-30 DIAGNOSIS — E1159 Type 2 diabetes mellitus with other circulatory complications: Secondary | ICD-10-CM

## 2022-05-30 LAB — POCT GLYCOSYLATED HEMOGLOBIN (HGB A1C): HbA1c, POC (controlled diabetic range): 8.3 % — AB (ref 0.0–7.0)

## 2022-05-30 LAB — GLUCOSE, POCT (MANUAL RESULT ENTRY): POC Glucose: 126 mg/dl — AB (ref 70–99)

## 2022-05-30 MED ORDER — LISINOPRIL-HYDROCHLOROTHIAZIDE 10-12.5 MG PO TABS
1.0000 | ORAL_TABLET | Freq: Every day | ORAL | 1 refills | Status: DC
  Filled 2022-05-30 – 2022-06-06 (×2): qty 90, 90d supply, fill #0
  Filled 2022-09-03: qty 90, 90d supply, fill #1

## 2022-05-30 MED ORDER — SEMAGLUTIDE (2 MG/DOSE) 8 MG/3ML ~~LOC~~ SOPN
2.0000 mg | PEN_INJECTOR | SUBCUTANEOUS | 6 refills | Status: DC
  Filled 2022-05-30: qty 3, 28d supply, fill #0
  Filled 2022-06-21: qty 12, 112d supply, fill #1
  Filled 2022-10-16: qty 6, 56d supply, fill #2

## 2022-05-30 MED ORDER — GABAPENTIN 300 MG PO CAPS
ORAL_CAPSULE | ORAL | 1 refills | Status: DC
  Filled 2022-05-30: qty 150, 30d supply, fill #0

## 2022-05-30 NOTE — Progress Notes (Signed)
Nasal congestion.  

## 2022-05-30 NOTE — Patient Instructions (Signed)
Dolor sinusal Sinus Pain  El dolor sinusal aparece cuando los senos paranasales se taponan o se inflaman. Los senos paranasales son espacios llenos de aire que se encuentran en el crneo detrs de los huesos de la cara y la frente. El dolor sinusal puede variar de leve a intenso. Cules son las causas? El dolor sinusal puede deberse a diferentes trastornos que afectan los senos paranasales. Las causas ms frecuentes incluyen las siguientes: Resfros. Infecciones de los senos paranasales. Alergias. Cules son los signos o sntomas? El principal sntoma de esta afeccin es un dolor o presin en la cara, la frente, los odos o las piezas dentales superiores. Las personas que tienen dolor sinusal suelen tener otros sntomas, por ejemplo: Secrecin o congestin nasal. Cristy Hilts. Imposibilidad para Cytogeneticist. Dolor de [place]. Los cambios climticos pueden intensificar los sntomas. Cmo se diagnostica? Para el diagnstico de esta afeccin, el mdico tendr en cuenta sus sntomas y un examen mdico. Si tiene dolor que se vuelve recurrente o no desaparece, el mdico puede recomendarle ms estudios. Esto puede incluir: Estudios de diagnstico por imgenes, como una exploracin por tomografa computarizada (TC) o una resonancia magntica (RM), para Product manager presencia de problemas en los senos paranasales. El examen de los senos paranasales mediante un instrumento fino con una cmara que se introduce por la nariz (endoscopa). Cmo se trata? El tratamiento de esta afeccin depende de la causa. El dolor sinusal causado por una infeccin de los senos paranasales se puede tratar con antibiticos. El dolor sinusal causado por congestin se puede Public house manager al Child psychotherapist (lavar) la Lawyer y los senos paranasales con una solucin salina. El dolor sinusal causado por alergias se puede aliviar con medicamentos antialrgicos (antihistamnicos) y Education officer, museum. En algunos casos, puede ser  necesaria la ciruga sinusal si otros tratamientos no son eficaces. Siga estas indicaciones en su casa: Indicaciones generales Si se lo indican: Aplique un pao tibio y [employer] cara para ayudar a Best boy. Use solucin salina nasal. Siga las instrucciones en el frasco o la caja. Hidrtese y humidifique los ambientes Beba suficiente agua para Theatre manager la orina clara o de color amarillo plido. Mantenerse hidratado lo ayudar a [employer]. Use un humidificador si su casa es seca. Realice inhalaciones de vapor por 10 a 15 minutos, de 3 a 4 veces al da o tal como se lo haya indicado el mdico. Puede hacer esto en el bao con el vapor del agua caliente de la ducha. Limite la exposicin al aire fro o seco. Medicamentos  Use los medicamentos de venta libre y los recetados solamente como se lo haya indicado el mdico. Si le recetaron un antibitico, tmelo como se lo haya indicado el mdico. No deje de tomar el antibitico aunque comience a sentirse mejor. Si est congestionado, utilice un aerosol nasal para ayudar a Clinical cytogeneticist presin. Comunquese con un mdico si: Tiene dolor sinusal ms de una vez por semana. Tiene sensibilidad a la luz o al ruido. Presenta fiebre. Siente nuseas o vomita. El dolor sinusal o el dolor de cabeza no se alivia con el [place]. Solicite ayuda de inmediato si: Tiene problemas de visin. Siente un dolor intenso repentino en el rostro o la cabeza. Tiene una convulsin. Se siente confundido. Tiene rigidez en el cuello. Resumen El dolor sinusal aparece cuando los senos paranasales se taponan o se inflaman. El dolor sinusal puede deberse a diferentes trastornos que [employer] senos paranasales, como un resfro, una infeccin sinusal o Obie Dredge. El [place]  de esta afeccin depende de la causa. Esto puede incluir medicamentos, como antibiticos o antihistamnicos. Esta informacin no tiene Marine scientist el consejo del mdico.  Asegrese de hacerle al mdico cualquier pregunta que tenga. Document Revised: 02/28/2021 Document Reviewed: 02/28/2021 Elsevier Patient Education  [place].

## 2022-05-30 NOTE — Progress Notes (Signed)
Subjective:  Patient ID: Carmen Burns, female    DOB: January 07, 1961  Age: 62 y.o. MRN: [id number]  CC: Diabetes   HPI Carmen Burns is a 62 y.o. year old female with a history of type 2 diabetes mellitus (A1c 8.3), diabetic neuropathy hypertension, hyperlipidemia, GERD.     Interval History:  She has had nasal congestion for the last week but it is improving. This is associated with headaches and sinus pressure. She has been using her antihistamines for her symptoms.  Denies presence of fever.  A1c is 8.3 down from 13.6 previously.  She is doing well on Ozempic and tolerating it well.  Denies presence of hypoglycemia, or visual concerns.  Neuropathy is controlled on gabapentin. Endorses adherence with her statin and antihypertensive. Reflux symptoms are controlled. Past Medical History:  Diagnosis Date   Allergy    seasonal   Anemia 03/26/2011   Diabetes mellitus without complication (HCC)    GERD (gastroesophageal reflux disease)    Hyperlipidemia    Hypertension    Neuromuscular disorder (HCC)    neuropathy   PONV (postoperative nausea and vomiting)    Refusal of blood transfusions as patient is Jehovah's Witness     Past Surgical History:  Procedure Laterality Date   APPENDECTOMY     COLONOSCOPY     DILITATION & CURRETTAGE/HYSTROSCOPY WITH NOVASURE ABLATION N/A 01/12/2013   Procedure: DILATATION & CURETTAGE/HYSTEROSCOPY WITH NOVASURE ABLATION;  Surgeon: Osborne Oman, MD;  Location: [facility];  Service: Gynecology;  Laterality: N/A;   POLYPECTOMY      Family History  Problem Relation Age of Onset   Diabetes Father    Stroke Sister    Diabetes Brother    Hypertension Brother    Diabetes Brother    Diabetes Brother    Diabetes Brother    Diabetes Brother    Colon cancer Neg Hx    Breast cancer Neg Hx    Rectal cancer Neg Hx    Stomach cancer Neg Hx    Esophageal cancer Neg Hx    Colon polyps Neg Hx     Social History    Socioeconomic History   Marital status: Married    Spouse name: Not on file   Number of children: 3   Years of education: Not on file   Highest education level: 9th grade  Occupational History   Not on file  Tobacco Use   Smoking status: Never   Smokeless tobacco: Never  Vaping Use   Vaping Use: Never used  Substance and Sexual Activity   Alcohol use: Yes    Alcohol/week: 1.0 standard drink of alcohol    Types: 1 Glasses of wine per week    Comment: socially   Drug use: No   Sexual activity: Yes    Birth control/protection: Surgical    Comment: husband vasectomy  Other Topics Concern   Not on file  Social History Narrative   Not on file   Social Determinants of Health   Financial Resource Strain: Not on file  Food Insecurity: Food Insecurity Present (05/22/2021)   Hunger Vital Sign    Worried About Running Out of Food in the Last Year: Sometimes true    Ran Out of Food in the Last Year: Sometimes true  Transportation Needs: No Transportation Needs (05/22/2021)   PRAPARE - Hydrologist (Medical): No    Lack of Transportation (Non-Medical): No  Physical Activity: Not on file  Stress: Not on file  Social Connections: Not on file    Allergies  Allergen Reactions   Penicillins Itching and Rash    Outpatient Medications Prior to Visit  Medication Sig Dispense Refill   atorvastatin (LIPITOR) 40 MG tablet TAKE 1 TABLET (40 MG TOTAL) BY MOUTH DAILY. 90 tablet 1   Blood Glucose Monitoring Suppl (TRUE METRIX METER) w/Device KIT USE TO CHECK BLOOD SUGAR THREE TIMES DAILY AND AT BEDTIME 1 kit 0   cetirizine (ZYRTEC) 10 MG tablet Take 1 tablet (10 mg total) by mouth daily. 30 tablet 3   glipiZIDE (GLUCOTROL) 10 MG tablet Take 2 tablets (20 mg total) by mouth 2 (two) times daily before a meal. 120 tablet 6   glucose blood (TRUE METRIX BLOOD GLUCOSE TEST) test strip USE TO CHECK BLOOD SUGAR THREE TIMES DAILY AND AT BEDTIME 100 each 11   meloxicam  (MOBIC) 7.5 MG tablet Take 1 tablet (7.5 mg total) by mouth daily. 30 tablet 1   olopatadine (PATANOL) 0.1 % ophthalmic solution Place 1 drop into both eyes 2 (two) times daily. 5 mL 1   Psyllium (METAMUCIL) 28.3 % POWD metamucil s/f s/text orange 30 dose 6.1 oz  DISSOLVE ONE TEASPOONFUL IN EACH NOSTRIL EIGHT OUNCE OF WATER AND BEBER 1-3 VECES AL DIA     TRUEPLUS LANCETS 28G MISC USE TO CHECK BLOOD SUGAR THREE TIMES DAILY AND AT BEDTIME 100 each 2   gabapentin (NEURONTIN) 300 MG capsule take 2 capsules by mouth every morning and 3 capsules my mouth in the evening 150 capsule 1   lisinopril-hydrochlorothiazide (ZESTORETIC) 10-12.5 MG tablet TAKE 1 TABLET BY MOUTH DAILY. 30 tablet 6   Semaglutide, 1 MG/DOSE, 4 MG/3ML SOPN Inject 1 mg as directed once a week. After completion of 0.'5mg'$  dose 3 mL 6   Semaglutide,0.25 or 0.'5MG'$ /DOS, (OZEMPIC, 0.25 OR 0.5 MG/DOSE,) 2 MG/3ML SOPN Inject 0.5 mg into the skin once a week. 3 mL 6   COVID-19 mRNA vaccine 2023-2024 (COMIRNATY) syringe Inject 0.3 mLs into the muscle. (Patient not taking: Reported on 05/30/2022) 0.3 mL 0   pantoprazole (PROTONIX) 40 MG tablet TAKE 1 TABLET (40 MG TOTAL) BY MOUTH DAILY. 30 tablet 6   PEG-KCl-NaCl-NaSulf-Na Asc-C (PLENVU) 140 g SOLR Take 1 kit by mouth as directed. (Patient not taking: Reported on 05/30/2022) 1 each 0   Facility-Administered Medications Prior to Visit  Medication Dose Route Frequency Provider Last Rate Last Admin   0.9 %  sodium chloride infusion  500 mL Intravenous Continuous Armbruster, Carlota Raspberry, MD         ROS Review of Systems  Constitutional:  Negative for activity change and appetite change.  HENT:  Positive for congestion and postnasal drip. Negative for sinus pressure and sore throat.   Respiratory:  Negative for chest tightness, shortness of breath and wheezing.   Cardiovascular:  Negative for chest pain and palpitations.  Gastrointestinal:  Negative for abdominal distention, abdominal pain and  constipation.  Genitourinary: Negative.   Musculoskeletal: Negative.   Neurological:  Positive for headaches.  Psychiatric/Behavioral:  Negative for behavioral problems and dysphoric mood.     Objective:  BP 116/79   Pulse (!) 105   Ht '5\' 5"'$  (1.651 m)   Wt 201 lb 6.4 oz (91.4 kg)   LMP 06/20/2015 Comment: no chance pregnant,husband has had vasectomy pt sates  SpO2 98%   BMI 33.51 kg/m      05/30/2022   10:57 AM 04/03/2022   10:33 AM 02/28/2022   10:26 AM  BP/Weight  Systolic  BP 99991111 [id number]  Diastolic BP 79 79 81  Wt. (Lbs) 201.4 204.8 206.6  BMI 33.51 kg/m2 34.08 kg/m2 34.38 kg/m2      Physical Exam HENT:     Head:     Comments: Left maxillary sinus tenderness    Right Ear: Tympanic membrane normal.     Left Ear: Tympanic membrane normal.     Mouth/Throat:     Mouth: Mucous membranes are moist.     Pharynx: Oropharynx is clear.  Cardiovascular:     Rate and Rhythm: Tachycardia present.        Latest Ref Rng & Units 04/19/2021    3:28 PM 12/21/2020   12:00 PM 05/16/2020   11:51 AM  CMP  Glucose 70 - 99 mg/dL 173  123  125   BUN 8 - 27 mg/dL '12  14  9   '$ Creatinine 0.57 - 1.00 mg/dL 0.76  0.92  0.80   Sodium 134 - 144 mmol/L 137  141  138   Potassium 3.5 - 5.2 mmol/L 4.1  4.7  5.0   Chloride 96 - 106 mmol/L 101  103  100   CO2 20 - 29 mmol/L  23  22   Calcium 8.7 - 10.3 mg/dL 10.0  9.5  9.8   Total Protein 6.0 - 8.5 g/dL 7.3  7.8  7.7   Total Bilirubin 0.0 - 1.2 mg/dL 0.5  0.5  0.5   Alkaline Phos 44 - 121 IU/L 139  110  113   AST 0 - 40 IU/L '21  21  23   '$ ALT 0 - 32 IU/L  18  18     Lipid Panel     Component Value Date/Time   CHOL 164 05/31/2021 1139   TRIG 99 05/31/2021 1139   HDL 65 05/31/2021 1139   CHOLHDL 3.0 12/21/2020 1200   CHOLHDL 3.2 01/19/2016 1043   VLDL 31 (H) 01/19/2016 1043   LDLCALC 81 05/31/2021 1139    CBC    Component Value Date/Time   WBC 6.9 04/19/2021 1528   WBC 8.4 03/22/2015 0918   RBC 4.90 04/19/2021 1528   RBC 4.61  03/22/2015 0918   HGB 14.5 04/19/2021 1528   HCT 43.4 04/19/2021 1528   PLT 217 04/19/2021 1528   MCV 89 04/19/2021 1528   MCH 29.6 04/19/2021 1528   MCH 29.9 03/22/2015 0918   MCHC 33.4 04/19/2021 1528   MCHC 32.9 03/22/2015 0918   RDW 12.2 04/19/2021 1528   LYMPHSABS 2.6 04/19/2021 1528   MONOABS 0.6 03/22/2015 0918   EOSABS 0.1 04/19/2021 1528   BASOSABS 0.1 04/19/2021 1528    Lab Results  Component Value Date   HGBA1C 8.3 (A) 05/30/2022    Assessment & Plan:  1. Type 2 diabetes mellitus with other specified complication, without long-term current use of insulin (HCC) Uncontrolled with A1c of 8.3 which is down from 13.6 previously, goal is less than 7.0 She has been commended on improvement Will titrate Ozempic from 1 mg to 2 mg, continue glipizide Counseled on Diabetic diet, my plate method, [id number] minutes of moderate intensity exercise/week Blood sugar logs with fasting goals of 80-120 mg/dl, random of less than 180 and in the event of sugars less than 60 mg/dl or greater than 400 mg/dl encouraged to notify the clinic. Advised on the need for annual eye exams, annual foot exams, Pneumonia vaccine. - POCT glucose (manual entry) - POCT glycosylated hemoglobin (Hb A1C) - Semaglutide, 2 MG/DOSE, 8 MG/3ML SOPN;  Inject 2 mg as directed once a week.  Dispense: 3 mL; Refill: 6 - Microalbumin / creatinine urine ratio - CMP14+EGFR  2. Hypertension associated with diabetes ([place]) Controlled Counseled on blood pressure goal of less than 130/80, low-sodium, DASH diet, medication compliance, 150 minutes of moderate intensity exercise per week. Discussed medication compliance, adverse effects. - lisinopril-hydrochlorothiazide (ZESTORETIC) 10-12.5 MG tablet; TAKE 1 TABLET BY MOUTH DAILY.  Dispense: 90 tablet; Refill: 1  3. Other diabetic neurological complication associated with type 2 diabetes mellitus (HCC) Stable - gabapentin (NEURONTIN) 300 MG capsule; take 2 capsules by mouth every  morning and 3 capsules my mouth in the evening  Dispense: 150 capsule; Refill: 1  4. Acute non-recurrent maxillary sinusitis Improving Continue with Zyrtec Nasal irrigation kit provided    Meds ordered this encounter  Medications   Semaglutide, 2 MG/DOSE, 8 MG/3ML SOPN    Sig: Inject 2 mg as directed once a week.    Dispense:  3 mL    Refill:  6    Dose increased   lisinopril-hydrochlorothiazide (ZESTORETIC) 10-12.5 MG tablet    Sig: TAKE 1 TABLET BY MOUTH DAILY.    Dispense:  90 tablet    Refill:  1   gabapentin (NEURONTIN) 300 MG capsule    Sig: take 2 capsules by mouth every morning and 3 capsules my mouth in the evening    Dispense:  150 capsule    Refill:  1    Follow-up: Return in about 3 months (around 08/30/2022) for Chronic medical conditions.       Charlott Rakes, MD, FAAFP. [facility] and [place], [place]   05/30/2022, 11:26 AM

## 2022-05-31 ENCOUNTER — Other Ambulatory Visit: Payer: Self-pay

## 2022-06-06 ENCOUNTER — Other Ambulatory Visit: Payer: Self-pay

## 2022-06-11 ENCOUNTER — Other Ambulatory Visit: Payer: Self-pay

## 2022-06-21 ENCOUNTER — Other Ambulatory Visit: Payer: Self-pay

## 2022-06-25 ENCOUNTER — Other Ambulatory Visit: Payer: Self-pay

## 2022-07-01 ENCOUNTER — Other Ambulatory Visit: Payer: Self-pay

## 2022-07-04 ENCOUNTER — Ambulatory Visit
Admission: RE | Admit: 2022-07-04 | Discharge: 2022-07-04 | Disposition: A | Discharge status: Home / Self Care | Payer: No Typology Code available for payment source | Source: Ambulatory Visit | Attending: Obstetrics and Gynecology | Admitting: Obstetrics and Gynecology

## 2022-07-04 ENCOUNTER — Ambulatory Visit: Payer: Self-pay | Admitting: Hematology and Oncology

## 2022-07-04 VITALS — BP 130/82 | Wt 202.0 lb

## 2022-07-04 DIAGNOSIS — Z1231 Encounter for screening mammogram for malignant neoplasm of breast: Secondary | ICD-10-CM

## 2022-07-04 NOTE — Patient Instructions (Signed)
Taught Carmen Burns about self breast awareness and gave educational materials to take home. Patient did not need a Pap smear today due to last Pap smear was in 2022 per patient.  Let her know [facility] will cover Pap smears every 5 years unless has a history of abnormal Pap smears. [facility]erred [facility] to [facility] for screening mammogram. Appointment scheduled for 07/04/22. Patient aware of appointment and will be there. Let patient know will follow up with her within the next couple weeks with results. Oluwatamilore Martello verbalized understanding.  Pascal Lux, NP 10:36 AM

## 2022-07-04 NOTE — Progress Notes (Signed)
Ms. Rogenia Limon is a 62 y.o. female who presents to [facility] clinic today with no complaints.    Pap Smear: Pap not smear completed today. Last Pap smear was 2022 and was normal. Per patient has no history of an abnormal Pap smear. Last Pap smear result is available in Epic.   Physical exam: Breasts Breasts symmetrical. No skin abnormalities bilateral breasts. No nipple retraction bilateral breasts. No nipple discharge bilateral breasts. No lymphadenopathy. No lumps palpated bilateral breasts.  MS DIGITAL DIAG TOMO UNI LEFT  Result Date: 05/22/2021 CLINICAL DATA:  62 year old female recalled from screening mammogram dated 03/29/2021 for a possible left breast asymmetry. EXAM: DIGITAL DIAGNOSTIC UNILATERAL LEFT MAMMOGRAM WITH TOMOSYNTHESIS AND CAD TECHNIQUE: Left digital diagnostic mammography and breast tomosynthesis was performed. The images were evaluated with computer-aided detection. COMPARISON:  Previous exam(s). ACR Breast Density Category b: There are scattered areas of fibroglandular density. FINDINGS: The previously described, possible asymmetry in the upper left breast is seen on the MLO projection resolves into well dispersed fibroglandular tissue on additional views. No suspicious findings are identified. IMPRESSION: No mammographic evidence of malignancy. RECOMMENDATION: Screening mammogram in one year.(Code:SM-B-01Y) I have discussed the findings and recommendations with the patient. If applicable, a reminder letter will be sent to the patient regarding the next appointment. BI-RADS CATEGORY  1: Negative. Electronically Signed   By: Sande Brothers M.D.   On: 05/22/2021 14:30  MM 3D SCREEN BREAST BILATERAL  Result Date: 03/30/2021 CLINICAL DATA:  Screening. EXAM: DIGITAL SCREENING BILATERAL MAMMOGRAM WITH TOMOSYNTHESIS AND CAD TECHNIQUE: Bilateral screening digital craniocaudal and mediolateral oblique mammograms were obtained. Bilateral screening digital breast tomosynthesis was  performed. The images were evaluated with computer-aided detection. COMPARISON:  Previous exam(s). ACR Breast Density Category b: There are scattered areas of fibroglandular density. FINDINGS: In the left breast, a possible asymmetry warrants further evaluation. In the right breast, no findings suspicious for malignancy. IMPRESSION: Further evaluation is suggested for possible asymmetry in the left breast. RECOMMENDATION: Diagnostic mammogram and possibly ultrasound of the left breast. (Code:FI-L-28M) The patient will be contacted regarding the findings, and additional imaging will be scheduled. BI-RADS CATEGORY  0: Incomplete. Need additional imaging evaluation and/or prior mammograms for comparison. Electronically Signed   By: Gerome Sam III M.D.   On: 03/30/2021 10:10   MS DIGITAL SCREENING TOMO BILATERAL  Result Date: 03/07/2020 CLINICAL DATA:  Screening. EXAM: DIGITAL SCREENING BILATERAL MAMMOGRAM WITH TOMO AND CAD COMPARISON:  Previous exam(s). ACR Breast Density Category b: There are scattered areas of fibroglandular density. FINDINGS: There are no findings suspicious for malignancy. Images were processed with CAD. IMPRESSION: No mammographic evidence of malignancy. A result letter of this screening mammogram will be mailed directly to the patient. RECOMMENDATION: Screening mammogram in one year. (Code:SM-B-01Y) BI-RADS CATEGORY  1: Negative. Electronically Signed   By: Sande Brothers M.D.   On: 03/07/2020 10:18   MS DIGITAL DIAG TOMO BILAT  Result Date: 12/29/2018 CLINICAL DATA:  Diffuse intermittent left breast pain. EXAM: DIGITAL DIAGNOSTIC BILATERAL MAMMOGRAM WITH CAD AND TOMO COMPARISON:  Previous exam(s). ACR Breast Density Category b: There are scattered areas of fibroglandular density. FINDINGS: No suspicious masses, calcifications, or distortion are identified in either breast. Mammographic images were processed with CAD. IMPRESSION: No mammographic evidence of malignancy.  RECOMMENDATION: Treatment of the patient's intermittent diffuse left breast pain should be based on clinical and physical exam given lack of imaging findings. Recommend annual screening mammography. I have discussed the findings and recommendations with the patient. If applicable, a  reminder letter will be sent to the patient regarding the next appointment. BI-RADS CATEGORY  1: Negative. Electronically Signed   By: Gerome Sam III M.D   On: 12/29/2018 11:26         Pelvic/Bimanual Pap is not indicated today    Smoking History: Patient has never smoked and was not referred to quit line.    Patient Navigation: Patient education provided. Access to services provided for patient through [facility] program. Alis interpreter provided. No transportation provided   Colorectal Cancer Screening: Per patient has had colonoscopy completed on 10/26/21 with benign results and will repeat in 5 years.   No complaints today.    Breast and Cervical Cancer Risk Assessment: Patient does not have family history of breast cancer, known genetic mutations, or radiation treatment to the chest before age 64. Patient does not have history of cervical dysplasia, immunocompromised, or DES exposure in-utero.  Risk Assessment   No risk assessment data for the current encounter  Risk Scores       05/22/2021   Last edited by: Meryl Dare, CMA   5-year risk: 0.7 %   Lifetime risk: 3.5 %            A: B[facility]CP exam without pap smear No complaints with benign exam.   P: [facility] screening mamm[facility]. Appointment scheduled 07/04/22.  Ilda Basset A, NP 07/04/2022 10:35 AM

## 2022-07-19 ENCOUNTER — Other Ambulatory Visit: Payer: Self-pay

## 2022-08-28 ENCOUNTER — Ambulatory Visit: Payer: Self-pay | Attending: Family Medicine | Admitting: Family Medicine

## 2022-08-28 ENCOUNTER — Other Ambulatory Visit: Payer: Self-pay

## 2022-08-28 VITALS — BP 123/83 | HR 87 | Ht 65.0 in | Wt 197.8 lb

## 2022-08-28 DIAGNOSIS — E1159 Type 2 diabetes mellitus with other circulatory complications: Secondary | ICD-10-CM

## 2022-08-28 DIAGNOSIS — I152 Hypertension secondary to endocrine disorders: Secondary | ICD-10-CM

## 2022-08-28 DIAGNOSIS — Z7985 Long-term (current) use of injectable non-insulin antidiabetic drugs: Secondary | ICD-10-CM

## 2022-08-28 DIAGNOSIS — R432 Parageusia: Secondary | ICD-10-CM

## 2022-08-28 DIAGNOSIS — E1169 Type 2 diabetes mellitus with other specified complication: Secondary | ICD-10-CM

## 2022-08-28 DIAGNOSIS — E7800 Pure hypercholesterolemia, unspecified: Secondary | ICD-10-CM

## 2022-08-28 DIAGNOSIS — E1149 Type 2 diabetes mellitus with other diabetic neurological complication: Secondary | ICD-10-CM

## 2022-08-28 LAB — POCT GLYCOSYLATED HEMOGLOBIN (HGB A1C): HbA1c, POC (controlled diabetic range): 6.6 % (ref 0.0–7.0)

## 2022-08-28 NOTE — Patient Instructions (Signed)
La diabetes mellitus y el cuidado de los pies Diabetes Mellitus and Foot Care La diabetes, tambin llamada diabetes mellitus, puede causar problemas en los pies y las piernas debido a un flujo sanguneo (circulacin) deficiente. La mala circulacin puede hacer que la piel: Se torne ms fina y seca. Se resquebraje ms fcilmente. Cicatrice ms lentamente. Se descame y agriete. Tambin puede presentar tener dao nervioso (neuropata). Esto puede causar una disminucin de la sensibilidad en las piernas y los pies. En consecuencia, es posible que no advierta heridas pequeas en los pies que pueden causar problemas ms graves. Identificar problemas y tratarlos lo antes posible es la mejor manera de evitar futuros problemas en los pies. Cmo cuidar los pies Higiene de los pies  [employer] pies todos los [address]  [address] con agua tibia y un [place]. No use agua caliente. Luego squese los pies y [employer] dedos dando palmaditas hasta que estn completamente secos. No ponga los pies en remojo. Esto puede secarle la piel. Crtese las uas de los pies en lnea recta. No escarbe debajo de las uas o alrededor [employer]. Lime los bordes de las uas con una lima o esmeril. Aplique una locin hidratante o vaselina en la piel de los pies y en las uas secas y [place]. Use una locin que no contenga alcohol ni fragancias. No aplique locin entre los dedos. Zapatos y calcetines Use calcetines de algodn o medias limpias todos los [place]. Asegrese de que no le [employer] No use medias hasta la rodilla. Estas pueden reducir el flujo de sangre a las piernas. Use zapatos que le queden bien y que sean acolchados. Revise siempre los zapatos antes de ponrselos para asegurarse de que no haya objetos en su interior. Para amoldar los zapatos, clcelos solo algunas horas por da. Esto evitar lesiones en los pies. Heridas, rasguos, durezas y callosidades  Controle sus pies diariamente para observar si hay  ampollas, cortes, moretones, llagas o enrojecimiento. Si no puede ver la planta del pie, use un espejo o pdale ayuda a Engineer, maintenance (IT). No corte las durezas o callosidades, ni trate de quitarlas con medicamentos. Si algo le ha raspado, cortado o lastimado la piel de los pies, mantenga la piel de esa zona limpia y [place]. Puede higienizar estas zonas con agua y un jabn suave. No limpie la zona con agua oxigenada, alcohol ni yodo. Si tiene una herida, un rasguo, una dureza o una callosidad en el pie, revsela varias veces al da para asegurarse de que se est curando y no se infecte. Est atento a los siguientes signos: Enrojecimiento, Optician, dispensing. Lquido o sangre. Calor. Pus o mal olor. Consejos generales No se cruce de piernas. Esto puede disminuir el flujo de sangre a los pies. No use bolsas de agua caliente ni almohadillas trmicas en los pies. Podran causar quemaduras. Si ha perdido la sensibilidad en los pies o las piernas, no sabr lo que le est sucediendo hasta que sea demasiado tarde. Proteja sus pies del calor y del fro con calzado, en la playa o sobre el pavimento caliente. Programe una cita para un examen completo de los pies por lo menos una vez al ao o con ms frecuencia si tiene [employer]. Informe todos los cortes, llagas o moretones a su mdico de inmediato. Dnde obtener ms informacin American Diabetes Association (Asociacin Estadounidense de la Diabetes): diabetes.org Association of Diabetes Care & Education Specialists (Asociacin de Especialistas en Atencin y Educacin sobre la Diabetes): diabeteseducator.org Comunquese con un  mdico si: Tiene una afeccin que aumenta su riesgo de tener infecciones y tiene cortes, llagas o moretones en los pies. Tiene una lesin que no se [place]. Tiene una zona irritada en las piernas o los pies. Siente una sensacin de ardor u hormigueo en las piernas o los pies. Siente dolor o calambres en las piernas o los pies. Las  piernas o los pies estn adormecidos. Siente los pies siempre fros. Siente dolor alrededor de [place] ua del pie. Solicite ayuda de inmediato si: Tiene una herida, un rasguo, una dureza o una callosidad en el pie y: [place] signos de infeccin. Tiene fiebre. Tiene una lnea roja que sube por la pierna. Esta informacin no tiene Theme park manager el consejo del mdico. Asegrese de hacerle al mdico cualquier pregunta que tenga. Document Revised: 10/02/2021 Document Reviewed: 10/02/2021 Elsevier Patient Education  2024 [employer].

## 2022-08-28 NOTE — Progress Notes (Signed)
Bitter taste in mouth

## 2022-08-28 NOTE — Progress Notes (Signed)
Subjective:  Patient ID: Carmen Burns, female    DOB: [birth year]  Age: 62 y.o. MRN: [id number]  CC: Diabetes   HPI Carmen Burns is a 62 y.o. year old female with a history of  type 2 diabetes mellitus (A1c 6.6), Diabetic neuropathy, Hypertension, Hyperlipidemia, GERD.     Interval History:  She Complains of a 'bitter taste in her mouth' for the last 2 weeks. Symptoms have only been present in her tongue but she denies presence of febrile illness.  She denies presence of sinus symptoms.  She also has a history of reflux but states this has been controlled.  A1c is 6.6 down from 8.3 and she is doing well on her current medications.  She has had no hypoglycemia.  Neuropathy is controlled on her current regimen. Doing well on her antihypertensive and her statin. Past Medical History:  Diagnosis Date   Allergy    seasonal   Anemia 03/26/2011   Diabetes mellitus without complication (HCC)    GERD (gastroesophageal reflux disease)    Hyperlipidemia    Hypertension    Neuromuscular disorder (HCC)    neuropathy   PONV (postoperative nausea and vomiting)    Refusal of blood transfusions as patient is Jehovah's Witness     Past Surgical History:  Procedure Laterality Date   APPENDECTOMY     COLONOSCOPY     DILITATION & CURRETTAGE/HYSTROSCOPY WITH NOVASURE ABLATION N/A 01/12/2013   Procedure: DILATATION & CURETTAGE/HYSTEROSCOPY WITH NOVASURE ABLATION;  Surgeon: Tereso Newcomer, MD;  Location: [facility];  Service: Gynecology;  Laterality: N/A;   POLYPECTOMY      Family History  Problem Relation Age of Onset   Diabetes Father    Stroke Sister    Diabetes Brother    Hypertension Brother    Diabetes Brother    Diabetes Brother    Diabetes Brother    Diabetes Brother    Colon cancer Neg Hx    Breast cancer Neg Hx    Rectal cancer Neg Hx    Stomach cancer Neg Hx    Esophageal cancer Neg Hx    Colon polyps Neg Hx     Social History   Socioeconomic  History   Marital status: Married    Spouse name: Not on file   Number of children: 4   Years of education: Not on file   Highest education level: 9th grade  Occupational History   Not on file  Tobacco Use   Smoking status: Never   Smokeless tobacco: Never  Vaping Use   Vaping Use: Never used  Substance and Sexual Activity   Alcohol use: Yes    Alcohol/week: 1.0 standard drink of alcohol    Types: 1 Glasses of wine per week    Comment: socially   Drug use: No   Sexual activity: Yes    Birth control/protection: Surgical    Comment: husband vasectomy  Other Topics Concern   Not on file  Social History Narrative   Not on file   Social Determinants of Health   Financial Resource Strain: Not on file  Food Insecurity: Food Insecurity Present (07/04/2022)   Hunger Vital Sign    Worried About Running Out of Food in the Last Year: Sometimes true    Ran Out of Food in the Last Year: Sometimes true  Transportation Needs: No Transportation Needs (07/04/2022)   PRAPARE - Administrator, Civil Service (Medical): No    Lack of Transportation (Non-Medical): No  Physical Activity: Not on file  Stress: Not on file  Social Connections: Not on file    Allergies  Allergen Reactions   Penicillins Itching and Rash    Outpatient Medications Prior to Visit  Medication Sig Dispense Refill   Blood Glucose Monitoring Suppl (TRUE METRIX METER) w/Device KIT USE TO CHECK BLOOD SUGAR THREE TIMES DAILY AND AT BEDTIME 1 kit 0   cetirizine (ZYRTEC) 10 MG tablet Take 1 tablet (10 mg total) by mouth daily. 30 tablet 3   COVID-19 mRNA vaccine 2023-2024 (COMIRNATY) syringe Inject 0.3 mLs into the muscle. 0.3 mL 0   gabapentin (NEURONTIN) 300 MG capsule Take 2 capsules (600 mg total) by mouth every morning AND 3 capsules (900 mg total) every evening. 150 capsule 1   glipiZIDE (GLUCOTROL) 10 MG tablet Take 2 tablets (20 mg total) by mouth 2 (two) times daily before a meal. 120 tablet 6    glucose blood (TRUE METRIX BLOOD GLUCOSE TEST) test strip USE TO CHECK BLOOD SUGAR THREE TIMES DAILY AND AT BEDTIME 100 each 11   lisinopril-hydrochlorothiazide (ZESTORETIC) 10-12.5 MG tablet TAKE 1 TABLET BY MOUTH DAILY. 90 tablet 1   meloxicam (MOBIC) 7.5 MG tablet Take 1 tablet (7.5 mg total) by mouth daily. 30 tablet 1   olopatadine (PATANOL) 0.1 % ophthalmic solution Place 1 drop into both eyes 2 (two) times daily. 5 mL 1   PEG-KCl-NaCl-NaSulf-Na Asc-C (PLENVU) 140 g SOLR Take 1 kit by mouth as directed. 1 each 0   Psyllium (METAMUCIL) 28.3 % POWD metamucil s/f s/text orange 30 dose 6.1 oz  DISSOLVE ONE TEASPOONFUL IN EACH NOSTRIL EIGHT OUNCE OF WATER AND BEBER 1-3 VECES AL DIA     Semaglutide, 2 MG/DOSE, 8 MG/3ML SOPN Inject 2 mg as directed once a week. 3 mL 6   TRUEPLUS LANCETS 28G MISC USE TO CHECK BLOOD SUGAR THREE TIMES DAILY AND AT BEDTIME 100 each 2   atorvastatin (LIPITOR) 40 MG tablet TAKE 1 TABLET (40 MG TOTAL) BY MOUTH DAILY. 90 tablet 1   pantoprazole (PROTONIX) 40 MG tablet TAKE 1 TABLET (40 MG TOTAL) BY MOUTH DAILY. 30 tablet 6   Facility-Administered Medications Prior to Visit  Medication Dose Route Frequency Provider Last Rate Last Admin   0.9 %  sodium chloride infusion  500 mL Intravenous Continuous Armbruster, Willaim Rayas, MD         ROS Review of Systems  Constitutional:  Negative for activity change and appetite change.  HENT:  Negative for sinus pressure and sore throat.   Respiratory:  Negative for chest tightness, shortness of breath and wheezing.   Cardiovascular:  Negative for chest pain and palpitations.  Gastrointestinal:  Negative for abdominal distention, abdominal pain and constipation.  Genitourinary: Negative.   Musculoskeletal: Negative.   Psychiatric/Behavioral:  Negative for behavioral problems and dysphoric mood.     Objective:  BP 123/83   Pulse 87   Ht 5\' 5"  (1.651 m)   Wt 197 lb 12.8 oz (89.7 kg)   LMP 06/20/2015 Comment: no chance  pregnant,husband has had vasectomy pt sates  SpO2 100%   BMI 32.92 kg/m      08/28/2022   10:56 AM 07/04/2022   10:31 AM 05/30/2022   10:57 AM  BP/Weight  Systolic BP 123 130 116  Diastolic BP 83 82 79  Wt. (Lbs) 197.8 202 201.4  BMI 32.92 kg/m2 33.61 kg/m2 33.51 kg/m2      Physical Exam Constitutional:      Appearance: She is well-developed.  HENT:     Right Ear: Tympanic membrane normal.     Left Ear: Tympanic membrane normal.     Mouth/Throat:     Mouth: Mucous membranes are moist.  Cardiovascular:     Rate and Rhythm: Normal rate.     Heart sounds: Normal heart sounds. No murmur heard. Pulmonary:     Effort: Pulmonary effort is normal.     Breath sounds: Normal breath sounds. No wheezing or rales.  Chest:     Chest wall: No tenderness.  Abdominal:     General: Bowel sounds are normal. There is no distension.     Palpations: Abdomen is soft. There is no mass.     Tenderness: There is no abdominal tenderness.  Musculoskeletal:        General: Normal range of motion.     Right lower leg: No edema.     Left lower leg: No edema.  Neurological:     Mental Status: She is alert and oriented to person, place, and time.  Psychiatric:        Mood and Affect: Mood normal.    Diabetic Foot Exam - Simple   Simple Foot Form Diabetic Foot exam was performed with the following findings: Yes 08/28/2022 11:34 AM  Visual Inspection No deformities, no ulcerations, no other skin breakdown bilaterally: Yes Sensation Testing Intact to touch and monofilament testing bilaterally: Yes Pulse Check Posterior Tibialis and Dorsalis pulse intact bilaterally: Yes Comments        Latest Ref Rng & Units 04/19/2021    3:28 PM 12/21/2020   12:00 PM 05/16/2020   11:51 AM  CMP  Glucose 70 - 99 mg/dL 161  096  045   BUN 8 - 27 mg/dL 12  14  9    Creatinine 0.57 - 1.00 mg/dL 4.09  8.11  9.14   Sodium 134 - 144 mmol/L 137  141  138   Potassium 3.5 - 5.2 mmol/L 4.1  4.7  5.0   Chloride 96 -  106 mmol/L 101  103  100   CO2 20 - 29 mmol/L  23  22   Calcium 8.7 - 10.3 mg/dL 78.2  9.5  9.8   Total Protein 6.0 - 8.5 g/dL 7.3  7.8  7.7   Total Bilirubin 0.0 - 1.2 mg/dL 0.5  0.5  0.5   Alkaline Phos 44 - 121 IU/L 139  110  113   AST 0 - 40 IU/L 21  21  23    ALT 0 - 32 IU/L  18  18     Lipid Panel     Component Value Date/Time   CHOL 164 05/31/2021 1139   TRIG 99 05/31/2021 1139   HDL 65 05/31/2021 1139   CHOLHDL 3.0 12/21/2020 1200   CHOLHDL 3.2 01/19/2016 1043   VLDL 31 (H) 01/19/2016 1043   LDLCALC 81 05/31/2021 1139    CBC    Component Value Date/Time   WBC 6.9 04/19/2021 1528   WBC 8.4 03/22/2015 0918   RBC 4.90 04/19/2021 1528   RBC 4.61 03/22/2015 0918   HGB 14.5 04/19/2021 1528   HCT 43.4 04/19/2021 1528   PLT 217 04/19/2021 1528   MCV 89 04/19/2021 1528   MCH 29.6 04/19/2021 1528   MCH 29.9 03/22/2015 0918   MCHC 33.4 04/19/2021 1528   MCHC 32.9 03/22/2015 0918   RDW 12.2 04/19/2021 1528   LYMPHSABS 2.6 04/19/2021 1528   MONOABS 0.6 03/22/2015 0918   EOSABS 0.1 04/19/2021 1528  BASOSABS 0.1 04/19/2021 1528    Lab Results  Component Value Date   HGBA1C 6.6 08/28/2022    Assessment & Plan:  1. Type 2 diabetes mellitus with other specified complication, without long-term current use of insulin (HCC) Controlled with A1c of 6.6 which is down from 8.3 previously She has been commended on improvement Continue with current regimen Counseled on Diabetic diet, my plate method, 161 minutes of moderate intensity exercise/week Blood sugar logs with fasting goals of 80-120 mg/dl, random of less than 096 and in the event of sugars less than 60 mg/dl or greater than 045 mg/dl encouraged to notify the clinic. Advised on the need for annual eye exams, annual foot exams, Pneumonia vaccine. - POCT glycosylated hemoglobin (Hb A1C) - Microalbumin / creatinine urine ratio - LP+Non-HDL Cholesterol - CMP14+EGFR  2. Dysgeusia Unknown etiology in the absence of  febrile illness, controlled reflux symptoms, no sinus symptoms - Vitamin B12  3. Hypertension associated with diabetes (HCC) Controlled Continue lisinopril/HCTZ Counseled on blood pressure goal of less than 130/80, low-sodium, DASH diet, medication compliance, 150 minutes of moderate intensity exercise per week. Discussed medication compliance, adverse effects.  4. Other diabetic neurological complication associated with type 2 diabetes mellitus (HCC) Controlled on gabapentin  5. Pure hypercholesterolemia Low-cholesterol diet   No orders of the defined types were placed in this encounter.   Follow-up: Return in about 6 months (around 02/27/2023) for Chronic medical conditions.       Hoy Register, MD, FAAFP. [facility] [place], [place] [phone]   08/28/2022, 11:41 AM

## 2022-08-29 LAB — LP+NON-HDL CHOLESTEROL
Cholesterol, Total: 190 mg/dL (ref 100–199)
HDL: 56 mg/dL (ref >39)
LDL Chol Calc (NIH): 111 mg/dL — ABNORMAL HIGH (ref 0–99)
Total Non-HDL-Chol (LDL+VLDL): 134 mg/dL — ABNORMAL HIGH (ref 0–129)
Triglycerides: 132 mg/dL (ref 0–149)
VLDL Cholesterol Cal: 23 mg/dL (ref 5–40)

## 2022-08-29 LAB — CMP14+EGFR
ALT: 20 IU/L (ref 0–32)
AST: 20 IU/L (ref 0–40)
Albumin/Globulin Ratio: 1.4 (ref 1.2–2.2)
Albumin: 4.2 g/dL (ref 3.9–4.9)
Alkaline Phosphatase: 106 IU/L (ref 44–121)
BUN/Creatinine Ratio: 16 (ref 12–28)
BUN: 12 mg/dL (ref 8–27)
Bilirubin Total: 0.5 mg/dL (ref 0.0–1.2)
CO2: 23 mmol/L (ref 20–29)
Calcium: 9.4 mg/dL (ref 8.7–10.3)
Chloride: 104 mmol/L (ref 96–106)
Creatinine, Ser: 0.76 mg/dL (ref 0.57–1.00)
Globulin, Total: 2.9 g/dL (ref 1.5–4.5)
Glucose: 112 mg/dL — ABNORMAL HIGH (ref 70–99)
Potassium: 4.1 mmol/L (ref 3.5–5.2)
Sodium: 139 mmol/L (ref 134–144)
Total Protein: 7.1 g/dL (ref 6.0–8.5)
eGFR: 88 mL/min/{1.73_m2} (ref >59)

## 2022-08-29 LAB — MICROALBUMIN / CREATININE URINE RATIO
Creatinine, Urine: 85.2 mg/dL
Microalb/Creat Ratio: 4 mg/g creat (ref 0–29)
Microalbumin, Urine: 3 ug/mL

## 2022-08-29 LAB — VITAMIN B12: Vitamin B-12: 206 pg/mL — ABNORMAL LOW (ref 232–1245)

## 2022-08-30 ENCOUNTER — Other Ambulatory Visit: Payer: Self-pay

## 2022-08-30 ENCOUNTER — Other Ambulatory Visit: Payer: Self-pay | Admitting: Family Medicine

## 2022-08-30 DIAGNOSIS — E7800 Pure hypercholesterolemia, unspecified: Secondary | ICD-10-CM

## 2022-08-30 MED ORDER — VITAMIN B-12 1000 MCG PO TABS
1000.0000 ug | ORAL_TABLET | Freq: Every day | ORAL | 1 refills | Status: DC
  Filled 2022-08-30: qty 130, 130d supply, fill #0

## 2022-08-30 MED ORDER — ATORVASTATIN CALCIUM 80 MG PO TABS
80.0000 mg | ORAL_TABLET | Freq: Every day | ORAL | 1 refills | Status: DC
  Filled 2022-08-30: qty 90, 90d supply, fill #0

## 2022-09-03 ENCOUNTER — Other Ambulatory Visit: Payer: Self-pay | Admitting: Family Medicine

## 2022-09-03 ENCOUNTER — Other Ambulatory Visit: Payer: Self-pay

## 2022-09-03 DIAGNOSIS — K219 Gastro-esophageal reflux disease without esophagitis: Secondary | ICD-10-CM

## 2022-09-03 MED ORDER — PANTOPRAZOLE SODIUM 40 MG PO TBEC
40.0000 mg | DELAYED_RELEASE_TABLET | Freq: Every day | ORAL | 0 refills | Status: DC
Start: 2022-09-03 — End: 2023-03-04
  Filled 2022-09-03: qty 90, 90d supply, fill #0

## 2022-09-06 ENCOUNTER — Other Ambulatory Visit: Payer: Self-pay

## 2022-09-09 ENCOUNTER — Other Ambulatory Visit: Payer: Self-pay

## 2022-09-27 ENCOUNTER — Other Ambulatory Visit: Payer: Self-pay

## 2022-10-10 ENCOUNTER — Other Ambulatory Visit: Payer: Self-pay

## 2022-10-16 ENCOUNTER — Other Ambulatory Visit: Payer: Self-pay

## 2022-10-21 ENCOUNTER — Other Ambulatory Visit: Payer: Self-pay

## 2022-12-13 ENCOUNTER — Other Ambulatory Visit: Payer: Self-pay

## 2022-12-13 ENCOUNTER — Other Ambulatory Visit: Payer: Self-pay | Admitting: Family Medicine

## 2022-12-13 DIAGNOSIS — E1169 Type 2 diabetes mellitus with other specified complication: Secondary | ICD-10-CM

## 2022-12-13 MED ORDER — OZEMPIC (2 MG/DOSE) 8 MG/3ML ~~LOC~~ SOPN
2.0000 mg | PEN_INJECTOR | SUBCUTANEOUS | 1 refills | Status: DC
Start: 2022-12-13 — End: 2023-05-29
  Filled 2022-12-13: qty 6, 56d supply, fill #0
  Filled 2023-02-10: qty 3, 28d supply, fill #1
  Filled 2023-03-04 (×2): qty 3, 28d supply, fill #2
  Filled 2023-04-07: qty 3, 28d supply, fill #3
  Filled 2023-04-29 (×2): qty 3, 28d supply, fill #4

## 2022-12-16 ENCOUNTER — Other Ambulatory Visit: Payer: Self-pay

## 2023-01-16 ENCOUNTER — Other Ambulatory Visit: Payer: Self-pay | Admitting: Family Medicine

## 2023-01-16 ENCOUNTER — Other Ambulatory Visit: Payer: Self-pay

## 2023-01-16 DIAGNOSIS — I152 Hypertension secondary to endocrine disorders: Secondary | ICD-10-CM

## 2023-01-17 ENCOUNTER — Other Ambulatory Visit: Payer: Self-pay

## 2023-01-17 MED ORDER — LISINOPRIL-HYDROCHLOROTHIAZIDE 10-12.5 MG PO TABS
1.0000 | ORAL_TABLET | Freq: Every day | ORAL | 0 refills | Status: DC
Start: 2023-01-17 — End: 2023-03-04
  Filled 2023-01-17: qty 90, 90d supply, fill #0

## 2023-02-10 ENCOUNTER — Other Ambulatory Visit: Payer: Self-pay

## 2023-02-14 ENCOUNTER — Other Ambulatory Visit: Payer: Self-pay

## 2023-03-03 ENCOUNTER — Other Ambulatory Visit: Payer: Self-pay

## 2023-03-04 ENCOUNTER — Ambulatory Visit: Payer: Self-pay | Attending: Family Medicine | Admitting: Family Medicine

## 2023-03-04 ENCOUNTER — Other Ambulatory Visit: Payer: Self-pay

## 2023-03-04 ENCOUNTER — Encounter: Payer: Self-pay | Admitting: Family Medicine

## 2023-03-04 VITALS — BP 114/77 | HR 86 | Ht 65.0 in | Wt 194.6 lb

## 2023-03-04 DIAGNOSIS — E1169 Type 2 diabetes mellitus with other specified complication: Secondary | ICD-10-CM

## 2023-03-04 DIAGNOSIS — Z7984 Long term (current) use of oral hypoglycemic drugs: Secondary | ICD-10-CM

## 2023-03-04 DIAGNOSIS — I152 Hypertension secondary to endocrine disorders: Secondary | ICD-10-CM

## 2023-03-04 DIAGNOSIS — K219 Gastro-esophageal reflux disease without esophagitis: Secondary | ICD-10-CM

## 2023-03-04 DIAGNOSIS — Z23 Encounter for immunization: Secondary | ICD-10-CM

## 2023-03-04 DIAGNOSIS — E1159 Type 2 diabetes mellitus with other circulatory complications: Secondary | ICD-10-CM

## 2023-03-04 DIAGNOSIS — E538 Deficiency of other specified B group vitamins: Secondary | ICD-10-CM

## 2023-03-04 DIAGNOSIS — E1149 Type 2 diabetes mellitus with other diabetic neurological complication: Secondary | ICD-10-CM

## 2023-03-04 DIAGNOSIS — Z7985 Long-term (current) use of injectable non-insulin antidiabetic drugs: Secondary | ICD-10-CM

## 2023-03-04 DIAGNOSIS — E7800 Pure hypercholesterolemia, unspecified: Secondary | ICD-10-CM

## 2023-03-04 LAB — POCT GLYCOSYLATED HEMOGLOBIN (HGB A1C): HbA1c, POC (controlled diabetic range): 6.3 % (ref 0.0–7.0)

## 2023-03-04 MED ORDER — PANTOPRAZOLE SODIUM 40 MG PO TBEC
40.0000 mg | DELAYED_RELEASE_TABLET | Freq: Every day | ORAL | 1 refills | Status: DC
Start: 2023-03-04 — End: 2024-01-21
  Filled 2023-03-04: qty 90, 90d supply, fill #0
  Filled 2023-09-03: qty 90, 90d supply, fill #1

## 2023-03-04 MED ORDER — VITAMIN B-12 1000 MCG PO TABS
1000.0000 ug | ORAL_TABLET | Freq: Every day | ORAL | 1 refills | Status: AC
  Filled 2023-03-04: qty 130, 130d supply, fill #0
  Filled 2024-01-21: qty 130, 130d supply, fill #1

## 2023-03-04 MED ORDER — GABAPENTIN 300 MG PO CAPS
600.0000 mg | ORAL_CAPSULE | Freq: Every day | ORAL | 1 refills | Status: AC
Start: 2023-03-04 — End: ?
  Filled 2023-03-04: qty 180, 90d supply, fill #0
  Filled 2023-09-03: qty 180, 90d supply, fill #1

## 2023-03-04 MED ORDER — GLIPIZIDE 10 MG PO TABS
20.0000 mg | ORAL_TABLET | Freq: Two times a day (BID) | ORAL | 6 refills | Status: DC
Start: 2023-03-04 — End: 2023-07-10
  Filled 2023-03-04: qty 120, 30d supply, fill #0

## 2023-03-04 MED ORDER — LISINOPRIL-HYDROCHLOROTHIAZIDE 10-12.5 MG PO TABS
1.0000 | ORAL_TABLET | Freq: Every day | ORAL | 1 refills | Status: DC
Start: 2023-03-04 — End: 2023-12-19
  Filled 2023-03-04 – 2023-04-29 (×2): qty 90, 90d supply, fill #0
  Filled 2023-09-03: qty 90, 90d supply, fill #1

## 2023-03-04 NOTE — Progress Notes (Signed)
Subjective:  Patient ID: Carmen Burns, female    DOB: 22-Jun-1960  Age: 62 y.o. MRN: [id number]  CC: Medical Management of Chronic Issues   HPI Carmen Burns is a 62 y.o. year old female with a history of  type 2 diabetes mellitus (A1c 6.3), Diabetic neuropathy, Hypertension, Hyperlipidemia, GERD.     Interval History: Discussed the use of AI scribe software for clinical note transcription with the patient, who gave verbal consent to proceed.  She presents for a routine follow-up. She reports no specific concerns today. Her recent HbA1c is 6.3, down from 6.6, indicating good control of her diabetes. She denies experiencing hypoglycemic symptoms such as lightheadedness or sweating. She is currently on glipizide and weekly Ozempic injections, with which she reports no issues.  She also takes gabapentin for neuropathy, two capsules at night due to drowsiness as a side effect. She reports that the gabapentin effectively controls her symptoms. She also has a prescription for acid reflux medication, which she continues to take and requests a refill for.  She has been prescribed vitamin B12 for a deficiency noted in her last blood test, as she had also Complained of an abnormal taste in her mouth. However, she has not yet picked up this prescription from the pharmacy. She has recently had an eye exam and is due to pick up her new glasses.  She exercises regularly, specifically dancing to tropical music, which she enjoys. She has received the COVID-19 vaccine six months ago and inquires about the frequency of future doses.  She agrees to receive the flu vaccine today.        Past Medical History:  Diagnosis Date   Allergy    seasonal   Anemia 03/26/2011   Diabetes mellitus without complication (HCC)    GERD (gastroesophageal reflux disease)    Hyperlipidemia    Hypertension    Neuromuscular disorder (HCC)    neuropathy   PONV (postoperative nausea and vomiting)     Refusal of blood transfusions as patient is Jehovah's Witness     Past Surgical History:  Procedure Laterality Date   APPENDECTOMY     COLONOSCOPY     DILITATION & CURRETTAGE/HYSTROSCOPY WITH NOVASURE ABLATION N/A 01/12/2013   Procedure: DILATATION & CURETTAGE/HYSTEROSCOPY WITH NOVASURE ABLATION;  Surgeon: Tereso Newcomer, MD;  Location: [facility];  Service: Gynecology;  Laterality: N/A;   POLYPECTOMY      Family History  Problem Relation Age of Onset   Diabetes Father    Stroke Sister    Diabetes Brother    Hypertension Brother    Diabetes Brother    Diabetes Brother    Diabetes Brother    Diabetes Brother    Colon cancer Neg Hx    Breast cancer Neg Hx    Rectal cancer Neg Hx    Stomach cancer Neg Hx    Esophageal cancer Neg Hx    Colon polyps Neg Hx     Social History   Socioeconomic History   Marital status: Married    Spouse name: Not on file   Number of children: 4   Years of education: Not on file   Highest education level: 9th grade  Occupational History   Not on file  Tobacco Use   Smoking status: Never   Smokeless tobacco: Never  Vaping Use   Vaping status: Never Used  Substance and Sexual Activity   Alcohol use: Yes    Alcohol/week: 1.0 standard drink of alcohol    Types:  1 Glasses of wine per week    Comment: socially   Drug use: No   Sexual activity: Yes    Birth control/protection: Surgical    Comment: husband vasectomy  Other Topics Concern   Not on file  Social History Narrative   Not on file   Social Determinants of Health   Financial Resource Strain: Low Risk  (03/04/2023)   Overall Financial Resource Strain (CARDIA)    Difficulty of Paying Living Expenses: Not very hard  Food Insecurity: Patient Declined (03/04/2023)   Hunger Vital Sign    Worried About Running Out of Food in the Last Year: Patient declined    Ran Out of Food in the Last Year: Patient declined  Transportation Needs: No Transportation Needs (03/04/2023)   PRAPARE  - Administrator, Civil Service (Medical): No    Lack of Transportation (Non-Medical): No  Physical Activity: Insufficiently Active (03/04/2023)   Exercise Vital Sign    Days of Exercise per Week: 2 days    Minutes of Exercise per Session: 30 min  Stress: No Stress Concern Present (03/04/2023)   [employer] of Occupational Health - Occupational Stress Questionnaire    Feeling of Stress : Not at all  Social Connections: Socially Integrated (03/04/2023)   Social Connection and Isolation Panel [NHANES]    Frequency of Communication with Friends and Family: More than three times a week    Frequency of Social Gatherings with Friends and Family: More than three times a week    Attends Religious Services: More than 4 times per year    Active Member of [employer] or Organizations: Yes    Attends Engineer, structural: More than 4 times per year    Marital Status: Married    Allergies  Allergen Reactions   Penicillins Itching and Rash    Outpatient Medications Prior to Visit  Medication Sig Dispense Refill   atorvastatin (LIPITOR) 80 MG tablet Take 1 tablet (80 mg total) by mouth daily. 90 tablet 1   Blood Glucose Monitoring Suppl (TRUE METRIX METER) w/Device KIT USE TO CHECK BLOOD SUGAR THREE TIMES DAILY AND AT BEDTIME 1 kit 0   cetirizine (ZYRTEC) 10 MG tablet Take 1 tablet (10 mg total) by mouth daily. 30 tablet 3   COVID-19 mRNA vaccine 2023-2024 (COMIRNATY) syringe Inject 0.3 mLs into the muscle. 0.3 mL 0   glucose blood (TRUE METRIX BLOOD GLUCOSE TEST) test strip USE TO CHECK BLOOD SUGAR THREE TIMES DAILY AND AT BEDTIME 100 each 11   meloxicam (MOBIC) 7.5 MG tablet Take 1 tablet (7.5 mg total) by mouth daily. 30 tablet 1   olopatadine (PATANOL) 0.1 % ophthalmic solution Place 1 drop into both eyes 2 (two) times daily. 5 mL 1   PEG-KCl-NaCl-NaSulf-Na Asc-C (PLENVU) 140 g SOLR Take 1 kit by mouth as directed. 1 each 0   Psyllium (METAMUCIL) 28.3 % POWD metamucil s/f  s/text orange 30 dose 6.1 oz  DISSOLVE ONE TEASPOONFUL IN EACH NOSTRIL EIGHT OUNCE OF WATER AND BEBER 1-3 VECES AL DIA     Semaglutide, 2 MG/DOSE, (OZEMPIC, 2 MG/DOSE,) 8 MG/3ML SOPN Inject 2 mg as directed once a week. 9 mL 1   TRUEPLUS LANCETS 28G MISC USE TO CHECK BLOOD SUGAR THREE TIMES DAILY AND AT BEDTIME 100 each 2   cyanocobalamin (VITAMIN B12) 1000 MCG tablet Take 1 tablet (1,000 mcg total) by mouth daily. 130 tablet 1   gabapentin (NEURONTIN) 300 MG capsule Take 2 capsules (600 mg total) by  mouth every morning AND 3 capsules (900 mg total) every evening. 150 capsule 1   glipiZIDE (GLUCOTROL) 10 MG tablet Take 2 tablets (20 mg total) by mouth 2 (two) times daily before a meal. 120 tablet 6   lisinopril-hydrochlorothiazide (ZESTORETIC) 10-12.5 MG tablet TAKE 1 TABLET BY MOUTH DAILY. 90 tablet 0   pantoprazole (PROTONIX) 40 MG tablet Take 1 tablet (40 mg total) by mouth daily. 90 tablet 0   0.9 %  sodium chloride infusion      No facility-administered medications prior to visit.     ROS Review of Systems  Constitutional:  Negative for activity change and appetite change.  HENT:  Negative for sinus pressure and sore throat.   Respiratory:  Negative for chest tightness, shortness of breath and wheezing.   Cardiovascular:  Negative for chest pain and palpitations.  Gastrointestinal:  Negative for abdominal distention, abdominal pain and constipation.  Genitourinary: Negative.   Musculoskeletal: Negative.   Psychiatric/Behavioral:  Negative for behavioral problems and dysphoric mood.     Objective:  BP 114/77   Pulse 86   Ht 5\' 5"  (1.651 m)   Wt 194 lb 9.6 oz (88.3 kg)   LMP 06/20/2015 Comment: no chance pregnant,husband has had vasectomy pt sates  SpO2 100%   BMI 32.38 kg/m      03/04/2023   10:56 AM 08/28/2022   10:56 AM 07/04/2022   10:31 AM  BP/Weight  Systolic BP 114 123 130  Diastolic BP 77 83 82  Wt. (Lbs) 194.6 197.8 202  BMI 32.38 kg/m2 32.92 kg/m2 33.61 kg/m2       Physical Exam Constitutional:      Appearance: She is well-developed.  Cardiovascular:     Rate and Rhythm: Normal rate.     Heart sounds: Normal heart sounds. No murmur heard. Pulmonary:     Effort: Pulmonary effort is normal.     Breath sounds: Normal breath sounds. No wheezing or rales.  Chest:     Chest wall: No tenderness.  Abdominal:     General: Bowel sounds are normal. There is no distension.     Palpations: Abdomen is soft. There is no mass.     Tenderness: There is no abdominal tenderness.  Musculoskeletal:        General: Normal range of motion.     Right lower leg: No edema.     Left lower leg: No edema.  Neurological:     Mental Status: She is alert and oriented to person, place, and time.  Psychiatric:        Mood and Affect: Mood normal.        Latest Ref Rng & Units 08/28/2022   11:53 AM 04/19/2021    3:28 PM 12/21/2020   12:00 PM  CMP  Glucose 70 - 99 mg/dL 098  119  147   BUN 8 - 27 mg/dL 12  12  14    Creatinine 0.57 - 1.00 mg/dL 8.29  5.62  1.30   Sodium 134 - 144 mmol/L 139  137  141   Potassium 3.5 - 5.2 mmol/L 4.1  4.1  4.7   Chloride 96 - 106 mmol/L 104  101  103   CO2 20 - 29 mmol/L 23   23   Calcium 8.7 - 10.3 mg/dL 9.4  86.5  9.5   Total Protein 6.0 - 8.5 g/dL 7.1  7.3  7.8   Total Bilirubin 0.0 - 1.2 mg/dL 0.5  0.5  0.5   Alkaline Phos 44 -  121 IU/L 106  139  110   AST 0 - 40 IU/L 20  21  21    ALT 0 - 32 IU/L 20   18     Lipid Panel     Component Value Date/Time   CHOL 190 08/28/2022 1153   TRIG 132 08/28/2022 1153   HDL 56 08/28/2022 1153   CHOLHDL 3.0 12/21/2020 1200   CHOLHDL 3.2 01/19/2016 1043   VLDL 31 (H) 01/19/2016 1043   LDLCALC 111 (H) 08/28/2022 1153    CBC    Component Value Date/Time   WBC 6.9 04/19/2021 1528   WBC 8.4 03/22/2015 0918   RBC 4.90 04/19/2021 1528   RBC 4.61 03/22/2015 0918   HGB 14.5 04/19/2021 1528   HCT 43.4 04/19/2021 1528   PLT 217 04/19/2021 1528   MCV 89 04/19/2021 1528   MCH 29.6  04/19/2021 1528   MCH 29.9 03/22/2015 0918   MCHC 33.4 04/19/2021 1528   MCHC 32.9 03/22/2015 0918   RDW 12.2 04/19/2021 1528   LYMPHSABS 2.6 04/19/2021 1528   MONOABS 0.6 03/22/2015 0918   EOSABS 0.1 04/19/2021 1528   BASOSABS 0.1 04/19/2021 1528    Lab Results  Component Value Date   HGBA1C 6.3 03/04/2023    Assessment & Plan:      Type 2 Diabetes Mellitus Improved glycemic control with A1c of 6.3, down from 6.6. No reported hypoglycemic symptoms. -Continue current regimen of Glipizide and Ozempic. -Check A1c in 6 months.  Peripheral Neuropathy Controlled with Gabapentin 600mg  at bedtime. -Continue Gabapentin 600mg  at bedtime.  Gastroesophageal Reflux Disease No current symptoms reported. -Refill prescription for acid reflux medication.  Vitamin B12 Deficiency Low levels noted on previous blood test, prescription sent to pharmacy but not picked up. -Resend prescription for Vitamin B12 to pharmacy.  General Health Maintenance -Administer influenza vaccine today. -Next COVID-19 vaccine due in 6 months. -Continue regular exercise regimen. -Follow-up in 6 months.          Meds ordered this encounter  Medications   gabapentin (NEURONTIN) 300 MG capsule    Sig: Take 2 capsules (600 mg total) by mouth at bedtime.    Dispense:  180 capsule    Refill:  1   glipiZIDE (GLUCOTROL) 10 MG tablet    Sig: Take 2 tablets (20 mg total) by mouth 2 (two) times daily before a meal.    Dispense:  120 tablet    Refill:  6    Dose increase   lisinopril-hydrochlorothiazide (ZESTORETIC) 10-12.5 MG tablet    Sig: TAKE 1 TABLET BY MOUTH DAILY.    Dispense:  90 tablet    Refill:  1   cyanocobalamin (VITAMIN B12) 1000 MCG tablet    Sig: Take 1 tablet (1,000 mcg total) by mouth daily.    Dispense:  130 tablet    Refill:  1   pantoprazole (PROTONIX) 40 MG tablet    Sig: Take 1 tablet (40 mg total) by mouth daily.    Dispense:  90 tablet    Refill:  1    Follow-up: Return in  about 6 months (around 09/02/2023) for Chronic medical conditions.       Hoy Register, MD, FAAFP. [facility] [place], [place] [phone]   03/04/2023, 11:41 AM

## 2023-03-04 NOTE — Patient Instructions (Signed)
 Hacer ejercicio para mantenerse sano Exercising to Stay Healthy Para estar sano y [place] as, es recomendable hacer ejercicio de intensidad moderada y de intensidad vigorosa. Puede saber si est haciendo ejercicio de intensidad moderada si su corazn comienza a latir ms rpido y su respiracin se vuelve ms rpida, pero an Therapist, occupational. Puede saber que est haciendo ejercicio de intensidad vigorosa si respira con mucha ms dificultad y rapidez, y no puede Pharmacologist una conversacin. Cmo puede beneficiarme el ejercicio? Hacer actividad fsica con regularidad es muy importante. Tiene muchos otros beneficios, como por ejemplo: Mejora el estado fsico general, la flexibilidad y la resistencia. Aumenta la densidad sea. Ayuda a Art gallery manager. Disminuye la Art gallery manager. Aumenta la fuerza y la resistencia muscular. Reduce el estrs y la tensin, la ansiedad, la depresin o la ira. Mejora el estado de salud general. Qu pautas debo seguir mientras hago ejercicio? Hable con el mdico antes de comenzar un programa nuevo de [place] fsica. No haga ejercicio en exceso que pudiera hacer que se lastime, se sienta mareado o tenga dificultad para respirar. Use ropa cmoda y calzado con buen soporte. Beba gran cantidad de agua mientras hace ejercicio para evitar la deshidratacin o los golpes de Airline pilot. Haga ejercicio hasta que se aceleren su respiracin y sus latidos cardacos (intensidad [place]). Con qu frecuencia debera hacer ejercicio? Elija una actividad que disfrute y establezca objetivos realistas. El mdico puede ayudarlo a Event organiser un plan de actividades, diseado de [place] individual y que funcione mejor para usted. Haga actividad fsica habitualmente como se lo haya indicado el mdico. Esto puede incluir: Hacer ejercicios de fortalecimiento muscular dos veces a la semana, como: Levantamiento de pesas. Uso de bandas elsticas de resistencia. Flexiones de  [employer]. Abdominales. Yoga. Realizar ejercicio de [place] cierta intensidad durante una cantidad determinada de [place]. Elija entre estas opciones: Un total de 150 minutos de ejercicio de intensidad moderada cada semana. Un total de 75 minutos de ejercicio de intensidad vigorosa cada semana. Burlene Arnt de ejercicio de intensidad moderada y vigorosa cada semana. Los nios, las mujeres [place], las personas que no han hecho actividad fsica con regularidad, las personas que tienen sobrepeso y los adultos mayores tal vez tengan que consultar a un mdico sobre qu actividades son seguras para Education officer, environmental. Si tiene Dispensing optician, asegrese de Science writer al mdico antes de comenzar un programa de ejercicios nuevo. Cules son algunas ideas para hacer ejercicio? Algunas ideas de ejercicio de intensidad moderada incluyen: Caminar 1 milla (1.6 km) en aproximadamente 15 minutos. Andar en bicicleta. Practicar senderismo. Jugar al golf. Bailar. Gimnasia acutica. Algunas ideas de ejercicio de intensidad vigorosa incluyen: Caminar 4.5 millas (7.2 km) o ms en aproximadamente una hora. Trotar o correr 5 millas (8 km) en aproximadamente una hora. Andar en bicicleta 10 millas (16.1 km) o ms en aproximadamente una hora. Practicar natacin. Practicar patinaje de ruedas o en lnea. Hacer esqu de fondo. Hacer deportes competitivos vigorosos, como ftbol americano, bsquet y ftbol. Saltar la cuerda. Tomar clases de baile [place] son algunas actividades diarias que pueden ayudarme a hacer ejercicio? Trabajar en el jardn, como: Empujar una cortadora de csped. Juntar y embolsar hojas. Lavar el automvil. Empujar un cochecito. Palear nieve. Cuidar el jardn. Lavar las ventanas o los pisos. Cmo puedo ser ms activo en mis actividades diarias? Utilice las Microbiologist del ascensor. Vaya a caminar durante su hora de almuerzo. Si conduce, estacione el automvil ms lejos del trabajo o de  la escuela. Si [place] transporte  pblico, bjese una parada antes y camine el resto del camino. Pngase de pie o camine durante todas las llamadas telefnicas que haga mientras est adentro. Levntese, estrese y camine cada 30 minutos a lo largo del Futures trader. Haga ejercicio con Leisure centre manager. El apoyo para continuar haciendo ejercicio lo ayudar a Pharmacologist una rutina de actividad frecuente. Dnde obtener ms informacin Puede obtener ms informacin acerca de cmo hacer actividad fsica para mantenerse sano en: U.S. Department of Health and [employer] (Departamento de Salud y Servicios Humanos de los Estados Unidos): ThisPath.fi Centers for Disease Control and Prevention Insurance claims handler) (Centros para Air traffic controller y la Prevencin de Event organiser): FootballExhibition.com.br Resumen Hacer actividad fsica con regularidad es muy importante. Mejorar el estado fsico general, la flexibilidad y la resistencia. El ejercicio regular tambin mejorar la salud general. Fawn Kirk a controlar su peso, reducir el estrs y Scientist, clinical (histocompatibility and immunogenetics) la densidad sea. No haga ejercicio en exceso que pudiera hacer que se lastime, se sienta mareado o tenga dificultad para respirar. Hable con el mdico antes de comenzar un programa nuevo de [place] fsica. Esta informacin no tiene Theme park manager el consejo del mdico. Asegrese de hacerle al mdico cualquier pregunta que tenga. Document Revised: 07/20/2020 Document Reviewed: 07/20/2020 Elsevier Patient Education  2024 [employer].

## 2023-03-05 ENCOUNTER — Other Ambulatory Visit: Payer: Self-pay | Admitting: Family Medicine

## 2023-03-05 LAB — CMP14+EGFR
ALT: 14 [IU]/L (ref 0–32)
AST: 19 [IU]/L (ref 0–40)
Albumin: 4.4 g/dL (ref 3.9–4.9)
Alkaline Phosphatase: 126 [IU]/L — ABNORMAL HIGH (ref 44–121)
BUN/Creatinine Ratio: 16 (ref 12–28)
BUN: 12 mg/dL (ref 8–27)
Bilirubin Total: 0.5 mg/dL (ref 0.0–1.2)
CO2: 23 mmol/L (ref 20–29)
Calcium: 9.3 mg/dL (ref 8.7–10.3)
Chloride: 100 mmol/L (ref 96–106)
Creatinine, Ser: 0.74 mg/dL (ref 0.57–1.00)
Globulin, Total: 2.8 g/dL (ref 1.5–4.5)
Glucose: 105 mg/dL — ABNORMAL HIGH (ref 70–99)
Potassium: 4.4 mmol/L (ref 3.5–5.2)
Sodium: 137 mmol/L (ref 134–144)
Total Protein: 7.2 g/dL (ref 6.0–8.5)
eGFR: 91 mL/min/{1.73_m2} (ref >59)

## 2023-03-05 LAB — LP+NON-HDL CHOLESTEROL
Cholesterol, Total: 204 mg/dL — ABNORMAL HIGH (ref 100–199)
HDL: 57 mg/dL (ref >39)
LDL Chol Calc (NIH): 125 mg/dL — ABNORMAL HIGH (ref 0–99)
Total Non-HDL-Chol (LDL+VLDL): 147 mg/dL — ABNORMAL HIGH (ref 0–129)
Triglycerides: 124 mg/dL (ref 0–149)
VLDL Cholesterol Cal: 22 mg/dL (ref 5–40)

## 2023-03-05 MED ORDER — ROSUVASTATIN CALCIUM 20 MG PO TABS
20.0000 mg | ORAL_TABLET | Freq: Every day | ORAL | 1 refills | Status: DC
  Filled 2023-03-05: qty 90, 90d supply, fill #0
  Filled 2023-09-03: qty 90, 90d supply, fill #1

## 2023-03-06 ENCOUNTER — Other Ambulatory Visit: Payer: Self-pay

## 2023-03-10 ENCOUNTER — Other Ambulatory Visit (HOSPITAL_BASED_OUTPATIENT_CLINIC_OR_DEPARTMENT_OTHER): Payer: Self-pay

## 2023-03-11 ENCOUNTER — Other Ambulatory Visit: Payer: Self-pay

## 2023-03-21 ENCOUNTER — Other Ambulatory Visit: Payer: Self-pay

## 2023-04-07 ENCOUNTER — Other Ambulatory Visit: Payer: Self-pay

## 2023-04-07 ENCOUNTER — Other Ambulatory Visit: Payer: Self-pay | Admitting: Family Medicine

## 2023-04-07 MED ORDER — TRUE METRIX BLOOD GLUCOSE TEST VI STRP
ORAL_STRIP | 2 refills | Status: AC
  Filled 2023-04-07: qty 100, 25d supply, fill #0
  Filled 2023-04-29: qty 100, 33d supply, fill #0
  Filled 2023-11-26: qty 100, 33d supply, fill #1
  Filled 2024-02-18: qty 100, 33d supply, fill #2

## 2023-04-08 ENCOUNTER — Other Ambulatory Visit: Payer: Self-pay

## 2023-04-11 ENCOUNTER — Other Ambulatory Visit: Payer: Self-pay

## 2023-04-17 ENCOUNTER — Other Ambulatory Visit: Payer: Self-pay

## 2023-04-21 ENCOUNTER — Other Ambulatory Visit: Payer: Self-pay

## 2023-04-29 ENCOUNTER — Other Ambulatory Visit: Payer: Self-pay

## 2023-04-30 ENCOUNTER — Other Ambulatory Visit: Payer: Self-pay

## 2023-05-02 ENCOUNTER — Other Ambulatory Visit: Payer: Self-pay

## 2023-05-12 ENCOUNTER — Other Ambulatory Visit: Payer: Self-pay

## 2023-05-19 ENCOUNTER — Other Ambulatory Visit: Payer: Self-pay

## 2023-05-29 ENCOUNTER — Other Ambulatory Visit: Payer: Self-pay

## 2023-05-29 ENCOUNTER — Other Ambulatory Visit: Payer: Self-pay | Admitting: Family Medicine

## 2023-05-29 DIAGNOSIS — E1169 Type 2 diabetes mellitus with other specified complication: Secondary | ICD-10-CM

## 2023-05-29 MED ORDER — OZEMPIC (2 MG/DOSE) 8 MG/3ML ~~LOC~~ SOPN
2.0000 mg | PEN_INJECTOR | SUBCUTANEOUS | 1 refills | Status: DC
  Filled 2023-05-29: qty 3, 28d supply, fill #0
  Filled 2023-06-26: qty 3, 28d supply, fill #1
  Filled 2023-07-28: qty 6, 56d supply, fill #2

## 2023-05-30 ENCOUNTER — Other Ambulatory Visit: Payer: Self-pay

## 2023-06-11 ENCOUNTER — Other Ambulatory Visit: Payer: Self-pay

## 2023-06-11 DIAGNOSIS — Z1231 Encounter for screening mammogram for malignant neoplasm of breast: Secondary | ICD-10-CM

## 2023-06-26 ENCOUNTER — Other Ambulatory Visit: Payer: Self-pay

## 2023-07-10 ENCOUNTER — Ambulatory Visit: Payer: Self-pay | Admitting: Hematology and Oncology

## 2023-07-10 ENCOUNTER — Ambulatory Visit
Admission: RE | Admit: 2023-07-10 | Discharge: 2023-07-10 | Disposition: A | Discharge status: Home / Self Care | Payer: Self-pay | Source: Ambulatory Visit | Attending: Obstetrics and Gynecology | Admitting: Obstetrics and Gynecology

## 2023-07-10 VITALS — BP 125/70 | Wt 193.0 lb

## 2023-07-10 DIAGNOSIS — Z1231 Encounter for screening mammogram for malignant neoplasm of breast: Secondary | ICD-10-CM

## 2023-07-10 NOTE — Progress Notes (Signed)
 Ms. Bernece Gall is a 63 y.o. female who presents to [facility] clinic today with no complaints.    Pap Smear: Pap not smear completed today. Last Pap smear was 06/20/2020 and was normal. Per patient has no history of an abnormal Pap smear. Last Pap smear result is available in Epic.   Physical exam: Breasts Breasts symmetrical. No skin abnormalities bilateral breasts. No nipple retraction bilateral breasts. No nipple discharge bilateral breasts. No lymphadenopathy. No lumps palpated bilateral breasts.       Pelvic/Bimanual Pap is not indicated today    Smoking History: Patient has never smoked and was not referred to quit line.    Patient Navigation: Patient education provided. Access to services provided for patient through [facility]CP program. Herma Longest interpreter provided. No transportation provided   Colorectal Cancer Screening: Per patient had a colonoscopy on 10/26/2021 revealing colonic mucosa with prominent lymphoid aggregates. No complaints today.    Breast and Cervical Cancer Risk Assessment: Patient does not have family history of breast cancer, known genetic mutations, or radiation treatment to the chest before age 12. Patient does not have history of cervical dysplasia, immunocompromised, or DES exposure in-utero.  Risk Scores as of Encounter on 07/10/2023     Gregary Lean           5-year 1.16%   Lifetime 4.71%   This patient is Hispana/Latina but has no documented birth country, so the [place] model used data from [place] patients to calculate their risk score. Document a birth country in the Demographics activity for a more accurate score.         Last calculated by Silas, Ansyi K, CMA on 07/10/2023 at  2:48 PM        A: [facility] exam without pap smear No complaints with benign exam.   P: Referred patient to the [facility] for a screening mammogram. Appointment scheduled 07/10/2023.  Baldomero Bone A, NP 07/10/2023 2:50 PM

## 2023-07-10 NOTE — Patient Instructions (Signed)
 Taught Carmen Burns about self breast awareness and gave educational materials to take home. Patient did not need a Pap smear today due to last Pap smear was in 06/20/2020 per patient. Told patient about free cervical cancer screenings to receive a Pap smear if would like one next year. Let her know [facility] will cover Pap smears every 3 years unless has a history of abnormal Pap smears. Referred patient to the [facility] for diagnostic mammogram. Appointment scheduled for 07/10/2023. Patient aware of appointment and will be there. Let patient know will follow up with her within the next couple weeks with results. Carmen Burns verbalized understanding.  Adelaide Adjutant, NP 3:02 PM

## 2023-07-23 ENCOUNTER — Other Ambulatory Visit: Payer: Self-pay

## 2023-07-28 ENCOUNTER — Other Ambulatory Visit: Payer: Self-pay

## 2023-07-31 ENCOUNTER — Other Ambulatory Visit: Payer: Self-pay

## 2023-08-04 ENCOUNTER — Telehealth: Payer: Self-pay

## 2023-08-04 ENCOUNTER — Other Ambulatory Visit: Payer: Self-pay

## 2023-08-04 NOTE — Telephone Encounter (Signed)
 Received notification from [employer] regarding approval for OZEMPIC . Patient assistance approved from 07/27/2023 to 07/26/2024.  Medication will ship to [address], [place], [place] [address]  Pt ID: [id number]  Company phone: [phone]

## 2023-08-15 ENCOUNTER — Other Ambulatory Visit: Payer: Self-pay

## 2023-09-03 ENCOUNTER — Encounter: Payer: Self-pay | Admitting: Family Medicine

## 2023-09-03 ENCOUNTER — Other Ambulatory Visit: Payer: Self-pay

## 2023-09-03 ENCOUNTER — Ambulatory Visit: Payer: Self-pay | Attending: Family Medicine | Admitting: Family Medicine

## 2023-09-03 VITALS — BP 123/75 | HR 75 | Ht 65.0 in | Wt 196.8 lb

## 2023-09-03 DIAGNOSIS — Z7985 Long-term (current) use of injectable non-insulin antidiabetic drugs: Secondary | ICD-10-CM

## 2023-09-03 DIAGNOSIS — E785 Hyperlipidemia, unspecified: Secondary | ICD-10-CM

## 2023-09-03 DIAGNOSIS — I10 Essential (primary) hypertension: Secondary | ICD-10-CM

## 2023-09-03 DIAGNOSIS — E1169 Type 2 diabetes mellitus with other specified complication: Secondary | ICD-10-CM

## 2023-09-03 DIAGNOSIS — I152 Hypertension secondary to endocrine disorders: Secondary | ICD-10-CM

## 2023-09-03 LAB — POCT GLYCOSYLATED HEMOGLOBIN (HGB A1C): HbA1c, POC (controlled diabetic range): 6.1 % (ref 0.0–7.0)

## 2023-09-03 MED ORDER — OZEMPIC (2 MG/DOSE) 8 MG/3ML ~~LOC~~ SOPN
2.0000 mg | PEN_INJECTOR | SUBCUTANEOUS | 1 refills | Status: AC
Start: 2023-09-03 — End: ?
  Filled 2023-09-03: qty 9, 84d supply, fill #0
  Filled 2023-09-25: qty 3, 28d supply, fill #0
  Filled 2023-10-21: qty 3, 28d supply, fill #1
  Filled 2023-11-26: qty 3, 28d supply, fill #2
  Filled 2023-12-19: qty 3, 28d supply, fill #3
  Filled 2024-01-21: qty 3, 28d supply, fill #4
  Filled 2024-02-18: qty 3, 28d supply, fill #5

## 2023-09-03 NOTE — Patient Instructions (Signed)
 Diabetes mellitus y [place] fsica Diabetes Mellitus and Exercise La actividad fsica regular es importante para su salud, especialmente si tiene diabetes mellitus. La actividad fsica no sirve solo para Publishing copy de [place]. Tambin puede ayudar a aumentar la fuerza muscular y la densidad sea y a reducir la Art gallery manager y Development worker, community. Esto puede mejorar el nivel de resistencia y hacer que est en mejor forma y ms flexible. Por qu debo hacer actividad fsica si tengo diabetes? La actividad fsica tiene muchos beneficios para las personas con diabetes. Puede: Ayudar a bajar y Haematologist en la sangre (glucosa) bajo control. Ayudar al cuerpo a responder mejor y a ser ms sensible a la hormona insulina. Reducir la cantidad de insulina que el cuerpo necesita. Disminuir el riesgo de enfermedad cardaca de las siguientes maneras: Reduciendo la cantidad de colesterol "malo" y de triglicridos que tiene en el cuerpo. Aumentando la cantidad de colesterol "bueno" que tiene en el cuerpo. Bajando la presin arterial. Disminuyendo el nivel de glucemia. Cul es mi plan de Doroteo Glassman? El mdico o un especialista capacitado en el tratamiento de la diabetes (educador certificado en el cuidado de la diabetes) puede ayudarle a Event organiser un plan de actividad. Este plan puede ayudarle a encontrar el tipo de actividad fsica que ms le [place]. Tambin puede indicarle con qu frecuencia hacer actividad fsica y durante cunto [place]. Asegrese de lo siguiente: Haga por lo menos 150 minutos semanales de ejercicios de intensidad media o alta. Podra incluir caminatas rpidas, andar en bicicleta o gimnasia [place] acutica. Hacer ejercicios de elongacin y de fortalecimiento al menos 2 veces por semana. Estos pueden incluir yoga o levantar pesas. Reparta la [employer] en al menos 3 das de la [place]. Haga algn tipo de actividad fsica Management consultant. No deje pasar ms de 2 das seguidos sin hacer algn tipo de  [place]. Evite permanecer inactivo durante ms de 30 minutos seguidos. Tmese descansos frecuentes para caminar o estirarse. Elija actividades que disfrute. Establezca objetivos que sepa que puede cumplir. Comience lentamente y aumente la intensidad del ejercicio con el correr del [place]. Cmo controlo la diabetes durante la actividad fsica?  Controlar su nivel de glucemia Controle su glucemia antes y despus de hacer actividad fsica. Si la glucemia es de 240 mg/dl (21.3 mmol/l) o ms antes de comenzar a [employer] fsica, controle la orina para detectar la presencia de cetonas. Estas son sustancias qumicas producidas por el hgado. Si tiene [employer] orina, no haga ejercicio hasta que la glucemia se normalice. Si la glucemia es de 100 mg/dl (5.6 mmol/l) o menos, tome una colacin que tenga entre 15 y 20 gramos de carbohidratos. Controle su glucemia 15 minutos despus de la colacin para asegurarse de que el nivel est por encima de 100 mg/dl (5.6 mmol/l) antes de comenzar a hacer actividad fsica. El riesgo de glucemia baja (hipoglucemia) aumenta mientras hace actividad fsica y despus de hacerla. Conozca los sntomas de esta afeccin y cmo tratarla. Siga estas indicaciones en su casa: Tenga a mano una colacin con carbohidratos para usarla antes, durante y despus de la actividad fsica. Esto puede ayudar a prevenir o a tratar la hipoglucemia. Evite inyectarse insulina en las zonas del cuerpo que trabajarn durante la actividad fsica. Esto puede incluir: Los brazos, cuando vaya a Leisure centre manager al tenis. Las piernas, cuando est por irse a trotar. Haga un seguimiento de sus hbitos de [place] fsica. Esto puede ayudarlos a usted y a su mdico a observar y Recruitment consultant de  actividad. Escriba los siguientes datos: Qu come antes y despus de Radio producer actividad fsica. Los niveles de glucemia antes y despus de hacer ejercicios. El tipo y la cantidad de actividad fsica que  Biomedical engineer. Hable con el mdico antes de comenzar [place]. Es posible que deba hacer lo siguiente: Asegurarse de que la actividad sea segura para usted. Ajustar la insulina, los otros medicamentos y los alimentos que usted consume. Beba agua mientras hace actividad fsica. Esto puede impedir que pierda demasiada agua (deshidratacin). Tambin puede prevenir problemas causados por tener mucho calor en el cuerpo (golpe de calor). Dnde obtener ms informacin American Diabetes Association (Asociacin Estadounidense de la Diabetes): diabetes.org Association of Diabetes Care & Education Specialists (Asociacin de Especialistas en Atencin y Educacin sobre la Diabetes): diabeteseducator.org Esta informacin no tiene Theme park manager el consejo del mdico. Asegrese de hacerle al mdico cualquier pregunta que tenga. Document Revised: 09/24/2021 Document Reviewed: 09/24/2021 Elsevier Patient Education  2024 [employer].

## 2023-09-03 NOTE — Progress Notes (Signed)
 Subjective:  Patient ID: Carmen Burns, female    DOB: February 03, 1961  Age: 63 y.o. MRN: [id number]  CC: Medical Management of Chronic Issues     Discussed the use of AI scribe software for clinical note transcription with the patient, who gave verbal consent to proceed.  History of Present Illness Carmen Burns is a 63 year old female with a history of  type 2 diabetes mellitus, Diabetic neuropathy, Hypertension, Hyperlipidemia, GERD.  who presents with concerns about her diabetes medication.  She is concerned about the potential impact of Ozempic  on her vision which she read about online, although she has not experienced any vision problems. She seeks clarification on when vision changes might occur. She experiences nausea and weight loss, which she attributes to Ozempic . Nausea occurs almost daily but is manageable with water and lemon.  Her medication history includes a recent switch from atorvastatin  to rosuvastatin  for hyperlipidemia after her last set of labs revealed LDL above goal. She is adherent with her antihypertensive.    Past Medical History:  Diagnosis Date   Allergy    seasonal   Anemia 03/26/2011   Diabetes mellitus without complication (HCC)    GERD (gastroesophageal reflux disease)    Hyperlipidemia    Hypertension    Neuromuscular disorder (HCC)    neuropathy   PONV (postoperative nausea and vomiting)    Refusal of blood transfusions as patient is Jehovah's Witness     Past Surgical History:  Procedure Laterality Date   APPENDECTOMY     COLONOSCOPY     DILITATION & CURRETTAGE/HYSTROSCOPY WITH NOVASURE ABLATION N/A 01/12/2013   Procedure: DILATATION & CURETTAGE/HYSTEROSCOPY WITH NOVASURE ABLATION;  Surgeon: Julianne Octave, MD;  Location: [facility];  Service: Gynecology;  Laterality: N/A;   POLYPECTOMY      Family History  Problem Relation Age of Onset   Diabetes Father    Stroke Sister    Diabetes Brother    Hypertension  Brother    Diabetes Brother    Diabetes Brother    Diabetes Brother    Diabetes Brother    Colon cancer Neg Hx    Breast cancer Neg Hx    Rectal cancer Neg Hx    Stomach cancer Neg Hx    Esophageal cancer Neg Hx    Colon polyps Neg Hx     Social History   Socioeconomic History   Marital status: Married    Spouse name: Not on file   Number of children: 4   Years of education: Not on file   Highest education level: 9th grade  Occupational History   Not on file  Tobacco Use   Smoking status: Never   Smokeless tobacco: Never  Vaping Use   Vaping status: Never Used  Substance and Sexual Activity   Alcohol use: Yes    Alcohol/week: 1.0 standard drink of alcohol    Types: 1 Glasses of wine per week    Comment: socially   Drug use: No   Sexual activity: Yes    Birth control/protection: Surgical    Comment: husband vasectomy  Other Topics Concern   Not on file  Social History Narrative   Not on file   Social Drivers of Health   Financial Resource Strain: Low Risk  (03/04/2023)   Overall Financial Resource Strain (CARDIA)    Difficulty of Paying Living Expenses: Not very hard  Food Insecurity: Food Insecurity Present (07/10/2023)   Hunger Vital Sign    Worried About Running Out  of Food in the Last Year: Sometimes true    Ran Out of Food in the Last Year: Sometimes true  Transportation Needs: No Transportation Needs (07/10/2023)   PRAPARE - Administrator, Civil Service (Medical): No    Lack of Transportation (Non-Medical): No  Physical Activity: Insufficiently Active (03/04/2023)   Exercise Vital Sign    Days of Exercise per Week: 2 days    Minutes of Exercise per Session: 30 min  Stress: No Stress Concern Present (03/04/2023)   [employer] of Occupational Health - Occupational Stress Questionnaire    Feeling of Stress : Not at all  Social Connections: Socially Integrated (03/04/2023)   Social Connection and Isolation Panel [NHANES]    Frequency of  Communication with Friends and Family: More than three times a week    Frequency of Social Gatherings with Friends and Family: More than three times a week    Attends Religious Services: More than 4 times per year    Active Member of [employer] or Organizations: Yes    Attends Engineer, structural: More than 4 times per year    Marital Status: Married    Allergies  Allergen Reactions   Penicillins Itching and Rash    Outpatient Medications Prior to Visit  Medication Sig Dispense Refill   Blood Glucose Monitoring Suppl (TRUE METRIX METER) w/Device KIT USE TO CHECK BLOOD SUGAR THREE TIMES DAILY AND AT BEDTIME 1 kit 0   cetirizine  (ZYRTEC ) 10 MG tablet Take 1 tablet (10 mg total) by mouth daily. 30 tablet 3   cyanocobalamin  (VITAMIN B12) 1000 MCG tablet Take 1 tablet (1,000 mcg total) by mouth daily. 130 tablet 1   gabapentin  (NEURONTIN ) 300 MG capsule Take 2 capsules (600 mg total) by mouth at bedtime. 180 capsule 1   glucose blood (TRUE METRIX BLOOD GLUCOSE TEST) test strip USE TO CHECK BLOOD SUGAR THREE TIMES DAILY AND AT BEDTIME 100 each 2   lisinopril -hydrochlorothiazide  (ZESTORETIC ) 10-12.5 MG tablet TAKE 1 TABLET BY MOUTH DAILY. 90 tablet 1   meloxicam  (MOBIC ) 7.5 MG tablet Take 1 tablet (7.5 mg total) by mouth daily. 30 tablet 1   olopatadine  (PATANOL) 0.1 % ophthalmic solution Place 1 drop into both eyes 2 (two) times daily. 5 mL 1   pantoprazole  (PROTONIX ) 40 MG tablet Take 1 tablet (40 mg total) by mouth daily. 90 tablet 1   Psyllium (METAMUCIL) 28.3 % POWD metamucil s/f s/text orange 30 dose 6.1 oz  DISSOLVE ONE TEASPOONFUL IN EACH NOSTRIL EIGHT OUNCE OF WATER AND BEBER 1-3 VECES AL DIA     rosuvastatin  (CRESTOR ) 20 MG tablet Take 1 tablet (20 mg total) by mouth daily. 90 tablet 1   Semaglutide , 2 MG/DOSE, (OZEMPIC , 2 MG/DOSE,) 8 MG/3ML SOPN Inject 2 mg as directed once a week. 9 mL 1   TRUEPLUS LANCETS 28G MISC USE TO CHECK BLOOD SUGAR THREE TIMES DAILY AND AT BEDTIME 100  each 2   No facility-administered medications prior to visit.     ROS Review of Systems  Constitutional:  Negative for activity change and appetite change.  HENT:  Negative for sinus pressure and sore throat.   Respiratory:  Negative for chest tightness, shortness of breath and wheezing.   Cardiovascular:  Negative for chest pain and palpitations.  Gastrointestinal:  Positive for nausea. Negative for abdominal distention, abdominal pain and constipation.  Genitourinary: Negative.   Musculoskeletal: Negative.   Psychiatric/Behavioral:  Negative for behavioral problems and dysphoric mood.     Objective:  BP 123/75   Pulse 75   Ht 5' 5 (1.651 m)   Wt 196 lb 12.8 oz (89.3 kg)   LMP 06/20/2015 Comment: no chance pregnant,husband has had vasectomy pt sates  SpO2 100%   BMI 32.75 kg/m      09/03/2023    9:28 AM 07/10/2023    2:42 PM 03/04/2023   10:56 AM  BP/Weight  Systolic BP 123 125 114  Diastolic BP 75 70 77  Wt. (Lbs) 196.8 193 194.6  BMI 32.75 kg/m2 32.12 kg/m2 32.38 kg/m2      Physical Exam Constitutional:      Appearance: She is well-developed.  Cardiovascular:     Rate and Rhythm: Normal rate.     Heart sounds: Normal heart sounds. No murmur heard. Pulmonary:     Effort: Pulmonary effort is normal.     Breath sounds: Normal breath sounds. No wheezing or rales.  Chest:     Chest wall: No tenderness.  Abdominal:     General: Bowel sounds are normal. There is no distension.     Palpations: Abdomen is soft. There is no mass.     Tenderness: There is no abdominal tenderness.  Musculoskeletal:        General: Normal range of motion.     Right lower leg: No edema.     Left lower leg: No edema.  Neurological:     Mental Status: She is alert and oriented to person, place, and time.  Psychiatric:        Mood and Affect: Mood normal.        Latest Ref Rng & Units 03/04/2023   11:29 AM 08/28/2022   11:53 AM 04/19/2021    3:28 PM  CMP  Glucose 70 - 99 mg/dL  161  096  045   BUN 8 - 27 mg/dL 12  12  12    Creatinine 0.57 - 1.00 mg/dL 4.09  8.11  9.14   Sodium 134 - 144 mmol/L 137  139  137   Potassium 3.5 - 5.2 mmol/L 4.4  4.1  4.1   Chloride 96 - 106 mmol/L 100  104  101   CO2 20 - 29 mmol/L 23  23    Calcium  8.7 - 10.3 mg/dL 9.3  9.4  78.2   Total Protein 6.0 - 8.5 g/dL 7.2  7.1  7.3   Total Bilirubin 0.0 - 1.2 mg/dL 0.5  0.5  0.5   Alkaline Phos 44 - 121 IU/L 126  106  139   AST 0 - 40 IU/L 19  20  21    ALT 0 - 32 IU/L 14  20      Lipid Panel     Component Value Date/Time   CHOL 204 (H) 03/04/2023 1129   TRIG 124 03/04/2023 1129   HDL 57 03/04/2023 1129   CHOLHDL 3.0 12/21/2020 1200   CHOLHDL 3.2 01/19/2016 1043   VLDL 31 (H) 01/19/2016 1043   LDLCALC 125 (H) 03/04/2023 1129    CBC    Component Value Date/Time   WBC 6.9 04/19/2021 1528   WBC 8.4 03/22/2015 0918   RBC 4.90 04/19/2021 1528   RBC 4.61 03/22/2015 0918   HGB 14.5 04/19/2021 1528   HCT 43.4 04/19/2021 1528   PLT 217 04/19/2021 1528   MCV 89 04/19/2021 1528   MCH 29.6 04/19/2021 1528   MCH 29.9 03/22/2015 0918   MCHC 33.4 04/19/2021 1528   MCHC 32.9 03/22/2015 0918   RDW 12.2 04/19/2021 1528  LYMPHSABS 2.6 04/19/2021 1528   MONOABS 0.6 03/22/2015 0918   EOSABS 0.1 04/19/2021 1528   BASOSABS 0.1 04/19/2021 1528    Lab Results  Component Value Date   HGBA1C 6.1 09/03/2023    Lab Results  Component Value Date   HGBA1C 6.1 09/03/2023   HGBA1C 6.3 03/04/2023   HGBA1C 6.6 08/28/2022      1. Type 2 diabetes mellitus with other specified complication, without long-term current use of insulin  (HCC) (Primary) Controlled with A1c of 6.1 Offered to reduce dose of Ozempic  due to nausea but she would like to remain on current dose. Reassured regarding visual side effects and she has no symptoms at this time.  Advised to notify me if symptoms occur. Continue current regimen Counseled on Diabetic diet, my plate method, 098 minutes of moderate intensity  exercise/week Blood sugar logs with fasting goals of 80-120 mg/dl, random of less than 119 and in the event of sugars less than 60 mg/dl or greater than 147 mg/dl encouraged to notify the clinic. Advised on the need for annual eye exams, annual foot exams, Pneumonia vaccine. - POCT glycosylated hemoglobin (Hb A1C) - Microalbumin / creatinine urine ratio - LP+Non-HDL Cholesterol - CMP14+EGFR - Semaglutide , 2 MG/DOSE, (OZEMPIC , 2 MG/DOSE,) 8 MG/3ML SOPN; Inject 2 mg as directed once a week.  Dispense: 9 mL; Refill: 1  2. Hypertension associated with diabetes (HCC) Controlled Continue current regimen Counseled on blood pressure goal of less than 130/80, low-sodium, DASH diet, medication compliance, 150 minutes of moderate intensity exercise per week. Discussed medication compliance, adverse effects.   3. Hyperlipidemia associated with type 2 diabetes mellitus (HCC) Controlled Continue Crestor  Low-cholesterol diet   No orders of the defined types were placed in this encounter.   Follow-up: No follow-ups on file.       Joaquin Mulberry, MD, FAAFP. [facility] [place], [place] [phone]   09/03/2023, 9:38 AM

## 2023-09-04 LAB — CMP14+EGFR
ALT: 11 IU/L (ref 0–32)
AST: 14 IU/L (ref 0–40)
Albumin: 4.1 g/dL (ref 3.9–4.9)
Alkaline Phosphatase: 123 IU/L — ABNORMAL HIGH (ref 44–121)
BUN/Creatinine Ratio: 16 (ref 12–28)
BUN: 13 mg/dL (ref 8–27)
Bilirubin Total: 0.3 mg/dL (ref 0.0–1.2)
CO2: 20 mmol/L (ref 20–29)
Calcium: 9.7 mg/dL (ref 8.7–10.3)
Chloride: 105 mmol/L (ref 96–106)
Creatinine, Ser: 0.81 mg/dL (ref 0.57–1.00)
Globulin, Total: 3.2 g/dL (ref 1.5–4.5)
Glucose: 106 mg/dL — ABNORMAL HIGH (ref 70–99)
Potassium: 4.6 mmol/L (ref 3.5–5.2)
Sodium: 144 mmol/L (ref 134–144)
Total Protein: 7.3 g/dL (ref 6.0–8.5)
eGFR: 81 mL/min/{1.73_m2} (ref >59)

## 2023-09-04 LAB — LP+NON-HDL CHOLESTEROL
Cholesterol, Total: 200 mg/dL — ABNORMAL HIGH (ref 100–199)
HDL: 57 mg/dL (ref >39)
LDL Chol Calc (NIH): 122 mg/dL — ABNORMAL HIGH (ref 0–99)
Total Non-HDL-Chol (LDL+VLDL): 143 mg/dL — ABNORMAL HIGH (ref 0–129)
Triglycerides: 117 mg/dL (ref 0–149)
VLDL Cholesterol Cal: 21 mg/dL (ref 5–40)

## 2023-09-04 LAB — MICROALBUMIN / CREATININE URINE RATIO

## 2023-09-05 ENCOUNTER — Other Ambulatory Visit: Payer: Self-pay

## 2023-09-05 ENCOUNTER — Ambulatory Visit: Payer: Self-pay | Admitting: Family Medicine

## 2023-09-05 MED ORDER — ROSUVASTATIN CALCIUM 40 MG PO TABS
40.0000 mg | ORAL_TABLET | Freq: Every day | ORAL | 1 refills | Status: DC
  Filled 2023-09-05: qty 90, 90d supply, fill #0
  Filled 2023-12-19: qty 90, 90d supply, fill #1

## 2023-09-11 ENCOUNTER — Other Ambulatory Visit: Payer: Self-pay

## 2023-09-25 ENCOUNTER — Other Ambulatory Visit: Payer: Self-pay

## 2023-10-21 ENCOUNTER — Other Ambulatory Visit: Payer: Self-pay

## 2023-10-28 ENCOUNTER — Other Ambulatory Visit: Payer: Self-pay

## 2023-11-17 ENCOUNTER — Other Ambulatory Visit: Payer: Self-pay

## 2023-11-26 ENCOUNTER — Other Ambulatory Visit: Payer: Self-pay | Admitting: Family Medicine

## 2023-11-26 ENCOUNTER — Other Ambulatory Visit: Payer: Self-pay

## 2023-11-26 DIAGNOSIS — E118 Type 2 diabetes mellitus with unspecified complications: Secondary | ICD-10-CM

## 2023-11-27 ENCOUNTER — Other Ambulatory Visit: Payer: Self-pay

## 2023-11-27 NOTE — Telephone Encounter (Signed)
 Requested Prescriptions  Refused Prescriptions Disp Refills   glipiZIDE  (GLUCOTROL ) 5 MG tablet 30 tablet 3    Sig: Take 0.5 tablets (2.5 mg total) by mouth 2 (two) times daily before a meal.     Endocrinology:  Diabetes - Sulfonylureas Passed - 11/27/2023 10:32 AM      Passed - HBA1C is between 0 and 7.9 and within 180 days    HbA1c, POC (controlled diabetic range)  Date Value Ref Range Status  09/03/2023 6.1 0.0 - 7.0 % Final         Passed - Cr in normal range and within 360 days    Creat  Date Value Ref Range Status  01/19/2016 0.88 0.50 - 1.05 mg/dL Final    Comment:      For patients > or = 65 years of age: The upper reference limit for Creatinine is approximately 13% higher for people identified as African-American.      Creatinine, Ser  Date Value Ref Range Status  09/03/2023 0.81 0.57 - 1.00 mg/dL Final   Creatinine, Urine  Date Value Ref Range Status  01/19/2016 60 20 - 320 mg/dL Final         Passed - Valid encounter within last 6 months    Recent Outpatient Visits           2 months ago Type 2 diabetes mellitus with other specified complication, without long-term current use of insulin  (HCC)   [facility] [place] Delbert Clam, MD   8 months ago Type 2 diabetes mellitus with other specified complication, without long-term current use of insulin  [facility])   [facility] [place] Delbert Clam, MD   1 year ago Type 2 diabetes mellitus with other specified complication, without long-term current use of insulin  [facility])   [facility] [place]. [facility] Delbert Clam, MD   1 year ago Type 2 diabetes mellitus with other specified complication, without long-term current use of insulin  [facility])   [facility] [place]. [facility] Delbert Clam, MD   1 year ago Type 2 diabetes  mellitus with other specified complication, without long-term current use of insulin  [facility])   [facility] [place]. [facility] Delbert Clam, MD

## 2023-12-15 ENCOUNTER — Other Ambulatory Visit: Payer: Self-pay

## 2023-12-19 ENCOUNTER — Other Ambulatory Visit: Payer: Self-pay | Admitting: Family Medicine

## 2023-12-19 ENCOUNTER — Other Ambulatory Visit: Payer: Self-pay

## 2023-12-19 DIAGNOSIS — E1159 Type 2 diabetes mellitus with other circulatory complications: Secondary | ICD-10-CM

## 2023-12-19 MED ORDER — LISINOPRIL-HYDROCHLOROTHIAZIDE 10-12.5 MG PO TABS
1.0000 | ORAL_TABLET | Freq: Every day | ORAL | 1 refills | Status: DC
  Filled 2023-12-19: qty 90, 90d supply, fill #0

## 2023-12-23 ENCOUNTER — Other Ambulatory Visit: Payer: Self-pay

## 2024-01-21 ENCOUNTER — Other Ambulatory Visit: Payer: Self-pay

## 2024-01-21 ENCOUNTER — Other Ambulatory Visit: Payer: Self-pay | Admitting: Family Medicine

## 2024-01-21 DIAGNOSIS — K219 Gastro-esophageal reflux disease without esophagitis: Secondary | ICD-10-CM

## 2024-01-21 MED ORDER — PANTOPRAZOLE SODIUM 40 MG PO TBEC
40.0000 mg | DELAYED_RELEASE_TABLET | Freq: Every day | ORAL | 0 refills | Status: DC
  Filled 2024-01-21: qty 90, 90d supply, fill #0

## 2024-01-28 ENCOUNTER — Other Ambulatory Visit: Payer: Self-pay

## 2024-02-18 ENCOUNTER — Other Ambulatory Visit: Payer: Self-pay

## 2024-02-23 ENCOUNTER — Other Ambulatory Visit: Payer: Self-pay

## 2024-03-03 ENCOUNTER — Telehealth: Payer: Self-pay | Admitting: Family Medicine

## 2024-03-03 NOTE — Telephone Encounter (Signed)
°  Pt confirmed appt 12/10(per vr )

## 2024-03-04 ENCOUNTER — Other Ambulatory Visit: Payer: Self-pay

## 2024-03-04 ENCOUNTER — Encounter: Payer: Self-pay | Admitting: Family Medicine

## 2024-03-04 ENCOUNTER — Ambulatory Visit: Payer: Self-pay | Attending: Family Medicine | Admitting: Family Medicine

## 2024-03-04 VITALS — BP 115/76 | HR 82 | Temp 98.3°F | Ht 65.0 in | Wt 196.8 lb

## 2024-03-04 DIAGNOSIS — E1149 Type 2 diabetes mellitus with other diabetic neurological complication: Secondary | ICD-10-CM

## 2024-03-04 DIAGNOSIS — E1140 Type 2 diabetes mellitus with diabetic neuropathy, unspecified: Secondary | ICD-10-CM

## 2024-03-04 DIAGNOSIS — K219 Gastro-esophageal reflux disease without esophagitis: Secondary | ICD-10-CM

## 2024-03-04 DIAGNOSIS — Z23 Encounter for immunization: Secondary | ICD-10-CM

## 2024-03-04 DIAGNOSIS — I10 Essential (primary) hypertension: Secondary | ICD-10-CM

## 2024-03-04 DIAGNOSIS — I152 Hypertension secondary to endocrine disorders: Secondary | ICD-10-CM

## 2024-03-04 DIAGNOSIS — E785 Hyperlipidemia, unspecified: Secondary | ICD-10-CM

## 2024-03-04 DIAGNOSIS — E119 Type 2 diabetes mellitus without complications: Secondary | ICD-10-CM

## 2024-03-04 DIAGNOSIS — Z7985 Long-term (current) use of injectable non-insulin antidiabetic drugs: Secondary | ICD-10-CM

## 2024-03-04 DIAGNOSIS — E1169 Type 2 diabetes mellitus with other specified complication: Secondary | ICD-10-CM

## 2024-03-04 LAB — POCT GLYCOSYLATED HEMOGLOBIN (HGB A1C): HbA1c, POC (controlled diabetic range): 6.2 % (ref 0.0–7.0)

## 2024-03-04 MED ORDER — PANTOPRAZOLE SODIUM 40 MG PO TBEC
40.0000 mg | DELAYED_RELEASE_TABLET | Freq: Every day | ORAL | 1 refills | Status: AC
  Filled 2024-03-04: qty 90, 90d supply, fill #0

## 2024-03-04 MED ORDER — OZEMPIC (2 MG/DOSE) 8 MG/3ML ~~LOC~~ SOPN
2.0000 mg | PEN_INJECTOR | SUBCUTANEOUS | 1 refills | Status: AC
  Filled 2024-03-04: qty 9, 84d supply, fill #0
  Filled 2024-03-15: qty 3, 28d supply, fill #0
  Filled 2024-04-15: qty 3, 28d supply, fill #1

## 2024-03-04 MED ORDER — GABAPENTIN 300 MG PO CAPS
600.0000 mg | ORAL_CAPSULE | Freq: Every day | ORAL | 1 refills | Status: AC
  Filled 2024-03-04: qty 180, 90d supply, fill #0

## 2024-03-04 MED ORDER — LISINOPRIL-HYDROCHLOROTHIAZIDE 10-12.5 MG PO TABS
1.0000 | ORAL_TABLET | Freq: Every day | ORAL | 1 refills | Status: AC
  Filled 2024-03-04 – 2024-03-09 (×2): qty 90, 90d supply, fill #0

## 2024-03-04 MED ORDER — ROSUVASTATIN CALCIUM 40 MG PO TABS
40.0000 mg | ORAL_TABLET | Freq: Every day | ORAL | 1 refills | Status: AC
  Filled 2024-03-04: qty 90, 90d supply, fill #0

## 2024-03-04 NOTE — Progress Notes (Signed)
 Subjective:  Patient ID: Carmen Burns, female    DOB: [birth year]  Age: 63 y.o. MRN: [id number]  CC: Medical Management of Chronic Issues     Discussed the use of AI scribe software for clinical note transcription with the patient, who gave verbal consent to proceed.  History of Present Illness Carmen Burns is a 62 year old female with a history of type 2 diabetes mellitus, Diabetic neuropathy, Hypertension, Hyperlipidemia, GERD who presents for a follow-up visit.  She takes weekly semaglutide  (Ozempic ) for diabetes, with A1c 6.2, slightly higher than 6.1. Her Crestor  dose was increased in June for elevated lipid panel levels. She takes gabapentin  at bedtime for neuropathy and uses meloxicam  as needed for arthritis pain.  She continues pantoprazole  for acid reflux. She has occasional constipation that improves with Metamucil. She has no urinary problems.    Past Medical History:  Diagnosis Date   Allergy    seasonal   Anemia 03/26/2011   Diabetes mellitus without complication (HCC)    GERD (gastroesophageal reflux disease)    Hyperlipidemia    Hypertension    Neuromuscular disorder (HCC)    neuropathy   PONV (postoperative nausea and vomiting)    Refusal of blood transfusions as patient is Jehovah's Witness     Past Surgical History:  Procedure Laterality Date   APPENDECTOMY     COLONOSCOPY     DILITATION & CURRETTAGE/HYSTROSCOPY WITH NOVASURE ABLATION N/A 01/12/2013   Procedure: DILATATION & CURETTAGE/HYSTEROSCOPY WITH NOVASURE ABLATION;  Surgeon: Gloris DELENA Hugger, MD;  Location: [facility];  Service: Gynecology;  Laterality: N/A;   POLYPECTOMY      Family History  Problem Relation Age of Onset   Diabetes Father    Stroke Sister    Diabetes Brother    Hypertension Brother    Diabetes Brother    Diabetes Brother    Diabetes Brother    Diabetes Brother    Colon cancer Neg Hx    Breast cancer Neg Hx    Rectal cancer Neg Hx    Stomach cancer  Neg Hx    Esophageal cancer Neg Hx    Colon polyps Neg Hx     Social History   Socioeconomic History   Marital status: Married    Spouse name: Not on file   Number of children: 4   Years of education: Not on file   Highest education level: 9th grade  Occupational History   Not on file  Tobacco Use   Smoking status: Never   Smokeless tobacco: Never  Vaping Use   Vaping status: Never Used  Substance and Sexual Activity   Alcohol use: Yes    Alcohol/week: 1.0 standard drink of alcohol    Types: 1 Glasses of wine per week    Comment: socially   Drug use: No   Sexual activity: Yes    Birth control/protection: Surgical    Comment: husband vasectomy  Other Topics Concern   Not on file  Social History Narrative   Not on file   Social Drivers of Health   Tobacco Use: Low Risk (09/03/2023)   Patient History    Smoking Tobacco Use: Never    Smokeless Tobacco Use: Never    Passive Exposure: Not on file  Financial Resource Strain: Low Risk (03/04/2023)   Overall Financial Resource Strain (CARDIA)    Difficulty of Paying Living Expenses: Not very hard  Food Insecurity: Food Insecurity Present (07/10/2023)   Hunger Vital Sign    Worried About  Running Out of Food in the Last Year: Sometimes true    Ran Out of Food in the Last Year: Sometimes true  Transportation Needs: No Transportation Needs (07/10/2023)   PRAPARE - Administrator, Civil Service (Medical): No    Lack of Transportation (Non-Medical): No  Physical Activity: Insufficiently Active (03/04/2023)   Exercise Vital Sign    Days of Exercise per Week: 2 days    Minutes of Exercise per Session: 30 min  Stress: No Stress Concern Present (03/04/2023)   [employer] of Occupational Health - Occupational Stress Questionnaire    Feeling of Stress : Not at all  Social Connections: Socially Integrated (03/04/2023)   Social Connection and Isolation Panel    Frequency of Communication with Friends and Family:  More than three times a week    Frequency of Social Gatherings with Friends and Family: More than three times a week    Attends Religious Services: More than 4 times per year    Active Member of Clubs or Organizations: Yes    Attends Banker Meetings: More than 4 times per year    Marital Status: Married  Depression (PHQ2-9): Low Risk (03/04/2024)   Depression (PHQ2-9)    PHQ-2 Score: 1  Alcohol Screen: Low Risk (03/04/2023)   Alcohol Screen    Last Alcohol Screening Score (AUDIT): 2  Housing: Low Risk (03/04/2023)   Housing    Last Housing Risk Score: 0  Utilities: Not At Risk (03/04/2023)   AHC Utilities    Threatened with loss of utilities: No  Health Literacy: Adequate Health Literacy (03/04/2023)   B1300 Health Literacy    Frequency of need for help with medical instructions: Rarely    Allergies[1]  Outpatient Medications Prior to Visit  Medication Sig Dispense Refill   Blood Glucose Monitoring Suppl (TRUE METRIX METER) w/Device KIT USE TO CHECK BLOOD SUGAR THREE TIMES DAILY AND AT BEDTIME 1 kit 0   cetirizine  (ZYRTEC ) 10 MG tablet Take 1 tablet (10 mg total) by mouth daily. 30 tablet 3   cyanocobalamin  (VITAMIN B12) 1000 MCG tablet Take 1 tablet (1,000 mcg total) by mouth daily. 130 tablet 1   glucose blood (TRUE METRIX BLOOD GLUCOSE TEST) test strip USE TO CHECK BLOOD SUGAR THREE TIMES DAILY AND AT BEDTIME 100 each 2   meloxicam  (MOBIC ) 7.5 MG tablet Take 1 tablet (7.5 mg total) by mouth daily. 30 tablet 1   olopatadine  (PATANOL) 0.1 % ophthalmic solution Place 1 drop into both eyes 2 (two) times daily. 5 mL 1   Psyllium (METAMUCIL) 28.3 % POWD metamucil s/f s/text orange 30 dose 6.1 oz  DISSOLVE ONE TEASPOONFUL IN EACH NOSTRIL EIGHT OUNCE OF WATER AND BEBER 1-3 VECES AL DIA     TRUEPLUS LANCETS 28G MISC USE TO CHECK BLOOD SUGAR THREE TIMES DAILY AND AT BEDTIME 100 each 2   gabapentin  (NEURONTIN ) 300 MG capsule Take 2 capsules (600 mg total) by mouth at  bedtime. 180 capsule 1   lisinopril -hydrochlorothiazide  (ZESTORETIC ) 10-12.5 MG tablet TAKE 1 TABLET BY MOUTH DAILY. 90 tablet 1   pantoprazole  (PROTONIX ) 40 MG tablet Take 1 tablet (40 mg total) by mouth daily. 90 tablet 0   rosuvastatin  (CRESTOR ) 40 MG tablet Take 1 tablet (40 mg total) by mouth daily. 90 tablet 1   Semaglutide , 2 MG/DOSE, (OZEMPIC , 2 MG/DOSE,) 8 MG/3ML SOPN Inject 2 mg as directed once a week. 9 mL 1   No facility-administered medications prior to visit.  ROS Review of Systems  Constitutional:  Negative for activity change and appetite change.  HENT:  Negative for sinus pressure and sore throat.   Respiratory:  Negative for chest tightness, shortness of breath and wheezing.   Cardiovascular:  Negative for chest pain and palpitations.  Gastrointestinal:  Positive for constipation. Negative for abdominal distention and abdominal pain.  Genitourinary: Negative.   Musculoskeletal: Negative.   Psychiatric/Behavioral:  Negative for behavioral problems and dysphoric mood.    Objective:  BP 115/76   Pulse 82   Temp 98.3 F (36.8 C) (Oral)   Ht 5' 5 (1.651 m)   Wt 196 lb 12.8 oz (89.3 kg)   LMP 06/20/2015 Comment: no chance pregnant,husband has had vasectomy pt sates  SpO2 98%   BMI 32.75 kg/m      03/04/2024   10:46 AM 09/03/2023    9:28 AM 07/10/2023    2:42 PM  BP/Weight  Systolic BP 115 123 125  Diastolic BP 76 75 70  Wt. (Lbs) 196.8 196.8 193  BMI 32.75 kg/m2 32.75 kg/m2 32.12 kg/m2    Wt Readings from Last 3 Encounters:  03/04/24 196 lb 12.8 oz (89.3 kg)  09/03/23 196 lb 12.8 oz (89.3 kg)  07/10/23 193 lb (87.5 kg)     Physical Exam Constitutional:      Appearance: She is well-developed.  Cardiovascular:     Rate and Rhythm: Normal rate.     Heart sounds: Normal heart sounds. No murmur heard. Pulmonary:     Effort: Pulmonary effort is normal.     Breath sounds: Normal breath sounds. No wheezing or rales.  Chest:     Chest wall: No  tenderness.  Abdominal:     General: Bowel sounds are normal. There is no distension.     Palpations: Abdomen is soft. There is no mass.     Tenderness: There is no abdominal tenderness.  Musculoskeletal:        General: Normal range of motion.     Right lower leg: No edema.     Left lower leg: No edema.  Neurological:     Mental Status: She is alert and oriented to person, place, and time.  Psychiatric:        Mood and Affect: Mood normal.        Latest Ref Rng & Units 09/03/2023    9:57 AM 03/04/2023   11:29 AM 08/28/2022   11:53 AM  CMP  Glucose 70 - 99 mg/dL 893  894  887   BUN 8 - 27 mg/dL 13  12  12    Creatinine 0.57 - 1.00 mg/dL 9.18  9.25  9.23   Sodium 134 - 144 mmol/L 144  137  139   Potassium 3.5 - 5.2 mmol/L 4.6  4.4  4.1   Chloride 96 - 106 mmol/L 105  100  104   CO2 20 - 29 mmol/L 20  23  23    Calcium  8.7 - 10.3 mg/dL 9.7  9.3  9.4   Total Protein 6.0 - 8.5 g/dL 7.3  7.2  7.1   Total Bilirubin 0.0 - 1.2 mg/dL 0.3  0.5  0.5   Alkaline Phos 44 - 121 IU/L 123  126  106   AST 0 - 40 IU/L 14  19  20    ALT 0 - 32 IU/L 11  14  20      Lipid Panel     Component Value Date/Time   CHOL 200 (H) 09/03/2023 0957   TRIG  117 09/03/2023 0957   HDL 57 09/03/2023 0957   CHOLHDL 3.0 12/21/2020 1200   CHOLHDL 3.2 01/19/2016 1043   VLDL 31 (H) 01/19/2016 1043   LDLCALC 122 (H) 09/03/2023 0957    CBC    Component Value Date/Time   WBC 6.9 04/19/2021 1528   WBC 8.4 03/22/2015 0918   RBC 4.90 04/19/2021 1528   RBC 4.61 03/22/2015 0918   HGB 14.5 04/19/2021 1528   HCT 43.4 04/19/2021 1528   PLT 217 04/19/2021 1528   MCV 89 04/19/2021 1528   MCH 29.6 04/19/2021 1528   MCH 29.9 03/22/2015 0918   MCHC 33.4 04/19/2021 1528   MCHC 32.9 03/22/2015 0918   RDW 12.2 04/19/2021 1528   LYMPHSABS 2.6 04/19/2021 1528   MONOABS 0.6 03/22/2015 0918   EOSABS 0.1 04/19/2021 1528   BASOSABS 0.1 04/19/2021 1528    Lab Results  Component Value Date   HGBA1C 6.2 03/04/2024    Lab Results  Component Value Date   HGBA1C 6.2 03/04/2024   HGBA1C 6.1 09/03/2023   HGBA1C 6.3 03/04/2023        Assessment & Plan Type 2 diabetes mellitus with diabetic neuropathy Diabetes well-controlled with A1c of 6.2. Neuropathy managed with gabapentin . - Continue semaglutide  2 mg subcutaneous once a week. - Continue gabapentin  600 mg oral at bedtime. - Ensure annual eye examination for diabetic retinopathy. -Counseled on Diabetic diet, the healthy plate, 849 minutes of moderate intensity exercise/week Blood sugar logs with fasting goals of 80-120 mg/dl, random of less than 819 and in the event of sugars less than 60 mg/dl or greater than 599 mg/dl encouraged to notify the clinic. Advised on the need for annual eye exams, annual foot exams, Pneumonia vaccine.   Hypertension associated with diabetes Controlled Hypertension managed with lisinopril -hydrochlorothiazide . - Continue lisinopril -hydrochlorothiazide  10-12.5 mg oral daily. -Counseled on blood pressure goal of less than 130/80, low-sodium, DASH diet, medication compliance, 150 minutes of moderate intensity exercise per week. Discussed medication compliance, adverse effects.   Hyperlipidemia Cholesterol levels elevated in June; rosuvastatin  dose increased. Lipid panel needed to assess effectiveness. - Ordered lipid panel to assess cholesterol levels. - Continue rosuvastatin  20 mg oral daily. - Low-cholesterol diet  Gastroesophageal reflux disease GERD managed with pantoprazole . - Continue pantoprazole  40 mg oral daily.  General Health Maintenance Due for flu shot and routine lab work. Encounter for administration of vaccine- Administered flu shot. - Ordered comprehensive metabolic panel to assess liver, kidney function, and electrolytes.       Meds ordered this encounter  Medications   gabapentin  (NEURONTIN ) 300 MG capsule    Sig: Take 2 capsules (600 mg total) by mouth at bedtime.    Dispense:  180  capsule    Refill:  1   lisinopril -hydrochlorothiazide  (ZESTORETIC ) 10-12.5 MG tablet    Sig: TAKE 1 TABLET BY MOUTH DAILY.    Dispense:  90 tablet    Refill:  1   pantoprazole  (PROTONIX ) 40 MG tablet    Sig: Take 1 tablet (40 mg total) by mouth daily.    Dispense:  90 tablet    Refill:  1   rosuvastatin  (CRESTOR ) 40 MG tablet    Sig: Take 1 tablet (40 mg total) by mouth daily.    Dispense:  90 tablet    Refill:  1    Dose increase   Semaglutide , 2 MG/DOSE, (OZEMPIC , 2 MG/DOSE,) 8 MG/3ML SOPN    Sig: Inject 2 mg as directed once a week.    Dispense:  9 mL    Refill:  1    Dose increased    Follow-up: Return in about 6 months (around 09/02/2024) for Chronic medical conditions.       Corrina Sabin, MD, FAAFP. [facility] [place], [place] [phone]   03/04/2024, 1:16 PM     [1]  Allergies Allergen Reactions   Penicillins Itching and Rash

## 2024-03-04 NOTE — Patient Instructions (Signed)
 VISIT SUMMARY:  Today, you had a follow-up visit to manage your diabetes, hypertension, high cholesterol, and other health concerns. Your diabetes is well-controlled, and we discussed your current medications and any necessary adjustments. We also reviewed your general health maintenance needs.  YOUR PLAN:  -TYPE 2 DIABETES MELLITUS WITH DIABETIC NEUROPATHY: Type 2 diabetes is a condition where your body does not use insulin  properly, leading to high blood sugar levels. Diabetic neuropathy is nerve damage caused by diabetes. Your diabetes is well-controlled with an A1c of 6.2. Continue taking semaglutide  2 mg once a week and gabapentin  600 mg at bedtime. Please ensure you have an annual eye examination to check for diabetic retinopathy, which is an eye condition that can occur with diabetes.  -HYPERTENSION ASSOCIATED WITH DIABETES: Hypertension, or high blood pressure, is often associated with diabetes. Continue taking lisinopril -hydrochlorothiazide  10-12.5 mg daily to manage your blood pressure.  -HYPERLIPIDEMIA: Hyperlipidemia means you have high levels of cholesterol in your blood. We increased your rosuvastatin  dose in June to help lower your cholesterol. A lipid panel has been ordered to check your cholesterol levels. Continue taking rosuvastatin  20 mg daily.  -GASTROESOPHAGEAL REFLUX DISEASE (GERD): GERD is a condition where stomach acid frequently flows back into the tube connecting your mouth and stomach, causing discomfort. Continue taking pantoprazole  40 mg daily to manage your symptoms.  -GENERAL HEALTH MAINTENANCE: You are due for a flu shot and routine lab work. You received your flu shot today. A comprehensive metabolic panel has been ordered to check your liver and kidney function, as well as your electrolytes.  INSTRUCTIONS:  Please follow up with the ordered lipid panel and comprehensive metabolic panel. Ensure you have an annual eye examination for diabetic retinopathy. Continue  taking your medications as prescribed and monitor your symptoms. If you have any new or worsening symptoms, please contact our office.

## 2024-03-05 ENCOUNTER — Ambulatory Visit: Payer: Self-pay | Admitting: Family Medicine

## 2024-03-05 LAB — CMP14+EGFR
ALT: 12 IU/L (ref 0–32)
AST: 17 IU/L (ref 0–40)
Albumin: 4.3 g/dL (ref 3.9–4.9)
Alkaline Phosphatase: 108 IU/L (ref 49–135)
BUN/Creatinine Ratio: 13 (ref 12–28)
BUN: 11 mg/dL (ref 8–27)
Bilirubin Total: 0.4 mg/dL (ref 0.0–1.2)
CO2: 26 mmol/L (ref 20–29)
Calcium: 9.7 mg/dL (ref 8.7–10.3)
Chloride: 104 mmol/L (ref 96–106)
Creatinine, Ser: 0.85 mg/dL (ref 0.57–1.00)
Globulin, Total: 2.8 g/dL (ref 1.5–4.5)
Glucose: 105 mg/dL — ABNORMAL HIGH (ref 70–99)
Potassium: 4.9 mmol/L (ref 3.5–5.2)
Sodium: 141 mmol/L (ref 134–144)
Total Protein: 7.1 g/dL (ref 6.0–8.5)
eGFR: 76 mL/min/1.73 (ref >59)

## 2024-03-05 LAB — LP+NON-HDL CHOLESTEROL
Cholesterol, Total: 133 mg/dL (ref 100–199)
HDL: 56 mg/dL (ref >39)
LDL Chol Calc (NIH): 60 mg/dL (ref 0–99)
Total Non-HDL-Chol (LDL+VLDL): 77 mg/dL (ref 0–129)
Triglycerides: 91 mg/dL (ref 0–149)
VLDL Cholesterol Cal: 17 mg/dL (ref 5–40)

## 2024-03-09 ENCOUNTER — Other Ambulatory Visit: Payer: Self-pay

## 2024-03-15 ENCOUNTER — Other Ambulatory Visit: Payer: Self-pay

## 2024-04-15 ENCOUNTER — Other Ambulatory Visit: Payer: Self-pay

## 2024-09-02 ENCOUNTER — Ambulatory Visit: Payer: Self-pay | Admitting: Family Medicine
